# Patient Record
Sex: Female | Born: 1935 | Race: White | Hispanic: No | Smoking: Never smoker
Health system: Southern US, Community
[De-identification: ages and names within clinical notes are randomized; demographics above are authoritative.]

## PROBLEM LIST (undated history)

## (undated) DIAGNOSIS — I739 Peripheral vascular disease, unspecified: Secondary | ICD-10-CM

## (undated) DIAGNOSIS — K589 Irritable bowel syndrome without diarrhea: Secondary | ICD-10-CM

## (undated) DIAGNOSIS — M545 Low back pain, unspecified: Secondary | ICD-10-CM

## (undated) DIAGNOSIS — E785 Hyperlipidemia, unspecified: Secondary | ICD-10-CM

## (undated) DIAGNOSIS — K219 Gastro-esophageal reflux disease without esophagitis: Secondary | ICD-10-CM

## (undated) DIAGNOSIS — M48062 Spinal stenosis, lumbar region with neurogenic claudication: Secondary | ICD-10-CM

## (undated) DIAGNOSIS — M549 Dorsalgia, unspecified: Secondary | ICD-10-CM

## (undated) DIAGNOSIS — I10 Essential (primary) hypertension: Secondary | ICD-10-CM

## (undated) DIAGNOSIS — G8929 Other chronic pain: Secondary | ICD-10-CM

## (undated) DIAGNOSIS — K5790 Diverticulosis of intestine, part unspecified, without perforation or abscess without bleeding: Secondary | ICD-10-CM

## (undated) HISTORY — DX: Peripheral vascular disease, unspecified: I73.9

## (undated) HISTORY — DX: Low back pain, unspecified: M54.50

## (undated) HISTORY — DX: Spinal stenosis, lumbar region with neurogenic claudication: M48.062

## (undated) HISTORY — DX: Irritable bowel syndrome, unspecified: K58.9

## (undated) HISTORY — PX: APPENDECTOMY: SHX54

## (undated) HISTORY — DX: Dorsalgia, unspecified: M54.9

## (undated) HISTORY — DX: Other chronic pain: G89.29

## (undated) HISTORY — DX: Diverticulosis of intestine, part unspecified, without perforation or abscess without bleeding: K57.90

## (undated) HISTORY — DX: Gastro-esophageal reflux disease without esophagitis: K21.9

## (undated) HISTORY — DX: Hyperlipidemia, unspecified: E78.5

## (undated) HISTORY — DX: Essential (primary) hypertension: I10

## (undated) HISTORY — DX: Low back pain: M54.5

---

## 1978-05-11 HISTORY — PX: BREAST BIOPSY: SHX20

## 2009-07-31 ENCOUNTER — Ambulatory Visit: Payer: Self-pay | Admitting: Internal Medicine

## 2010-01-29 ENCOUNTER — Ambulatory Visit: Payer: Self-pay | Admitting: Rheumatology

## 2011-04-30 ENCOUNTER — Ambulatory Visit: Payer: Self-pay | Admitting: Internal Medicine

## 2011-05-22 ENCOUNTER — Ambulatory Visit: Payer: Self-pay | Admitting: Rheumatology

## 2011-06-02 ENCOUNTER — Ambulatory Visit: Payer: Self-pay | Admitting: Rheumatology

## 2011-10-19 ENCOUNTER — Ambulatory Visit: Payer: Self-pay | Admitting: Ophthalmology

## 2012-05-25 ENCOUNTER — Ambulatory Visit: Payer: Self-pay | Admitting: Internal Medicine

## 2012-10-17 ENCOUNTER — Ambulatory Visit: Payer: Self-pay | Admitting: Ophthalmology

## 2013-12-04 ENCOUNTER — Ambulatory Visit: Payer: Self-pay | Admitting: Internal Medicine

## 2014-03-22 DIAGNOSIS — I773 Arterial fibromuscular dysplasia: Secondary | ICD-10-CM | POA: Insufficient documentation

## 2014-08-31 NOTE — Op Note (Signed)
PATIENT NAME:  Christina Mooney, Christina Mooney MR#:  [id number] DATE OF BIRTH:  [birth year]  DATE OF PROCEDURE:  10/17/2012  PREOPERATIVE DIAGNOSIS:  Cataract, left eye.    POSTOPERATIVE DIAGNOSIS:  Cataract, left eye.  PROCEDURE PERFORMED:  Extracapsular cataract extraction using phacoemulsification with placement of an Alcon SN6CWS, 22.0-diopter posterior chamber lens, serial #[id number].  SURGEON:  Maylon PeppersSteven A. Maite Burlison, MD  ASSISTANT:  None.  ANESTHESIA:  4% lidocaine and 0.75% Marcaine in a 50/50 mixture with 10 units/mL of Hylenex added, given as a peribulbar.   ANESTHESIOLOGIST:  Dr. Dimple Caseyice.  COMPLICATIONS:  None.  ESTIMATED BLOOD LOSS:  Less than 1 ml.  DESCRIPTION OF PROCEDURE:  The patient was brought to the operating room and given a peribulbar block.  The patient was then prepped and draped in the usual fashion.  The vertical rectus muscles were imbricated using 5-0 silk sutures.  These sutures were then clamped to the sterile drapes as bridle sutures.  A limbal peritomy was performed extending two clock hours and hemostasis was obtained with cautery.  A partial thickness scleral groove was made at the surgical limbus and dissected anteriorly in a lamellar dissection using an Alcon crescent knife.  The anterior chamber was entered supero-temporally with a Superblade and through the lamellar dissection with a 2.6 mm keratome.  DisCoVisc was used to replace the aqueous and a continuous tear capsulorrhexis was carried out.  Hydrodissection and hydrodelineation were carried out with balanced salt and a 27 gauge canula.  The nucleus was rotated to confirm the effectiveness of the hydrodissection.  Phacoemulsification was carried out using a divide-and-conquer technique.  Total ultrasound time was 1 minute and 15 seconds with an average power of 22.7 percent and CDE of 26.39.  Irrigation/aspiration was used to remove the residual cortex.  DisCoVisc was used to inflate the capsule and the internal  incision was enlarged to 3 mm with the crescent knife.  The intraocular lens was folded and inserted into the capsular bag using the AcrySert delivery system.  Irrigation/aspiration was used to remove the residual DisCoVisc.  Miostat was injected into the anterior chamber through the paracentesis track to inflate the anterior chamber and induce miosis.  A tenth of a milliliter of cefuroxime was placed into the anterior chamber via the paracentesis tract. The wound was checked for leaks and none were found. The conjunctiva was closed with cautery and the bridle sutures were removed.  Two drops of 0.3% Vigamox were placed on the eye.   An eye shield was placed on the eye.  The patient was discharged to the recovery room in good condition. ____________________________ Maylon PeppersSteven A. Terianna Peggs, MD sad:sb D: 10/17/2012 13:56:20 ET T: 10/17/2012 14:26:22 ET JOB#: [id number]  cc: Viviann SpareSteven A. Asuzena Weis, MD, <Dictator> Erline LevineSTEVEN A Sherilynn Dieu MD ELECTRONICALLY SIGNED 10/24/2012 13:32

## 2014-09-02 NOTE — Op Note (Signed)
PATIENT NAME:  Christina BarriosCHILDS, Mihaela S MR#:  [id number] DATE OF BIRTH:  1925/07/24  DATE OF PROCEDURE:  10/19/2011  PREOPERATIVE DIAGNOSIS: Cataract, right eye.   POSTOPERATIVE DIAGNOSIS: Cataract, right eye.   PROCEDURE PERFORMED: Extracapsular cataract extraction using phacoemulsification with placement of an Alcon  SN6CWS, 21.5-diopter posterior chamber lens, serial # [id number].   SURGEON: Maylon PeppersSteven A. Shalia Bartko, M.D.   ANESTHESIA: 4% lidocaine and 0.75% Marcaine in a 50-50 mixture with 10 units/mL of Hylenex added, given as a peribulbar.   ANESTHESIOLOGIST: Dr. Dimple Caseyice   COMPLICATIONS: None.   ESTIMATED BLOOD LOSS: Less than 1 mL.   DESCRIPTION OF PROCEDURE:  The patient was brought to the operating room and given a peribulbar block.  The patient was then prepped and draped in the usual fashion.  The vertical rectus muscles were imbricated using 5-0 silk sutures.  These sutures were then clamped to the sterile drapes as bridle sutures.  A limbal peritomy was performed extending two clock hours and hemostasis was obtained with cautery.  A partial thickness scleral groove was made at the surgical limbus and dissected anteriorly in a lamellar dissection using an Alcon crescent knife.  The anterior chamber was entered superonasally with a Superblade and through the lamellar dissection with a 2.6 mm keratome.  DisCoVisc was used to replace the aqueous and a continuous tear capsulorrhexis was carried out.  Hydrodissection and hydrodelineation were carried out with balanced salt and a 27 gauge canula.  The nucleus was rotated to confirm the effectiveness of the hydrodissection.  Phacoemulsification was carried out using a divide-and-conquer technique.  Total ultrasound time was 1 minute and 42 seconds with an average power of 16.7 percent. CDE 28.67.  Irrigation/aspiration was used to remove the residual cortex.  DisCoVisc was used to inflate the capsule and the internal incision was enlarged to 3 mm  with the crescent knife.  The intraocular lens was folded and inserted into the capsular bag using the Acrysert delivery system.  Irrigation/aspiration was used to remove the residual DisCoVisc.  Miostat was injected into the anterior chamber through the paracentesis track to inflate the anterior chamber and induce miosis.  The wound was checked for leaks and none were found. The conjunctiva was closed with cautery and the bridle sutures were removed.  Two drops of 0.3% Vigamox were placed on the eye.   An eye shield was placed on the eye.  The patient was discharged to the recovery room in good condition.  ____________________________ Maylon PeppersSteven A. Amberlea Spagnuolo, MD sad:bjt D: 10/19/2011 13:19:01 ET T: 10/19/2011 14:00:27 ET JOB#: [id number]  cc: Viviann SpareSteven A. Freyja Govea, MD, <Dictator> Erline LevineSTEVEN A Chosen Garron MD ELECTRONICALLY SIGNED 10/26/2011 13:01

## 2014-11-06 ENCOUNTER — Other Ambulatory Visit: Payer: Self-pay | Admitting: Internal Medicine

## 2014-11-06 DIAGNOSIS — Z1231 Encounter for screening mammogram for malignant neoplasm of breast: Secondary | ICD-10-CM

## 2014-12-07 ENCOUNTER — Other Ambulatory Visit: Payer: Self-pay | Admitting: Internal Medicine

## 2014-12-07 ENCOUNTER — Ambulatory Visit
Admission: RE | Admit: 2014-12-07 | Discharge: 2014-12-07 | Disposition: A | Discharge status: Home / Self Care | Payer: Medicare Other | Source: Ambulatory Visit | Attending: Internal Medicine | Admitting: Internal Medicine

## 2014-12-07 DIAGNOSIS — Z1231 Encounter for screening mammogram for malignant neoplasm of breast: Secondary | ICD-10-CM

## 2015-01-07 ENCOUNTER — Other Ambulatory Visit: Payer: Self-pay | Admitting: Physical Medicine and Rehabilitation

## 2015-01-07 DIAGNOSIS — M5416 Radiculopathy, lumbar region: Secondary | ICD-10-CM

## 2015-01-10 ENCOUNTER — Ambulatory Visit
Admission: RE | Admit: 2015-01-10 | Discharge: 2015-01-10 | Disposition: A | Discharge status: Home / Self Care | Payer: Medicare Other | Source: Ambulatory Visit | Attending: Physical Medicine and Rehabilitation | Admitting: Physical Medicine and Rehabilitation

## 2015-01-10 DIAGNOSIS — M79605 Pain in left leg: Secondary | ICD-10-CM | POA: Diagnosis present

## 2015-01-10 DIAGNOSIS — M5136 Other intervertebral disc degeneration, lumbar region: Secondary | ICD-10-CM | POA: Insufficient documentation

## 2015-01-10 DIAGNOSIS — M545 Low back pain: Secondary | ICD-10-CM | POA: Diagnosis present

## 2015-01-10 DIAGNOSIS — M5416 Radiculopathy, lumbar region: Secondary | ICD-10-CM

## 2015-01-10 DIAGNOSIS — M79604 Pain in right leg: Secondary | ICD-10-CM | POA: Diagnosis present

## 2015-01-10 DIAGNOSIS — M4806 Spinal stenosis, lumbar region: Secondary | ICD-10-CM | POA: Diagnosis not present

## 2015-02-05 DIAGNOSIS — M51369 Other intervertebral disc degeneration, lumbar region without mention of lumbar back pain or lower extremity pain: Secondary | ICD-10-CM | POA: Insufficient documentation

## 2015-02-05 DIAGNOSIS — M48062 Spinal stenosis, lumbar region with neurogenic claudication: Secondary | ICD-10-CM | POA: Insufficient documentation

## 2015-02-05 DIAGNOSIS — M5416 Radiculopathy, lumbar region: Secondary | ICD-10-CM | POA: Insufficient documentation

## 2015-02-05 DIAGNOSIS — M5136 Other intervertebral disc degeneration, lumbar region: Secondary | ICD-10-CM | POA: Insufficient documentation

## 2015-03-28 DIAGNOSIS — R002 Palpitations: Secondary | ICD-10-CM | POA: Insufficient documentation

## 2015-12-28 ENCOUNTER — Encounter (INDEPENDENT_AMBULATORY_CARE_PROVIDER_SITE_OTHER): Payer: Self-pay

## 2015-12-28 DIAGNOSIS — I6529 Occlusion and stenosis of unspecified carotid artery: Secondary | ICD-10-CM

## 2015-12-28 DIAGNOSIS — I773 Arterial fibromuscular dysplasia: Secondary | ICD-10-CM | POA: Insufficient documentation

## 2015-12-28 DIAGNOSIS — I700 Atherosclerosis of aorta: Secondary | ICD-10-CM

## 2015-12-28 DIAGNOSIS — K219 Gastro-esophageal reflux disease without esophagitis: Secondary | ICD-10-CM | POA: Insufficient documentation

## 2015-12-28 DIAGNOSIS — E782 Mixed hyperlipidemia: Secondary | ICD-10-CM | POA: Insufficient documentation

## 2015-12-28 DIAGNOSIS — E785 Hyperlipidemia, unspecified: Secondary | ICD-10-CM

## 2015-12-28 DIAGNOSIS — I10 Essential (primary) hypertension: Secondary | ICD-10-CM

## 2016-03-04 ENCOUNTER — Other Ambulatory Visit (INDEPENDENT_AMBULATORY_CARE_PROVIDER_SITE_OTHER): Payer: Self-pay | Admitting: Vascular Surgery

## 2016-03-04 DIAGNOSIS — I672 Cerebral atherosclerosis: Secondary | ICD-10-CM

## 2016-03-04 DIAGNOSIS — I773 Arterial fibromuscular dysplasia: Secondary | ICD-10-CM

## 2016-03-05 ENCOUNTER — Encounter (INDEPENDENT_AMBULATORY_CARE_PROVIDER_SITE_OTHER): Payer: Self-pay | Admitting: Vascular Surgery

## 2016-03-05 ENCOUNTER — Ambulatory Visit (INDEPENDENT_AMBULATORY_CARE_PROVIDER_SITE_OTHER): Payer: Medicare Other

## 2016-03-05 ENCOUNTER — Ambulatory Visit (INDEPENDENT_AMBULATORY_CARE_PROVIDER_SITE_OTHER): Payer: Medicare Other | Admitting: Vascular Surgery

## 2016-03-05 VITALS — BP 167/76 | HR 75 | Resp 16 | Ht 59.0 in | Wt 106.0 lb

## 2016-03-05 DIAGNOSIS — I672 Cerebral atherosclerosis: Secondary | ICD-10-CM

## 2016-03-05 DIAGNOSIS — I773 Arterial fibromuscular dysplasia: Secondary | ICD-10-CM | POA: Diagnosis not present

## 2016-03-05 DIAGNOSIS — I6523 Occlusion and stenosis of bilateral carotid arteries: Secondary | ICD-10-CM

## 2016-03-05 DIAGNOSIS — I10 Essential (primary) hypertension: Secondary | ICD-10-CM

## 2016-03-05 DIAGNOSIS — I700 Atherosclerosis of aorta: Secondary | ICD-10-CM

## 2016-03-18 NOTE — Progress Notes (Signed)
[facility] Admission History & Physical  MRN : [id number]  Christina Mooney is a 82 y.o. (02/11/1926) female who presents with chief complaint of  Chief Complaint  Patient presents with  . Follow-up  .  History of Present Illness: The patient is seen for follow up evaluation of carotid stenosis. The carotid stenosis followed by ultrasound.   The patient denies amaurosis fugax. There is no recent history of TIA symptoms or focal motor deficits. There is no prior documented CVA.  The patient is taking enteric-coated aspirin 81 mg daily.  There is no history of migraine headaches. There is no history of seizures.  The patient has a history of coronary artery disease, no recent episodes of angina or shortness of breath. The patient denies PAD or claudication symptoms. There is a history of hyperlipidemia which is being treated with a statin.    Carotid Duplex done today shows 1-39% stenosis of the right internal carotid artery and 40-59% stenosis of the left internal carotid artery. Changes consistent with F M.D. are again noted.  No change compared to last study in 02/07/2015  Current Meds  Medication Sig  . atorvastatin (LIPITOR) 10 MG tablet Take 10 mg by mouth daily.  . cholecalciferol (VITAMIN D) 1000 units tablet Take 1,000 Units by mouth daily.  . clopidogrel (PLAVIX) 75 MG tablet Take 75 mg by mouth daily.  Marland Kitchen. esomeprazole (NEXIUM) 20 MG capsule Take 20 mg by mouth daily at 12 noon.  . gabapentin (NEURONTIN) 100 MG capsule Take 100 mg by mouth at bedtime.  . metoprolol tartrate (LOPRESSOR) 25 MG tablet Take 25 mg by mouth once.  . valsartan (DIOVAN) 80 MG tablet daily.    Past Medical History:  Diagnosis Date  . Chronic back pain   . Diverticulosis   . GERD (gastroesophageal reflux disease)   . Hyperlipidemia   . Hypertension   . Irritable bowel syndrome   . Lumbago   . Peripheral arterial disease (HCC)   . Spinal stenosis of lumbar region with  neurogenic claudication     Past Surgical History:  Procedure Laterality Date  . APPENDECTOMY    . BREAST BIOPSY Right 1980   neg    Social History Social History  Substance Use Topics  . Smoking status: Never Smoker  . Smokeless tobacco: Never Used  . Alcohol use No    Family History Family History  Problem Relation Age of Onset  . Congestive Heart Failure Mother   . Cerebrovascular Accident Father   No family history of bleeding/clotting disorders, porphyria or autoimmune disease   Allergies  Allergen Reactions  . Codeine      REVIEW OF SYSTEMS (Negative unless checked)  Constitutional: [] Weight loss  [] Fever  [] Chills Cardiac: [] Chest pain   [] Chest pressure   [] Palpitations   [] Shortness of breath when laying flat   [] Shortness of breath with exertion. Vascular:  [] Pain in legs with walking   [] Pain in legs at rest  [] History of DVT   [] Phlebitis   [] Swelling in legs   [] Varicose veins   [] Non-healing ulcers Pulmonary:   [] Uses home oxygen   [] Productive cough   [] Hemoptysis   [] Wheeze  [] COPD   [] Asthma Neurologic:  [x] Dizziness   [] Seizures   [] History of stroke   [] History of TIA  [] Aphasia   [x] Vissual changes   [] Weakness or numbness in arm   [] Weakness or numbness in leg Musculoskeletal:   [] Joint swelling   [] Joint pain   [] Low back  pain Hematologic:  [] Easy bruising  [] Easy bleeding   [] Hypercoagulable state   [] Anemic Gastrointestinal:  [] Diarrhea   [] Vomiting  [] Gastroesophageal reflux/heartburn   [] Difficulty swallowing. Genitourinary:  [] Chronic kidney disease   [] Difficult urination  [] Frequent urination   [] Blood in urine Skin:  [] Rashes   [] Ulcers  Psychological:  [] History of anxiety   []  History of major depression.  Physical Examination  Vitals:   03/05/16 1214 03/05/16 1215  BP: (!) 173/78 (!) 167/76  Pulse: 75   Resp: 16   Weight: 48.1 kg (106 lb)   Height: 4\' 11"  (1.499 m)    Body mass index is 21.41 kg/m. Gen: WD/WN, NAD Head:  [place]/AT, No temporalis wasting.  Ear/Nose/Throat: Hearing grossly intact, nares w/o erythema or drainage, poor dentition Eyes: PER, EOMI, sclera nonicteric.  Neck: Supple, no masses.  No bruit or JVD.  Pulmonary:  Good air movement, clear to auscultation bilaterally, no use of accessory muscles.  Cardiac: RRR, normal S1, S2, no Murmurs. Vascular: left carotid bruit Vessel Right Left  Radial Palpable Palpable  Ulnar Palpable Palpable  Brachial Palpable Palpable  Carotid Palpable Palpable  Gastrointestinal: soft, non-distended. No guarding/no peritoneal signs.  Musculoskeletal: M/S 5/5 throughout.  No deformity or atrophy.  Neurologic: CN 2-12 intact. Pain and light touch intact in extremities.  Symmetrical.  Speech is fluent. Motor exam as listed above. Psychiatric: Judgment intact, Mood & affect appropriate for pt's clinical situation. Dermatologic: No rashes or ulcers noted.  No changes consistent with cellulitis. Lymph : No Cervical lymphadenopathy, no lichenification or skin changes of chronic lymphedema.  CBC No results found for: WBC, HGB, HCT, MCV, PLT  BMET No results found for: NA, K, CL, CO2, GLUCOSE, BUN, CREATININE, CALCIUM, GFRNONAA, GFRAA CrCl cannot be calculated (No order found.).  COAG No results found for: INR, PROTIME  Radiology No results found.  Assessment/Plan 1. Bilateral carotid artery stenosis Recommend:  Given the patient's asymptomatic subcritical stenosis no further invasive testing or surgery at this time.  Duplex ultrasound shows <60% stenosis bilaterally.  Continue antiplatelet therapy as prescribed Continue management of CAD, HTN and Hyperlipidemia Healthy heart diet,  encouraged exercise at least 4 times per week Follow up in 12 months with duplex ultrasound and physical exam based on the stable >50% stenosis of the LICA carotid artery   - VAS US CAROTID; Future  2.Fibromuscular dysplasia (HCC)  Patient remains asymptomatic  Continue  antiplatelet therapy no surgery at this time   - VAS US CAROTID; Future  3. Aortic atherosclerosis (HCC)  Recommend:  The patient has evidence of mild atherosclerosis of the aorta and lower extremities without claudication.  The patient does not voice lifestyle limiting changes at this point in time.  Noninvasive studies do not suggest clinically significant change.  No invasive studies, angiography or surgery at this time The patient should continue walking and begin a more formal exercise program.  The patient should continue antiplatelet therapy and aggressive treatment of the lipid abnormalities  No changes in the patient's medications at this time  The patient should continue wearing graduated compression socks 10-15 mmHg strength to control the mild edema.    4. Essential hypertension, benign Continue antihypertensive medications as already ordered and reviewed, no changes at this time.    Levora DredgeGregory Ercelle Winkles, MD  03/18/2016 1:34 PM

## 2016-10-22 ENCOUNTER — Other Ambulatory Visit: Payer: Self-pay | Admitting: Ophthalmology

## 2016-10-22 DIAGNOSIS — H532 Diplopia: Secondary | ICD-10-CM

## 2016-10-27 ENCOUNTER — Ambulatory Visit
Admission: RE | Admit: 2016-10-27 | Discharge: 2016-10-27 | Disposition: A | Discharge status: Home / Self Care | Payer: Medicare Other | Source: Ambulatory Visit | Attending: Ophthalmology | Admitting: Ophthalmology

## 2016-10-27 DIAGNOSIS — H532 Diplopia: Secondary | ICD-10-CM | POA: Insufficient documentation

## 2016-10-27 DIAGNOSIS — I739 Peripheral vascular disease, unspecified: Secondary | ICD-10-CM | POA: Insufficient documentation

## 2016-10-27 DIAGNOSIS — G319 Degenerative disease of nervous system, unspecified: Secondary | ICD-10-CM | POA: Diagnosis not present

## 2016-10-27 LAB — POCT I-STAT CREATININE: Creatinine, Ser: 0.6 mg/dL (ref 0.44–1.00)

## 2016-10-27 MED ORDER — GADOBENATE DIMEGLUMINE 529 MG/ML IV SOLN
10.0000 mL | Freq: Once | INTRAVENOUS | Status: AC | PRN
  Administered 2016-10-27: 10 mL via INTRAVENOUS

## 2017-02-08 ENCOUNTER — Emergency Department: Payer: Medicare Other

## 2017-02-08 ENCOUNTER — Encounter: Payer: Self-pay | Admitting: Emergency Medicine

## 2017-02-08 ENCOUNTER — Emergency Department
Admission: EM | Admit: 2017-02-08 | Discharge: 2017-02-08 | Disposition: A | Discharge status: Home / Self Care | Payer: Medicare Other | Attending: Student in an Organized Health Care Education/Training Program | Admitting: Student in an Organized Health Care Education/Training Program

## 2017-02-08 DIAGNOSIS — W010XXA Fall on same level from slipping, tripping and stumbling without subsequent striking against object, initial encounter: Secondary | ICD-10-CM | POA: Diagnosis not present

## 2017-02-08 DIAGNOSIS — Y939 Activity, unspecified: Secondary | ICD-10-CM | POA: Insufficient documentation

## 2017-02-08 DIAGNOSIS — S0990XA Unspecified injury of head, initial encounter: Secondary | ICD-10-CM | POA: Insufficient documentation

## 2017-02-08 DIAGNOSIS — Z7902 Long term (current) use of antithrombotics/antiplatelets: Secondary | ICD-10-CM | POA: Insufficient documentation

## 2017-02-08 DIAGNOSIS — Z79899 Other long term (current) drug therapy: Secondary | ICD-10-CM | POA: Diagnosis not present

## 2017-02-08 DIAGNOSIS — Y929 Unspecified place or not applicable: Secondary | ICD-10-CM | POA: Insufficient documentation

## 2017-02-08 DIAGNOSIS — I10 Essential (primary) hypertension: Secondary | ICD-10-CM | POA: Diagnosis not present

## 2017-02-08 DIAGNOSIS — Y999 Unspecified external cause status: Secondary | ICD-10-CM | POA: Insufficient documentation

## 2017-02-08 DIAGNOSIS — S299XXA Unspecified injury of thorax, initial encounter: Secondary | ICD-10-CM | POA: Diagnosis present

## 2017-02-08 DIAGNOSIS — M25562 Pain in left knee: Secondary | ICD-10-CM | POA: Diagnosis not present

## 2017-02-08 DIAGNOSIS — S2231XA Fracture of one rib, right side, initial encounter for closed fracture: Secondary | ICD-10-CM | POA: Insufficient documentation

## 2017-02-08 DIAGNOSIS — W19XXXA Unspecified fall, initial encounter: Secondary | ICD-10-CM

## 2017-02-08 MED ORDER — ACETAMINOPHEN 325 MG PO TABS
650.0000 mg | ORAL_TABLET | Freq: Once | ORAL | Status: AC
  Administered 2017-02-08: 650 mg via ORAL
  Filled 2017-02-08: qty 2

## 2017-02-08 MED ORDER — LIDOCAINE 5 % EX PTCH: 1.0000 | MEDICATED_PATCH | Freq: Two times a day (BID) | CUTANEOUS | 0 refills | Status: AC

## 2017-02-08 MED ORDER — LIDOCAINE 5 % EX PTCH
1.0000 | MEDICATED_PATCH | CUTANEOUS | Status: DC
  Administered 2017-02-08: 1 via TRANSDERMAL
  Filled 2017-02-08: qty 1

## 2017-02-08 MED ORDER — TRAMADOL HCL 50 MG PO TABS: 50.0000 mg | ORAL_TABLET | Freq: Four times a day (QID) | ORAL | 0 refills | Status: AC | PRN

## 2017-02-08 MED ORDER — TRAMADOL HCL 50 MG PO TABS
50.0000 mg | ORAL_TABLET | Freq: Once | ORAL | Status: DC
  Filled 2017-02-08: qty 1

## 2017-02-08 NOTE — ED Provider Notes (Signed)
[facility] Emergency Department Provider Note    First MD Initiated Contact with Patient 02/08/17 1541     (approximate)  I have reviewed the triage vital signs and the nursing notes.   HISTORY  Chief Complaint Fall    HPI Christina Mooney is a 83 y.o. female presents with chief complaint of anterior chest wall pain head injury and left knee pain that occurred after mechanical fall. Patient states that she was tripped when she was getting out of the car. Feels that her shoes got tripped up from under her. Denied any chest pain or palpitations prior to the fall. There is no LOC. No blurry vision. States that she does have severe pain when she takes a deep breath.   Past Medical History:  Diagnosis Date  . Chronic back pain   . Diverticulosis   . GERD (gastroesophageal reflux disease)   . Hyperlipidemia   . Hypertension   . Irritable bowel syndrome   . Lumbago   . Peripheral arterial disease (HCC)   . Spinal stenosis of lumbar region with neurogenic claudication    Family History  Problem Relation Age of Onset  . Congestive Heart Failure Mother   . Cerebrovascular Accident Father    Past Surgical History:  Procedure Laterality Date  . APPENDECTOMY    . BREAST BIOPSY Right 1980   neg   Patient Active Problem List   Diagnosis Date Noted  . GERD (gastroesophageal reflux disease) 12/28/2015  . Carotid artery stenosis 12/28/2015  . Aortic atherosclerosis (HCC) 12/28/2015  . Hyperlipidemia 12/28/2015  . Essential hypertension, benign 12/28/2015  . Fibromuscular dysplasia (HCC) 12/28/2015      Prior to Admission medications   Medication Sig Start Date End Date Taking? Authorizing Provider  atorvastatin (LIPITOR) 10 MG tablet Take 10 mg by mouth daily.    [provider]  cholecalciferol (VITAMIN D) 1000 units tablet Take 1,000 Units by mouth daily.    [provider]  clopidogrel (PLAVIX) 75 MG tablet Take 75 mg by mouth  daily.    [provider]  esomeprazole (NEXIUM) 20 MG capsule Take 20 mg by mouth daily at 12 noon.    [provider]  gabapentin (NEURONTIN) 100 MG capsule Take 100 mg by mouth at bedtime.    [provider]  losartan (COZAAR) 50 MG tablet Take 50 mg by mouth daily.    [provider]  metoprolol tartrate (LOPRESSOR) 25 MG tablet Take 25 mg by mouth once.    [provider]  valsartan (DIOVAN) 80 MG tablet daily. 03/03/16   [provider]    Allergies Codeine; Garlic; Milk-related compounds; and Sorbitol    Social History Social History  Substance Use Topics  . Smoking status: Never Smoker  . Smokeless tobacco: Never Used  . Alcohol use No    Review of Systems Patient denies headaches, rhinorrhea, blurry vision, numbness, shortness of breath, chest pain, edema, cough, abdominal pain, nausea, vomiting, diarrhea, dysuria, fevers, rashes or hallucinations unless otherwise stated above in HPI. ____________________________________________   PHYSICAL EXAM:  VITAL SIGNS: Vitals:   02/08/17 1544  BP: (!) 177/78  Pulse: 86  Resp: 16  Temp: 97.8 F (36.6 C)  SpO2: 100%    Constitutional: Alert and oriented. Well appearing and in no acute distress. Eyes: Conjunctivae are normal.  Head: Atraumatic. Nose: No congestion/rhinnorhea. Mouth/Throat: Mucous membranes are moist.   Neck: No stridor. Painless ROM.  Cardiovascular: Normal rate, regular rhythm. Grossly normal heart  sounds.  Good peripheral circulation. Respiratory: Normal respiratory effort.  No retractions. Lungs CTAB.  + anterior chest wall pain Gastrointestinal: Soft and nontender. No distention. No abdominal bruits. No CVA tenderness. Musculoskeletal: No lower extremity tenderness nor edema.  No joint effusions. ttp of left patella without deformity or effusion Neurologic:  Normal speech and language. No gross focal neurologic deficits are appreciated. No facial  droop Skin:  Skin is warm, dry and intact. No rash noted. Psychiatric: Mood and affect are normal. Speech and behavior are normal.  ____________________________________________   LABS (all labs ordered are listed, but only abnormal results are displayed)  No results found for this or any previous visit (from the past 24 hour(s)). ____________________________________________ ____________________________________________  RADIOLOGY  I personally reviewed all radiographic images ordered to evaluate for the above acute complaints and reviewed radiology reports and findings.  These findings were personally discussed with the patient.  Please see medical record for radiology report.  ____________________________________________   PROCEDURES  Procedure(s) performed:  Procedures    Critical Care performed: no ____________________________________________   INITIAL IMPRESSION / ASSESSMENT AND PLAN / ED COURSE  Pertinent labs & imaging results that were available during my care of the patient were reviewed by me and considered in my medical decision making (see chart for details).  DDX: iph, sah, fracture, ptx, hemothorax  Christina Mooney is a 82 y.o. who presents to the ED with traumatic injury as described above. She is otherwise well-appearing. Describes episodes. Mechanical fall. CT imaging and radiographs ordered for the above differential show evidence of nondisplaced rib fracture but no evidence of pneumothorax or hemothorax. No evidence of acute intracranial abnormality evidence of cervical fracture. Patient provided was considered spirometer and pain management. Discussed strict return precautions.      ____________________________________________   FINAL CLINICAL IMPRESSION(S) / ED DIAGNOSES  Final diagnoses:  Fall, initial encounter  Closed fracture of one rib of right side, initial encounter      NEW MEDICATIONS STARTED DURING THIS VISIT:  New Prescriptions     No medications on file     Note:  This document was prepared using Dragon voice recognition software and may include unintentional dictation errors.    Willy Eddy, MD 02/08/17 2022

## 2017-02-08 NOTE — ED Notes (Signed)

## 2017-02-08 NOTE — ED Triage Notes (Signed)
Pt presents to ED [facility] via EMS from [facility] with a mechanical fall when the patient tripped getting out of the car; fall occurred around 1500 today; pt c/o pain in the left knee, left side of the face and sternal area; pt states pain most severe when trying to catch a deep breath; pt is awake, alert and oriented x4.

## 2017-03-11 ENCOUNTER — Ambulatory Visit (INDEPENDENT_AMBULATORY_CARE_PROVIDER_SITE_OTHER): Payer: Medicare Other

## 2017-03-11 ENCOUNTER — Encounter (INDEPENDENT_AMBULATORY_CARE_PROVIDER_SITE_OTHER): Payer: Self-pay

## 2017-03-11 ENCOUNTER — Encounter (INDEPENDENT_AMBULATORY_CARE_PROVIDER_SITE_OTHER): Payer: Self-pay | Admitting: Vascular Surgery

## 2017-03-11 ENCOUNTER — Ambulatory Visit (INDEPENDENT_AMBULATORY_CARE_PROVIDER_SITE_OTHER): Payer: Medicare Other | Admitting: Vascular Surgery

## 2017-03-11 VITALS — BP 139/65 | HR 71 | Resp 14 | Ht 59.0 in | Wt 102.0 lb

## 2017-03-11 DIAGNOSIS — E785 Hyperlipidemia, unspecified: Secondary | ICD-10-CM | POA: Diagnosis not present

## 2017-03-11 DIAGNOSIS — I6523 Occlusion and stenosis of bilateral carotid arteries: Secondary | ICD-10-CM | POA: Diagnosis not present

## 2017-03-11 DIAGNOSIS — I773 Arterial fibromuscular dysplasia: Secondary | ICD-10-CM

## 2017-03-11 NOTE — Progress Notes (Signed)
Subjective:    Patient ID: Christina Mooney, female    DOB: [birth year], 83 y.o.   MRN: [id number] Chief Complaint  Patient presents with  . Carotid    6 month carotid follow up   Patient presents for a yearly non-invasive study follow up for carotid stenosis. The stenosis has been followed by surveillance duplexes. The patient underwent a bilateral carotid duplex scan which showed no change from the previous exam on 03/05/16. Duplex is stable at 1-39% ICA stenosis bilaterally. The patient denies experiencing Amaurosis Fugax, TIA like symptoms or focal motor deficits.    Review of Systems  Constitutional: Negative.   HENT: Negative.   Eyes: Negative.   Respiratory: Negative.   Cardiovascular: Negative.   Gastrointestinal: Negative.   Endocrine: Negative.   Genitourinary: Negative.   Musculoskeletal: Negative.   Skin: Negative.   Allergic/Immunologic: Negative.   Neurological: Negative.   Hematological: Negative.   Psychiatric/Behavioral: Negative.       Objective:   Physical Exam  Constitutional: She is oriented to person, place, and time. She appears well-developed and well-nourished.  HENT:  Head: Normocephalic and atraumatic.  Eyes: Pupils are equal, round, and reactive to light. Conjunctivae are normal.  Neck: Normal range of motion.  No carotid bruits noted on exam  Cardiovascular: Normal rate, regular rhythm, normal heart sounds and intact distal pulses.   Pulses:      Radial pulses are 2+ on the right side, and 2+ on the left side.  Pulmonary/Chest: Effort normal.  Musculoskeletal: Normal range of motion. She exhibits no edema.  Neurological: She is alert and oriented to person, place, and time.  Skin: Skin is warm and dry. She is not diaphoretic.  Psychiatric: She has a normal mood and affect. Her behavior is normal. Judgment and thought content normal.  Vitals reviewed.  BP 139/65 (BP Location: Left Arm)   Pulse 71   Resp 14   Ht 4\' 11"  (1.499 m)   Wt 102  lb (46.3 kg)   BMI 20.60 kg/m   Past Medical History:  Diagnosis Date  . Chronic back pain   . Diverticulosis   . GERD (gastroesophageal reflux disease)   . Hyperlipidemia   . Hypertension   . Irritable bowel syndrome   . Lumbago   . Peripheral arterial disease (HCC)   . Spinal stenosis of lumbar region with neurogenic claudication    Social History   Social History  . Marital status: Widowed    Spouse name: N/A  . Number of children: N/A  . Years of education: N/A   Occupational History  . Not on file.   Social History Main Topics  . Smoking status: Never Smoker  . Smokeless tobacco: Never Used  . Alcohol use No  . Drug use: No  . Sexual activity: No   Other Topics Concern  . Not on file   Social History Narrative  . No narrative on file   Past Surgical History:  Procedure Laterality Date  . APPENDECTOMY    . BREAST BIOPSY Right 1980   neg    Family History  Problem Relation Age of Onset  . Congestive Heart Failure Mother   . Cerebrovascular Accident Father    Allergies  Allergen Reactions  . Codeine   . Garlic   . Milk-Related Compounds   . Sorbitol       Assessment & Plan:  Patient presents for a yearly non-invasive study follow up for carotid stenosis. The stenosis has been followed by  surveillance duplexes. The patient underwent a bilateral carotid duplex scan which showed no change from the previous exam on 03/05/16. Duplex is stable at 1-39% ICA stenosis bilaterally. The patient denies experiencing Amaurosis Fugax, TIA like symptoms or focal motor deficits.   1. Bilateral carotid artery stenosis - Stable Studies reviewed with patient. Patient asymptomatic with stable duplex. No intervention at this time. Patient to return in one year for surveillance carotid duplex. Patient to continue medical optimization with ASA and dyslipidemia medication. Patient to remain abstinent of tobacco use. I have discussed with the patient at length the risk  factors for and pathogenesis of atherosclerotic disease and encouraged a healthy diet, regular exercise regimen and blood pressure / glucose control.  Patient was instructed to contact our office in the interim with problems such as arm / leg weakness or numbness, speech / swallowing difficulty or temporary monocular blindness. The patient expresses their understanding.  - VAS US CAROTID; Future  2. Fibromuscular dysplasia (HCC) - Stable Contributing factor to the patient's carotid stenosis The patient's carotid stenosis is stable.  3. Hyperlipidemia, unspecified hyperlipidemia type - Stable Encouraged good control as its slows the progression of atherosclerotic disease  Current Outpatient Prescriptions on File Prior to Visit  Medication Sig Dispense Refill  . atorvastatin (LIPITOR) 10 MG tablet Take 10 mg by mouth daily.    . cholecalciferol (VITAMIN D) 1000 units tablet Take 1,000 Units by mouth daily.    . clopidogrel (PLAVIX) 75 MG tablet Take 75 mg by mouth daily.    Marland Kitchen. esomeprazole (NEXIUM) 20 MG capsule Take 20 mg by mouth daily at 12 noon.    . gabapentin (NEURONTIN) 100 MG capsule Take 100 mg by mouth at bedtime.    . lidocaine (LIDODERM) 5 % Place 1 patch onto the skin every 12 (twelve) hours. Remove & Discard patch within 12 hours or as directed by MD 10 patch 0  . losartan (COZAAR) 50 MG tablet Take 50 mg by mouth daily.    . traMADol (ULTRAM) 50 MG tablet Take 1 tablet (50 mg total) by mouth every 6 (six) hours as needed. 5 tablet 0  . valsartan (DIOVAN) 80 MG tablet daily.     No current facility-administered medications on file prior to visit.     There are no Patient Instructions on file for this visit. No Follow-up on file.   Aalijah Mims A Eero Dini, PA-C

## 2017-12-16 ENCOUNTER — Other Ambulatory Visit (INDEPENDENT_AMBULATORY_CARE_PROVIDER_SITE_OTHER): Payer: Self-pay | Admitting: Vascular Surgery

## 2018-03-11 ENCOUNTER — Encounter (INDEPENDENT_AMBULATORY_CARE_PROVIDER_SITE_OTHER): Payer: Medicare Other

## 2018-03-14 ENCOUNTER — Encounter (INDEPENDENT_AMBULATORY_CARE_PROVIDER_SITE_OTHER): Payer: Medicare Other

## 2018-03-14 ENCOUNTER — Encounter (INDEPENDENT_AMBULATORY_CARE_PROVIDER_SITE_OTHER): Payer: Self-pay | Admitting: Vascular Surgery

## 2018-03-14 ENCOUNTER — Ambulatory Visit (INDEPENDENT_AMBULATORY_CARE_PROVIDER_SITE_OTHER): Payer: Medicare Other | Admitting: Vascular Surgery

## 2018-03-14 ENCOUNTER — Ambulatory Visit (INDEPENDENT_AMBULATORY_CARE_PROVIDER_SITE_OTHER): Payer: Medicare Other

## 2018-03-14 VITALS — BP 145/72 | HR 76 | Resp 18 | Ht 59.0 in | Wt 93.0 lb

## 2018-03-14 DIAGNOSIS — I6523 Occlusion and stenosis of bilateral carotid arteries: Secondary | ICD-10-CM

## 2018-03-14 DIAGNOSIS — K219 Gastro-esophageal reflux disease without esophagitis: Secondary | ICD-10-CM | POA: Diagnosis not present

## 2018-03-14 DIAGNOSIS — E785 Hyperlipidemia, unspecified: Secondary | ICD-10-CM | POA: Diagnosis not present

## 2018-03-14 DIAGNOSIS — I10 Essential (primary) hypertension: Secondary | ICD-10-CM | POA: Diagnosis not present

## 2018-03-14 DIAGNOSIS — Z7982 Long term (current) use of aspirin: Secondary | ICD-10-CM

## 2018-03-14 NOTE — Progress Notes (Signed)
MRN : [id number]  Christina Mooney is a 84 y.o. (Jun 10, 1925) female who presents with chief complaint of  Chief Complaint  Patient presents with  . Follow-up    1 year Carotid check  .  History of Present Illness:   The patient is seen for follow up evaluation of carotid stenosis. The carotid stenosis followed by ultrasound.   The patient denies amaurosis fugax. There is no recent history of TIA symptoms or focal motor deficits. There is no prior documented CVA.  The patient is taking enteric-coated aspirin 81 mg daily.  There is no history of migraine headaches. There is no history of seizures.  The patient has a history of coronary artery disease, no recent episodes of angina or shortness of breath. The patient denies PAD or claudication symptoms. There is a history of hyperlipidemia which is being treated with a statin.   Carotid Duplex done today shows 40-59% stenosis of the right internal carotid artery and 40-59% stenosis of the left internal carotid artery. Changes consistent with F M.D. are again noted.  No change compared to last study in 03/11/2017  Current Meds  Medication Sig  . atorvastatin (LIPITOR) 10 MG tablet Take 10 mg by mouth daily.  . cholecalciferol (VITAMIN D) 1000 units tablet Take 1,000 Units by mouth daily.  . clopidogrel (PLAVIX) 75 MG tablet TAKE 1 TABLET BY MOUTH DAILY  . diazepam (VALIUM) 2 MG tablet Take by mouth.   . esomeprazole (NEXIUM) 20 MG capsule Take 20 mg by mouth daily at 12 noon.  . gabapentin (NEURONTIN) 100 MG capsule Take 100 mg by mouth at bedtime.  . metoprolol succinate (TOPROL-XL) 25 MG 24 hr tablet Take 12.5 mg by mouth daily.  . valsartan (DIOVAN) 80 MG tablet daily.  . [DISCONTINUED] [place] alk-PHENObarbital (DONNATAL) 16.2 MG tablet TAKE 1 TABLET BY MOUTH EVERY 8 HOURS AS NEEDED  . [DISCONTINUED] losartan (COZAAR) 50 MG tablet Take 50 mg by mouth daily.    Past Medical History:  Diagnosis Date  . Chronic back  pain   . Diverticulosis   . GERD (gastroesophageal reflux disease)   . Hyperlipidemia   . Hypertension   . Irritable bowel syndrome   . Lumbago   . Peripheral arterial disease ([facility])   . Spinal stenosis of lumbar region with neurogenic claudication     Past Surgical History:  Procedure Laterality Date  . APPENDECTOMY    . BREAST BIOPSY Right 1980   neg    Social History Social History   Tobacco Use  . Smoking status: Never Smoker  . Smokeless tobacco: Never Used  Substance Use Topics  . Alcohol use: No  . Drug use: No    Family History Family History  Problem Relation Age of Onset  . Congestive Heart Failure Mother   . Cerebrovascular Accident Father     Allergies  Allergen Reactions  . Codeine   . Garlic   . Milk-Related Compounds   . Sorbitol      REVIEW OF SYSTEMS (Negative unless checked)  Constitutional: [] Weight loss  [] Fever  [] Chills Cardiac: [] Chest pain   [] Chest pressure   [] Palpitations   [] Shortness of breath when laying flat   [] Shortness of breath with exertion. Vascular:  [] Pain in legs with walking   [] Pain in legs at rest  [] History of DVT   [] Phlebitis   [] Swelling in legs   [] Varicose veins   [] Non-healing ulcers Pulmonary:   [] Uses home oxygen   [] Productive cough   []   Hemoptysis   [] Wheeze  [] COPD   [] Asthma Neurologic:  [] Dizziness   [] Seizures   [] History of stroke   [] History of TIA  [] Aphasia   [] Vissual changes   [] Weakness or numbness in arm   [] Weakness or numbness in leg Musculoskeletal:   [] Joint swelling   [] Joint pain   [] Low back pain Hematologic:  [] Easy bruising  [] Easy bleeding   [] Hypercoagulable state   [] Anemic Gastrointestinal:  [] Diarrhea   [] Vomiting  [] Gastroesophageal reflux/heartburn   [] Difficulty swallowing. Genitourinary:  [] Chronic kidney disease   [] Difficult urination  [] Frequent urination   [] Blood in urine Skin:  [] Rashes   [] Ulcers  Psychological:  [] History of anxiety   []  History of major  depression.  Physical Examination  Vitals:   03/14/18 1138 03/14/18 1139  BP: (!) 162/67 (!) 145/72  Pulse: 76   Resp: 18   Weight: 93 lb (42.2 kg)   Height: 4' 11"  (1.499 m)    Body mass index is 18.78 kg/m. Gen: WD/WN, NAD Head: [place]/AT, No temporalis wasting.  Ear/Nose/Throat: Hearing grossly intact, nares w/o erythema or drainage Eyes: PER, EOMI, sclera nonicteric.  Neck: Supple, no large masses.   Pulmonary:  Good air movement, no audible wheezing bilaterally, no use of accessory muscles.  Cardiac: RRR, no JVD Vascular:  Bilateral carotid bruit Vessel Right Left  Radial Palpable Palpable  Ulnar Palpable Palpable  Brachial Palpable Palpable  Carotid Palpable Palpable  Gastrointestinal: Non-distended. No guarding/no peritoneal signs.  Musculoskeletal: M/S 5/5 throughout.  No deformity or atrophy.  Neurologic: CN 2-12 intact. Symmetrical.  Speech is fluent. Motor exam as listed above. Psychiatric: Judgment intact, Mood & affect appropriate for pt's clinical situation. Dermatologic: No rashes or ulcers noted.  No changes consistent with cellulitis. Lymph : No lichenification or skin changes of chronic lymphedema.  CBC No results found for: WBC, HGB, HCT, MCV, PLT  BMET    Component Value Date/Time   CREATININE 0.60 10/27/2016 1458   CrCl cannot be calculated (Patient's most recent lab result is older than the maximum 21 days allowed.).  COAG No results found for: INR, PROTIME  Radiology No results found.   Assessment/Plan 1. Bilateral carotid artery stenosis Recommend:  Given the patient's asymptomatic subcritical stenosis no further invasive testing or surgery at this time.  Duplex ultrasound shows <60% stenosis bilaterally.  Continue antiplatelet therapy as prescribed Continue management of CAD, HTN and Hyperlipidemia Healthy heart diet,  encouraged exercise at least 4 times per week Follow up in 12 months with duplex ultrasound and physical exam  based on the stable 50% stenosis of the LICA carotid artery  - VAS US CAROTID; Future  2. Essential hypertension, benign Continue antihypertensive medications as already ordered, these medications have been reviewed and there are no changes at this time.   3. Gastroesophageal reflux disease, esophagitis presence not specified Continue PPI as already ordered, this medication has been reviewed and there are no changes at this time.  Avoidence of caffeine and alcohol  Moderate elevation of the head of the bed   4. Hyperlipidemia, unspecified hyperlipidemia type Continue antihypertensive medications as already ordered, these medications have been reviewed and there are no changes at this time.     Hortencia Pilar, MD  03/14/2018 12:45 PM

## 2018-05-23 ENCOUNTER — Other Ambulatory Visit: Payer: Self-pay | Admitting: Ophthalmology

## 2018-05-23 DIAGNOSIS — H532 Diplopia: Secondary | ICD-10-CM

## 2018-06-06 ENCOUNTER — Ambulatory Visit
Admission: RE | Admit: 2018-06-06 | Discharge: 2018-06-06 | Disposition: A | Discharge status: Home / Self Care | Payer: Medicare PPO | Source: Ambulatory Visit | Attending: Ophthalmology | Admitting: Ophthalmology

## 2018-06-06 DIAGNOSIS — H532 Diplopia: Secondary | ICD-10-CM | POA: Diagnosis not present

## 2018-06-06 LAB — POCT I-STAT CREATININE: Creatinine, Ser: 0.7 mg/dL (ref 0.44–1.00)

## 2018-06-06 MED ORDER — GADOBUTROL 1 MMOL/ML IV SOLN
4.0000 mL | Freq: Once | INTRAVENOUS | Status: AC | PRN
  Administered 2018-06-06: 4 mL via INTRAVENOUS

## 2018-12-28 ENCOUNTER — Other Ambulatory Visit (INDEPENDENT_AMBULATORY_CARE_PROVIDER_SITE_OTHER): Payer: Self-pay | Admitting: Vascular Surgery

## 2019-02-27 ENCOUNTER — Emergency Department: Payer: Medicare PPO

## 2019-02-27 ENCOUNTER — Encounter: Payer: Self-pay | Admitting: Emergency Medicine

## 2019-02-27 ENCOUNTER — Inpatient Hospital Stay
Admission: EM | Admit: 2019-02-27 | Discharge: 2019-03-02 | DRG: 964 | Disposition: A | Discharge status: Skilled Nursing Facility | Payer: Medicare PPO | Attending: Internal Medicine | Admitting: Internal Medicine

## 2019-02-27 ENCOUNTER — Other Ambulatory Visit: Payer: Self-pay

## 2019-02-27 ENCOUNTER — Inpatient Hospital Stay: Payer: Medicare PPO

## 2019-02-27 DIAGNOSIS — Z888 Allergy status to other drugs, medicaments and biological substances status: Secondary | ICD-10-CM

## 2019-02-27 DIAGNOSIS — Z885 Allergy status to narcotic agent status: Secondary | ICD-10-CM | POA: Diagnosis not present

## 2019-02-27 DIAGNOSIS — Y92012 Bathroom of single-family (private) house as the place of occurrence of the external cause: Secondary | ICD-10-CM | POA: Diagnosis not present

## 2019-02-27 DIAGNOSIS — E860 Dehydration: Secondary | ICD-10-CM | POA: Diagnosis present

## 2019-02-27 DIAGNOSIS — Z7902 Long term (current) use of antithrombotics/antiplatelets: Secondary | ICD-10-CM

## 2019-02-27 DIAGNOSIS — Z91018 Allergy to other foods: Secondary | ICD-10-CM | POA: Diagnosis not present

## 2019-02-27 DIAGNOSIS — N390 Urinary tract infection, site not specified: Secondary | ICD-10-CM | POA: Diagnosis present

## 2019-02-27 DIAGNOSIS — I700 Atherosclerosis of aorta: Secondary | ICD-10-CM | POA: Diagnosis present

## 2019-02-27 DIAGNOSIS — E785 Hyperlipidemia, unspecified: Secondary | ICD-10-CM | POA: Diagnosis present

## 2019-02-27 DIAGNOSIS — Z823 Family history of stroke: Secondary | ICD-10-CM

## 2019-02-27 DIAGNOSIS — Z91011 Allergy to milk products: Secondary | ICD-10-CM | POA: Diagnosis not present

## 2019-02-27 DIAGNOSIS — M48062 Spinal stenosis, lumbar region with neurogenic claudication: Secondary | ICD-10-CM | POA: Diagnosis present

## 2019-02-27 DIAGNOSIS — W19XXXA Unspecified fall, initial encounter: Secondary | ICD-10-CM | POA: Diagnosis present

## 2019-02-27 DIAGNOSIS — S2242XA Multiple fractures of ribs, left side, initial encounter for closed fracture: Secondary | ICD-10-CM

## 2019-02-27 DIAGNOSIS — S270XXA Traumatic pneumothorax, initial encounter: Secondary | ICD-10-CM | POA: Diagnosis present

## 2019-02-27 DIAGNOSIS — Z20828 Contact with and (suspected) exposure to other viral communicable diseases: Secondary | ICD-10-CM | POA: Diagnosis present

## 2019-02-27 DIAGNOSIS — J939 Pneumothorax, unspecified: Secondary | ICD-10-CM | POA: Diagnosis not present

## 2019-02-27 DIAGNOSIS — Z8249 Family history of ischemic heart disease and other diseases of the circulatory system: Secondary | ICD-10-CM | POA: Diagnosis not present

## 2019-02-27 DIAGNOSIS — G8929 Other chronic pain: Secondary | ICD-10-CM | POA: Diagnosis present

## 2019-02-27 DIAGNOSIS — K219 Gastro-esophageal reflux disease without esophagitis: Secondary | ICD-10-CM | POA: Diagnosis present

## 2019-02-27 DIAGNOSIS — Z79899 Other long term (current) drug therapy: Secondary | ICD-10-CM | POA: Diagnosis not present

## 2019-02-27 DIAGNOSIS — K5790 Diverticulosis of intestine, part unspecified, without perforation or abscess without bleeding: Secondary | ICD-10-CM | POA: Diagnosis present

## 2019-02-27 DIAGNOSIS — Z09 Encounter for follow-up examination after completed treatment for conditions other than malignant neoplasm: Secondary | ICD-10-CM

## 2019-02-27 DIAGNOSIS — S32502A Unspecified fracture of left pubis, initial encounter for closed fracture: Secondary | ICD-10-CM

## 2019-02-27 DIAGNOSIS — W0110XA Fall on same level from slipping, tripping and stumbling with subsequent striking against unspecified object, initial encounter: Secondary | ICD-10-CM | POA: Diagnosis present

## 2019-02-27 DIAGNOSIS — I739 Peripheral vascular disease, unspecified: Secondary | ICD-10-CM | POA: Diagnosis present

## 2019-02-27 DIAGNOSIS — I10 Essential (primary) hypertension: Secondary | ICD-10-CM | POA: Diagnosis present

## 2019-02-27 LAB — BASIC METABOLIC PANEL
Anion gap: 10 (ref 5–15)
BUN: 12 mg/dL (ref 8–23)
CO2: 23 mmol/L (ref 22–32)
Calcium: 9.3 mg/dL (ref 8.9–10.3)
Chloride: 107 mmol/L (ref 98–111)
Creatinine, Ser: 0.52 mg/dL (ref 0.44–1.00)
GFR calc Af Amer: >60 mL/min (ref >60)
GFR calc non Af Amer: >60 mL/min (ref >60)
Glucose, Bld: 130 mg/dL — ABNORMAL HIGH (ref 70–99)
Potassium: 3.3 mmol/L — ABNORMAL LOW (ref 3.5–5.1)
Sodium: 140 mmol/L (ref 135–145)

## 2019-02-27 LAB — CBC WITH DIFFERENTIAL/PLATELET
Abs Immature Granulocytes: 0.23 10*3/uL — ABNORMAL HIGH (ref 0.00–0.07)
Abs Immature Granulocytes: 0.12 10*3/uL — ABNORMAL HIGH (ref 0.00–0.07)
Basophils Absolute: 0.1 10*3/uL (ref 0.0–0.1)
Basophils Absolute: 0 10*3/uL (ref 0.0–0.1)
Basophils Relative: 0 %
Basophils Relative: 0 %
Eosinophils Absolute: 0 10*3/uL (ref 0.0–0.5)
Eosinophils Absolute: 0 10*3/uL (ref 0.0–0.5)
Eosinophils Relative: 0 %
Eosinophils Relative: 0 %
HCT: 38.9 % (ref 36.0–46.0)
HCT: 33.3 % — ABNORMAL LOW (ref 36.0–46.0)
Hemoglobin: 12.7 g/dL (ref 12.0–15.0)
Hemoglobin: 11.2 g/dL — ABNORMAL LOW (ref 12.0–15.0)
Immature Granulocytes: 1 %
Immature Granulocytes: 1 %
Lymphocytes Relative: 6 %
Lymphocytes Relative: 3 %
Lymphs Abs: 1.2 10*3/uL (ref 0.7–4.0)
Lymphs Abs: 0.5 10*3/uL — ABNORMAL LOW (ref 0.7–4.0)
MCH: 29.6 pg (ref 26.0–34.0)
MCH: 29.9 pg (ref 26.0–34.0)
MCHC: 32.6 g/dL (ref 30.0–36.0)
MCHC: 33.6 g/dL (ref 30.0–36.0)
MCV: 90.7 fL (ref 80.0–100.0)
MCV: 89 fL (ref 80.0–100.0)
Monocytes Absolute: 0.8 10*3/uL (ref 0.1–1.0)
Monocytes Absolute: 0.6 10*3/uL (ref 0.1–1.0)
Monocytes Relative: 4 %
Monocytes Relative: 3 %
Neutro Abs: 17.5 10*3/uL — ABNORMAL HIGH (ref 1.7–7.7)
Neutro Abs: 17.6 10*3/uL — ABNORMAL HIGH (ref 1.7–7.7)
Neutrophils Relative %: 89 %
Neutrophils Relative %: 93 %
Platelets: 303 10*3/uL (ref 150–400)
Platelets: 217 10*3/uL (ref 150–400)
RBC: 4.29 MIL/uL (ref 3.87–5.11)
RBC: 3.74 MIL/uL — ABNORMAL LOW (ref 3.87–5.11)
RDW: 13.2 % (ref 11.5–15.5)
RDW: 13.2 % (ref 11.5–15.5)
WBC: 19.9 10*3/uL — ABNORMAL HIGH (ref 4.0–10.5)
WBC: 18.9 10*3/uL — ABNORMAL HIGH (ref 4.0–10.5)
nRBC: 0 % (ref 0.0–0.2)
nRBC: 0 % (ref 0.0–0.2)

## 2019-02-27 LAB — PROTIME-INR
INR: 1.2 (ref 0.8–1.2)
Prothrombin Time: 14.6 seconds (ref 11.4–15.2)

## 2019-02-27 LAB — SARS CORONAVIRUS 2 (TAT 6-24 HRS): SARS Coronavirus 2: NEGATIVE

## 2019-02-27 MED ORDER — LORAZEPAM 2 MG/ML IJ SOLN
0.5000 mg | Freq: Once | INTRAMUSCULAR | Status: AC
  Administered 2019-02-27: 06:00:00 0.5 mg via INTRAVENOUS
  Filled 2019-02-27: qty 1

## 2019-02-27 MED ORDER — ACETAMINOPHEN 650 MG RE SUPP: 650.0000 mg | Freq: Four times a day (QID) | RECTAL | Status: DC | PRN

## 2019-02-27 MED ORDER — SODIUM CHLORIDE 0.9 % IV BOLUS
500.0000 mL | Freq: Once | INTRAVENOUS | Status: AC
  Administered 2019-02-27: 500 mL via INTRAVENOUS

## 2019-02-27 MED ORDER — PNEUMOCOCCAL VAC POLYVALENT 25 MCG/0.5ML IJ INJ
0.5000 mL | INJECTION | INTRAMUSCULAR | Status: DC
  Filled 2019-02-27 (×2): qty 0.5

## 2019-02-27 MED ORDER — CHOLECALCIFEROL 10 MCG (400 UNIT) PO TABS
400.0000 [IU] | ORAL_TABLET | Freq: Every day | ORAL | Status: DC
  Administered 2019-02-27 – 2019-03-02 (×4): 400 [IU] via ORAL
  Filled 2019-02-27 (×4): qty 1

## 2019-02-27 MED ORDER — ACETAMINOPHEN 325 MG PO TABS: 650.0000 mg | ORAL_TABLET | Freq: Four times a day (QID) | ORAL | Status: DC | PRN

## 2019-02-27 MED ORDER — SENNA 8.6 MG PO TABS
1.0000 | ORAL_TABLET | Freq: Two times a day (BID) | ORAL | Status: DC
  Administered 2019-02-27 – 2019-03-02 (×7): 8.6 mg via ORAL
  Filled 2019-02-27 (×8): qty 1

## 2019-02-27 MED ORDER — GABAPENTIN 100 MG PO CAPS
100.0000 mg | ORAL_CAPSULE | Freq: Every day | ORAL | Status: DC
  Administered 2019-02-27 – 2019-03-01 (×3): 100 mg via ORAL
  Filled 2019-02-27 (×4): qty 1

## 2019-02-27 MED ORDER — CLOPIDOGREL BISULFATE 75 MG PO TABS
75.0000 mg | ORAL_TABLET | Freq: Every day | ORAL | Status: DC
  Administered 2019-02-27: 75 mg via ORAL
  Filled 2019-02-27: qty 1

## 2019-02-27 MED ORDER — ACETAMINOPHEN 500 MG PO TABS
1000.0000 mg | ORAL_TABLET | Freq: Two times a day (BID) | ORAL | Status: DC
  Administered 2019-02-27 – 2019-03-02 (×7): 1000 mg via ORAL
  Filled 2019-02-27 (×8): qty 2

## 2019-02-27 MED ORDER — ONDANSETRON HCL 4 MG PO TABS: 4.0000 mg | ORAL_TABLET | Freq: Four times a day (QID) | ORAL | Status: DC | PRN

## 2019-02-27 MED ORDER — POLYETHYLENE GLYCOL 3350 17 G PO PACK: 17.0000 g | PACK | Freq: Every day | ORAL | Status: DC | PRN

## 2019-02-27 MED ORDER — DIAZEPAM 2 MG PO TABS: 2.0000 mg | ORAL_TABLET | Freq: Three times a day (TID) | ORAL | Status: DC | PRN

## 2019-02-27 MED ORDER — ONDANSETRON HCL 4 MG/2ML IJ SOLN: 4.0000 mg | Freq: Four times a day (QID) | INTRAMUSCULAR | Status: DC | PRN

## 2019-02-27 MED ORDER — PANTOPRAZOLE SODIUM 40 MG PO TBEC
40.0000 mg | DELAYED_RELEASE_TABLET | Freq: Every day | ORAL | Status: DC
  Administered 2019-02-27 – 2019-03-02 (×4): 40 mg via ORAL
  Filled 2019-02-27 (×5): qty 1

## 2019-02-27 MED ORDER — LIDOCAINE-EPINEPHRINE 2 %-1:100000 IJ SOLN
20.0000 mL | Freq: Once | INTRAMUSCULAR | Status: DC
  Filled 2019-02-27: qty 1

## 2019-02-27 MED ORDER — SODIUM CHLORIDE 0.9% IV SOLUTION
Freq: Once | INTRAVENOUS | Status: AC
  Administered 2019-02-28: 02:00:00 via INTRAVENOUS

## 2019-02-27 MED ORDER — ENOXAPARIN SODIUM 30 MG/0.3ML ~~LOC~~ SOLN
30.0000 mg | SUBCUTANEOUS | Status: DC
  Administered 2019-02-27: 30 mg via SUBCUTANEOUS
  Filled 2019-02-27 (×2): qty 0.3

## 2019-02-27 MED ORDER — METOPROLOL SUCCINATE ER 25 MG PO TB24
12.5000 mg | ORAL_TABLET | Freq: Every day | ORAL | Status: DC
  Administered 2019-02-27 – 2019-03-02 (×4): 12.5 mg via ORAL
  Filled 2019-02-27 (×5): qty 1
  Filled 2019-02-27: qty 0.5

## 2019-02-27 MED ORDER — TRAMADOL HCL 50 MG PO TABS
50.0000 mg | ORAL_TABLET | Freq: Four times a day (QID) | ORAL | Status: DC | PRN
  Administered 2019-03-01 – 2019-03-02 (×3): 50 mg via ORAL
  Filled 2019-02-27 (×3): qty 1

## 2019-02-27 NOTE — ED Triage Notes (Signed)
Patient brought in by ems from home. Patient slipped going to the bathroom. Patient states that she hit her head but denies LOC. Patient with complaint of chest pain with taking breaths. Patient also with complaint of left flank pain.

## 2019-02-27 NOTE — Progress Notes (Signed)
Anticoagulation monitoring(Lovenox):  85yo  female ordered Lovenox 40 mg Q24h  Filed Weights   02/27/19 0429  Weight: 95 lb (43.1 kg)   BMI 19.86   Lab Results  Component Value Date   CREATININE 0.52 02/27/2019   CREATININE 0.70 06/06/2018   CREATININE 0.60 10/27/2016   Estimated Creatinine Clearance: 29.6 mL/min (by C-G formula based on SCr of 0.52 mg/dL). Hemoglobin & Hematocrit     Component Value Date/Time   HGB 12.7 02/27/2019 0432   HCT 38.9 02/27/2019 0432     Per Protocol for Patient with estCrcl < 30 ml/min and BMI < 40, will transition to Lovenox 30 mg Q24h.

## 2019-02-27 NOTE — Consult Note (Signed)
Reason for Consult: Pelvic fracture Referring Physician: ER physician  Christina Mooney is an 85 y.o. female.  HPI: Patient suffered a fall while getting out of bed to go the bathroom.  She lives at [facility] by herself but does not drive anymore and uses a walker.  She suffered a fall and does not remember if she lost consciousness.  She is evaluated the emergency room and found to have multiple rib fracture as well as pneumothorax as well as left hip pain and x-ray showed pubic ramus fracture and fracture extended into the acetabulum.  I was consulted regarding this.  Past Medical History:  Diagnosis Date  . Chronic back pain   . Diverticulosis   . GERD (gastroesophageal reflux disease)   . Hyperlipidemia   . Hypertension   . Irritable bowel syndrome   . Lumbago   . Peripheral arterial disease (HCC)   . Spinal stenosis of lumbar region with neurogenic claudication     Past Surgical History:  Procedure Laterality Date  . APPENDECTOMY    . BREAST BIOPSY Right 1980   neg    Family History  Problem Relation Age of Onset  . Congestive Heart Failure Mother   . Cerebrovascular Accident Father     Social History:  reports that she has never smoked. She has never used smokeless tobacco. She reports that she does not drink alcohol or use drugs.  Allergies:  Allergies  Allergen Reactions  . Codeine Nausea And Vomiting  . Garlic Other (See Comments)    Reaction: unknown  . Milk-Related Compounds Other (See Comments)    Reaction: unknown  . Sorbitol Other (See Comments)    Reaction: unknown    Medications: I have reviewed the patient's current medications.  Results for orders placed or performed during the hospital encounter of 02/27/19 (from the past 48 hour(s))  Basic metabolic panel     Status: Abnormal   Collection Time: 02/27/19  4:32 AM  Result Value Ref Range   Sodium 140 135 - 145 mmol/L   Potassium 3.3 (L) 3.5 - 5.1 mmol/L   Chloride 107 98 - 111 mmol/L   CO2 23  22 - 32 mmol/L   Glucose, Bld 130 (H) 70 - 99 mg/dL   BUN 12 8 - 23 mg/dL   Creatinine, Ser 1.91 0.44 - 1.00 mg/dL   Calcium 9.3 8.9 - 47.8 mg/dL   GFR calc non Af Amer >60 >60 mL/min   GFR calc Af Amer >60 >60 mL/min   Anion gap 10 5 - 15    Comment: Performed at [facility], [address] [place], [place] [address]  CBC with Differential     Status: Abnormal   Collection Time: 02/27/19  4:32 AM  Result Value Ref Range   WBC 19.9 (H) 4.0 - 10.5 K/uL   RBC 4.29 3.87 - 5.11 MIL/uL   Hemoglobin 12.7 12.0 - 15.0 g/dL   HCT 13.0 86.5 - 78.4 %   MCV 90.7 80.0 - 100.0 fL   MCH 29.6 26.0 - 34.0 pg   MCHC 32.6 30.0 - 36.0 g/dL   RDW 69.6 29.5 - 28.4 %   Platelets 303 150 - 400 K/uL   nRBC 0.0 0.0 - 0.2 %   Neutrophils Relative % 89 %   Neutro Abs 17.5 (H) 1.7 - 7.7 K/uL   Lymphocytes Relative 6 %   Lymphs Abs 1.2 0.7 - 4.0 K/uL   Monocytes Relative 4 %   Monocytes Absolute 0.8  0.1 - 1.0 K/uL   Eosinophils Relative 0 %   Eosinophils Absolute 0.0 0.0 - 0.5 K/uL   Basophils Relative 0 %   Basophils Absolute 0.1 0.0 - 0.1 K/uL   Immature Granulocytes 1 %   Abs Immature Granulocytes 0.23 (H) 0.00 - 0.07 K/uL    Comment: Performed at [facility], [address] [facility] [place], [place] [id number]    Ct Head Wo Contrast  Result Date: 02/27/2019 CLINICAL DATA:  85 year old post slipped going to the bathroom striking head. Head trauma, minor, GCS>=13, high clinical risk, initial exam Head trauma, intracranial venous injury suspected; C-spine trauma, high clinical risk (NEXUS/CCR) EXAM: CT HEAD WITHOUT CONTRAST CT CERVICAL SPINE WITHOUT CONTRAST TECHNIQUE: Multidetector CT imaging of the head and cervical spine was performed following the standard protocol without intravenous contrast. Multiplanar CT image reconstructions of the cervical spine were also generated. COMPARISON:  CT 02/08/2017 FINDINGS: CT HEAD FINDINGS Brain: No intracranial hemorrhage, mass effect, or midline  shift. Unchanged atrophy and chronic small vessel ischemia. No hydrocephalus. The basilar cisterns are patent. No evidence of territorial infarct or acute ischemia. No extra-axial or intracranial fluid collection. Vascular: Atherosclerosis of skullbase vasculature without hyperdense vessel or abnormal calcification. Skull: No fracture or focal lesion. Sinuses/Orbits: Paranasal sinuses and mastoid air cells are clear. The visualized orbits are unremarkable. Other: None. CT CERVICAL SPINE FINDINGS Alignment: No traumatic subluxation. Exaggerated upper thoracic kyphosis and straightening of normal cervical lordosis unchanged from previous. Skull base and vertebrae: No acute fracture. Vertebral body heights are maintained. The dens and skull base are intact. Degenerative pannus at C1-C2. Soft tissues and spinal canal: No prevertebral fluid or swelling. No visible canal hematoma. Disc levels: Degenerative disc disease C4-C5 through C6-C7. Mild multilevel facet hypertrophy. Upper chest: Left apical pneumothorax, assessed on concurrent chest CT, reported separately. Left posterior second rib fracture. Other: None. IMPRESSION: 1. No acute intracranial abnormality. No skull fracture. Stable atrophy and chronic small vessel ischemia. 2. Degenerative change in the cervical spine without acute fracture or subluxation. 3. Left apical pneumothorax, assessed on concurrent chest CT, reported separately. Electronically Signed   By: Narda RutherfordMelanie  Sanford M.D.   On: 02/27/2019 05:19   Ct Chest Wo Contrast  Result Date: 02/27/2019 CLINICAL DATA:  Initial evaluation for acute trauma, rib fractures suspected. EXAM: CT CHEST WITHOUT CONTRAST TECHNIQUE: Multidetector CT imaging of the chest was performed following the standard protocol without IV contrast. COMPARISON:  None available. FINDINGS: Cardiovascular: Intrathoracic aorta normal in caliber without aneurysm or other acute finding. Moderate atherosclerosis. Visualized great vessels  grossly unremarkable. Heart size within normal limits. No pericardial effusion. Limited noncontrast evaluation of the pulmonary arterial tree grossly unremarkable. Mediastinum/Nodes: Thyroid within normal limits. No pathologically enlarged mediastinal, hilar, or axillary lymph nodes. Esophagus within normal limits. Small hiatal hernia noted. Lungs/Pleura: Tracheobronchial tree intact and patent. Moderate left-sided pneumothorax (estimated 30-40% volume). Associated volume loss within the left lung. Associated trace layering left pleural effusion. No pulmonary contusion. Mild atelectatic changes noted dependently within the right lower lobe. Right lung otherwise largely clear. No edema or pleural effusion. 5 mm right lower lobe nodule (series 3, image 62). Additional 4 mm pleural based right lower lobe nodule (series 3, image 83). Intra pulmonic lymph node noted along the right minor fissure. Pneumothorax. Upper Abdomen: Visualized upper abdomen demonstrates no acute finding. 1 cm simple left renal cyst noted. Musculoskeletal: There are acute fractures of the left anterolateral second, third, and fourth ribs, with mild displacement of the second and third rib  fractures. Additional subacute to chronic fractures of the left fourth and fifth ribs noted. Remotely healed fracture deformity of the sternum noted. Exaggeration of the normal thoracic kyphosis no worrisome osseous lesions. IMPRESSION: 1. Moderate left-sided pneumothorax (estimated 30-40% volume). Associated trace layering left pleural effusion. 2. Acute fractures of the left anterolateral second through fourth ribs. 3. No other acute traumatic injury within the chest. 4. Four-5 mm right lower lobe pulmonary nodules as above, indeterminate. No follow-up needed if patient is low-risk (and has no known or suspected primary neoplasm). Non-contrast chest CT can be considered in 12 months if patient is high-risk. This recommendation follows the consensus statement:  Guidelines for Management of Incidental Pulmonary Nodules Detected on CT Images: From the Fleischner Society 2017; Radiology 2017; 284:228-243. Critical Value/emergent results were called by telephone at the time of interpretation on 02/27/2019 at 5:24 am to providerPHILLIP STAFFORD , who verbally acknowledged these results. Electronically Signed   By: Jeannine Boga M.D.   On: 02/27/2019 05:31   Ct Cervical Spine Wo Contrast  Result Date: 02/27/2019 CLINICAL DATA:  85 year old post slipped going to the bathroom striking head. Head trauma, minor, GCS>=13, high clinical risk, initial exam Head trauma, intracranial venous injury suspected; C-spine trauma, high clinical risk (NEXUS/CCR) EXAM: CT HEAD WITHOUT CONTRAST CT CERVICAL SPINE WITHOUT CONTRAST TECHNIQUE: Multidetector CT imaging of the head and cervical spine was performed following the standard protocol without intravenous contrast. Multiplanar CT image reconstructions of the cervical spine were also generated. COMPARISON:  CT 02/08/2017 FINDINGS: CT HEAD FINDINGS Brain: No intracranial hemorrhage, mass effect, or midline shift. Unchanged atrophy and chronic small vessel ischemia. No hydrocephalus. The basilar cisterns are patent. No evidence of territorial infarct or acute ischemia. No extra-axial or intracranial fluid collection. Vascular: Atherosclerosis of skullbase vasculature without hyperdense vessel or abnormal calcification. Skull: No fracture or focal lesion. Sinuses/Orbits: Paranasal sinuses and mastoid air cells are clear. The visualized orbits are unremarkable. Other: None. CT CERVICAL SPINE FINDINGS Alignment: No traumatic subluxation. Exaggerated upper thoracic kyphosis and straightening of normal cervical lordosis unchanged from previous. Skull base and vertebrae: No acute fracture. Vertebral body heights are maintained. The dens and skull base are intact. Degenerative pannus at C1-C2. Soft tissues and spinal canal: No prevertebral  fluid or swelling. No visible canal hematoma. Disc levels: Degenerative disc disease C4-C5 through C6-C7. Mild multilevel facet hypertrophy. Upper chest: Left apical pneumothorax, assessed on concurrent chest CT, reported separately. Left posterior second rib fracture. Other: None. IMPRESSION: 1. No acute intracranial abnormality. No skull fracture. Stable atrophy and chronic small vessel ischemia. 2. Degenerative change in the cervical spine without acute fracture or subluxation. 3. Left apical pneumothorax, assessed on concurrent chest CT, reported separately. Electronically Signed   By: Keith Rake M.D.   On: 02/27/2019 05:19   Dg Chest Portable 1 View  Result Date: 02/27/2019 CLINICAL DATA:  Placement of left-sided chest tube. EXAM: PORTABLE CHEST 1 VIEW COMPARISON:  CT of the chest 02/27/2019 FINDINGS: The heart size is exaggerated by low lung volumes. A left-sided chest tube has been placed. The left apical pneumothorax has decreased in size, now 20 15%. Left-sided rib fractures are again noted. Mild atelectasis or fluid is present at the left base. No new fractures are evident. IMPRESSION: 1. Interval placement of left-sided chest tube with decrease in size of left apical pneumothorax. 2. Left-sided rib fractures. 3. Mild atelectasis or fluid at the left base. Electronically Signed   By: San Morelle M.D.   On: 02/27/2019 07:36  Dg Hip Unilat W Or Wo Pelvis 2-3 Views Left  Result Date: 02/27/2019 CLINICAL DATA:  Left hip pain after fall EXAM: DG HIP (WITH OR WITHOUT PELVIS) 2-3V LEFT COMPARISON:  None. FINDINGS: Acute fractures at the left superior puboacetabular junction and at the left inferior pubic ramus. No measurable displacement although there is some overlapping at the inferior ramus. The hip is intact and located. Generalized osteopenia. L4-5 asymmetric right degenerative disc narrowing. IMPRESSION: Nondisplaced left obturator ring fractures through the inferior ramus and  puboacetabular junction. Electronically Signed   By: Marnee Spring M.D.   On: 02/27/2019 05:12    ROS Blood pressure 136/62, pulse 93, temperature 97.9 F (36.6 C), temperature source Oral, resp. rate 20, height 4\' 10"  (1.473 m), weight 43.1 kg, SpO2 100 %. Physical Exam she has some pain with logrolling of the left leg and is tender around the left anterior hip to palpation.  She is able flex extend the toes and sensation is intact in the left lower extremity. Radiographic review shows nondisplaced fracture that goes to the acetabulum but is most likely a very stable fracture pattern that should allow her to be weightbearing as tolerated  Assessment/Plan: Left sided pubic ramus fracture extending into acetabulum recommendation is for weightbearing as tolerated and closer follow-up than usual with x-rays every 2 weeks for 6 weeks  02/27/2019, 12:48 PM

## 2019-02-27 NOTE — Progress Notes (Signed)
Notified [place], [place] with surgery about the blood found on the patients chux under her under her chest tube site. PA will come to assess.

## 2019-02-27 NOTE — Progress Notes (Signed)
Brief Progress Note Called by patient's RN regarding bloody drainage on patient's chest tube drainage, which is a change from prior. I came to the room to reassess the patient with her RN. On arrival, there was bloody drainage around the chest tube site and on the dressing. I removed the dressing and it appeared that the sutures anchoring the chest tube had loosened and the chest tube had slide out of position but NOT out of the pleural space. I advanced the chest tube back and rescured using 0-nylon sutures. She tolerated this well. There was no air leak on examination of pleural vac. Dressing was changed.   STAT Repeat CXR was obtained at this time which showed chest tube remained in good position.   Will transfer to [facility] pending bed availability We will follow up CXR in the AM Continue plan as outlined in consult note  -- Edison Simon, PA-C [facility] 02/27/2019, 3:45 PM [phone] M-F: 7am - 4pm

## 2019-02-27 NOTE — Progress Notes (Signed)
Was contacted by pt's RN who reported bleeding from chest tube site.  Apparently, this had been a problem earlier in the day and tube was re-sutured by our team's PA.  As I was not in the hospital at the time I was called, I recommended applying pressure and consideration of silver nitrate or a suture to the site, if a discrete site of active bleeding could be identified.  The ICU charge RN paged me after pressure had been applied to the site and reported that clot was forming and no active bleeding was appreciated at that time.  I recommended to reinforce the site and use wide foam tape as a pressure dressing to the area, as well as check CBC and coags.  At the time I was contacted, I was not aware that the patient is taking Plavix and has Lovenox ordered.  I put a hold on both medications for now.  As there is no pharmaceutical reversal for Plavix, if she continues to bleed, she will likely require a platelet transfusion.

## 2019-02-27 NOTE — ED Provider Notes (Signed)
[facility] Emergency Department Provider Note  ____________________________________________  Time seen: Approximately 5:14 AM  I have reviewed the triage vital signs and the nursing notes.   HISTORY  Chief Complaint Fall    HPI Christina Mooney is a 85 y.o. female with a history of GERD hypertension IBS who reports being in her usual state of health, when she slipped going to the bathroom falling onto her left side.  She complains of pain in the left anterior chest and left hip.  Also left posterior head where she fell against the wall.  Denies any back pain paresthesias or weakness.  No vision changes.  No loss of consciousness.  Pains are constant, worse with movement, no alleviating factors.     Past Medical History:  Diagnosis Date  . Chronic back pain   . Diverticulosis   . GERD (gastroesophageal reflux disease)   . Hyperlipidemia   . Hypertension   . Irritable bowel syndrome   . Lumbago   . Peripheral arterial disease ([place])   . Spinal stenosis of lumbar region with neurogenic claudication      Patient Active Problem List   Diagnosis Date Noted  . GERD (gastroesophageal reflux disease) 12/28/2015  . Carotid artery stenosis 12/28/2015  . Aortic atherosclerosis ([place]) 12/28/2015  . Hyperlipidemia 12/28/2015  . Essential hypertension, benign 12/28/2015  . Fibromuscular dysplasia ([place]) 12/28/2015     Past Surgical History:  Procedure Laterality Date  . APPENDECTOMY    . BREAST BIOPSY Right 1980   neg     Prior to Admission medications   Medication Sig Start Date End Date Taking? Authorizing Provider  acetaminophen (TYLENOL) 500 MG tablet Take 1,000 mg by mouth 2 (two) times daily.   Yes [provider]  cholecalciferol (VITAMIN D3) 10 MCG (400 UNIT) TABS tablet Take 400 Units by mouth daily.    Yes [provider]  clopidogrel (PLAVIX) 75 MG tablet TAKE 1 TABLET BY MOUTH DAILY Patient taking differently: Take 75 mg  by mouth daily.  12/28/18  Yes Schnier, Dolores Lory, MD  diazepam (VALIUM) 2 MG tablet Take 2 mg by mouth every 8 (eight) hours as needed (dizziness).  11/18/16  Yes [provider]  esomeprazole (NEXIUM) 40 MG capsule Take 40 mg by mouth daily.    Yes [provider]  gabapentin (NEURONTIN) 100 MG capsule Take 100 mg by mouth at bedtime.   Yes [provider]  metoprolol succinate (TOPROL-XL) 25 MG 24 hr tablet Take 12.5 mg by mouth daily. 02/22/17  Yes [provider]     Allergies Codeine, Garlic, Milk-related compounds, and Sorbitol   Family History  Problem Relation Age of Onset  . Congestive Heart Failure Mother   . Cerebrovascular Accident Father     Social History Social History   Tobacco Use  . Smoking status: Never Smoker  . Smokeless tobacco: Never Used  Substance Use Topics  . Alcohol use: No  . Drug use: No    Review of Systems  Constitutional:   No fever or chills.  ENT:   No sore throat. No rhinorrhea. Cardiovascular:   No chest pain or syncope. Respiratory:   No dyspnea or cough. Gastrointestinal:   Negative for abdominal pain, vomiting and diarrhea.  Musculoskeletal: Left chest wall pain and left hip pain as above. All other systems reviewed and are negative except as documented above in ROS and HPI.  ____________________________________________   PHYSICAL EXAM:  VITAL SIGNS: ED Triage Vitals  Enc Vitals  Group     BP 02/27/19 0428 (!) 149/91     Pulse Rate 02/27/19 0428 87     Resp 02/27/19 0428 18     Temp 02/27/19 0428 (!) 97.5 F (36.4 C)     Temp src --      SpO2 02/27/19 0428 92 %     Weight 02/27/19 0429 95 lb (43.1 kg)     Height 02/27/19 0429  (1.473 m)     Head Circumference --      Peak Flow --      Pain Score 02/27/19 0428 8     Pain Loc --      Pain Edu? --      Excl. in GC? --     Vital signs reviewed, nursing assessments reviewed.   Constitutional:   Alert and oriented. Non-toxic  appearance. Eyes:   Conjunctivae are normal. EOMI. PERRL. ENT      Head:   Normocephalic with left parietal scalp hematoma.  No laceration.  No battle sign, no raccoon eyes.      Nose:   Wearing a mask.  No epistaxis      Mouth/Throat:   Wearing a mask.      Neck:   No meningismus. Full ROM.  No midline spinal tenderness Hematological/Lymphatic/Immunilogical:   No cervical lymphadenopathy. Cardiovascular:   RRR. Symmetric bilateral radial and DP pulses.  No murmurs. Cap refill less than 2 seconds. Respiratory:   Normal respiratory effort without tachypnea/retractions. Breath sounds are clear and equal bilaterally. No wheezes/rales/rhonchi. Gastrointestinal:   Soft and nontender. Non distended. There is no CVA tenderness.  No rebound, rigidity, or guarding.  Musculoskeletal:   Mild tenderness of left hip at greater trochanter.  Pelvis is stable.  Diffuse tenderness of the left chest wall over the sternum and anterior ribs.  No midline spinal tenderness. Neurologic:   Normal speech and language.  Motor grossly intact. No acute focal neurologic deficits are appreciated.  Skin:    Skin is warm, dry and intact. No rash noted.  No petechiae, purpura, or bullae.  ____________________________________________    LABS (pertinent positives/negatives) (all labs ordered are listed, but only abnormal results are displayed) Labs Reviewed  BASIC METABOLIC PANEL - Abnormal; Notable for the following components:      Result Value   Potassium 3.3 (*)    Glucose, Bld 130 (*)    All other components within normal limits  CBC WITH DIFFERENTIAL/PLATELET - Abnormal; Notable for the following components:   WBC 19.9 (*)    Neutro Abs 17.5 (*)    Abs Immature Granulocytes 0.23 (*)    All other components within normal limits  SARS CORONAVIRUS 2 (TAT 6-24 HRS)  URINALYSIS, COMPLETE (UACMP) WITH MICROSCOPIC   ____________________________________________   EKG  Interpreted by me  Date: 02/27/2019   Rate: 89  Rhythm: normal sinus rhythm  QRS Axis: normal  Intervals: normal  ST/T Wave abnormalities: normal  Conduction Disutrbances: none  Narrative Interpretation: unremarkable      ____________________________________________    RADIOLOGY  Ct Head Wo Contrast  Result Date: 02/27/2019 CLINICAL DATA:  85 year old post slipped going to the bathroom striking head. Head trauma, minor, GCS>=13, high clinical risk, initial exam Head trauma, intracranial venous injury suspected; C-spine trauma, high clinical risk (NEXUS/CCR) EXAM: CT HEAD WITHOUT CONTRAST CT CERVICAL SPINE WITHOUT CONTRAST TECHNIQUE: Multidetector CT imaging of the head and cervical spine was performed following the standard protocol without intravenous contrast. Multiplanar CT image reconstructions of the cervical  spine were also generated. COMPARISON:  CT 02/08/2017 FINDINGS: CT HEAD FINDINGS Brain: No intracranial hemorrhage, mass effect, or midline shift. Unchanged atrophy and chronic small vessel ischemia. No hydrocephalus. The basilar cisterns are patent. No evidence of territorial infarct or acute ischemia. No extra-axial or intracranial fluid collection. Vascular: Atherosclerosis of skullbase vasculature without hyperdense vessel or abnormal calcification. Skull: No fracture or focal lesion. Sinuses/Orbits: Paranasal sinuses and mastoid air cells are clear. The visualized orbits are unremarkable. Other: None. CT CERVICAL SPINE FINDINGS Alignment: No traumatic subluxation. Exaggerated upper thoracic kyphosis and straightening of normal cervical lordosis unchanged from previous. Skull base and vertebrae: No acute fracture. Vertebral body heights are maintained. The dens and skull base are intact. Degenerative pannus at C1-C2. Soft tissues and spinal canal: No prevertebral fluid or swelling. No visible canal hematoma. Disc levels: Degenerative disc disease C4-C5 through C6-C7. Mild multilevel facet hypertrophy. Upper chest: Left  apical pneumothorax, assessed on concurrent chest CT, reported separately. Left posterior second rib fracture. Other: None. IMPRESSION: 1. No acute intracranial abnormality. No skull fracture. Stable atrophy and chronic small vessel ischemia. 2. Degenerative change in the cervical spine without acute fracture or subluxation. 3. Left apical pneumothorax, assessed on concurrent chest CT, reported separately. Electronically Signed   By: Narda RutherfordMelanie  Sanford M.D.   On: 02/27/2019 05:19   Ct Chest Wo Contrast  Result Date: 02/27/2019 CLINICAL DATA:  Initial evaluation for acute trauma, rib fractures suspected. EXAM: CT CHEST WITHOUT CONTRAST TECHNIQUE: Multidetector CT imaging of the chest was performed following the standard protocol without IV contrast. COMPARISON:  None available. FINDINGS: Cardiovascular: Intrathoracic aorta normal in caliber without aneurysm or other acute finding. Moderate atherosclerosis. Visualized great vessels grossly unremarkable. Heart size within normal limits. No pericardial effusion. Limited noncontrast evaluation of the pulmonary arterial tree grossly unremarkable. Mediastinum/Nodes: Thyroid within normal limits. No pathologically enlarged mediastinal, hilar, or axillary lymph nodes. Esophagus within normal limits. Small hiatal hernia noted. Lungs/Pleura: Tracheobronchial tree intact and patent. Moderate left-sided pneumothorax (estimated 30-40% volume). Associated volume loss within the left lung. Associated trace layering left pleural effusion. No pulmonary contusion. Mild atelectatic changes noted dependently within the right lower lobe. Right lung otherwise largely clear. No edema or pleural effusion. 5 mm right lower lobe nodule (series 3, image 62). Additional 4 mm pleural based right lower lobe nodule (series 3, image 83). Intra pulmonic lymph node noted along the right minor fissure. Pneumothorax. Upper Abdomen: Visualized upper abdomen demonstrates no acute finding. 1 cm simple  left renal cyst noted. Musculoskeletal: There are acute fractures of the left anterolateral second, third, and fourth ribs, with mild displacement of the second and third rib fractures. Additional subacute to chronic fractures of the left fourth and fifth ribs noted. Remotely healed fracture deformity of the sternum noted. Exaggeration of the normal thoracic kyphosis no worrisome osseous lesions. IMPRESSION: 1. Moderate left-sided pneumothorax (estimated 30-40% volume). Associated trace layering left pleural effusion. 2. Acute fractures of the left anterolateral second through fourth ribs. 3. No other acute traumatic injury within the chest. 4. Four-5 mm right lower lobe pulmonary nodules as above, indeterminate. No follow-up needed if patient is low-risk (and has no known or suspected primary neoplasm). Non-contrast chest CT can be considered in 12 months if patient is high-risk. This recommendation follows the consensus statement: Guidelines for Management of Incidental Pulmonary Nodules Detected on CT Images: From the Fleischner Society 2017; Radiology 2017; 284:228-243. Critical Value/emergent results were called by telephone at the time of interpretation on 02/27/2019 at 5:24 am  to providerPHILLIP Waverley Krempasky , who verbally acknowledged these results. Electronically Signed   By: Rise Mu M.D.   On: 02/27/2019 05:31   Ct Cervical Spine Wo Contrast  Result Date: 02/27/2019 CLINICAL DATA:  85 year old post slipped going to the bathroom striking head. Head trauma, minor, GCS>=13, high clinical risk, initial exam Head trauma, intracranial venous injury suspected; C-spine trauma, high clinical risk (NEXUS/CCR) EXAM: CT HEAD WITHOUT CONTRAST CT CERVICAL SPINE WITHOUT CONTRAST TECHNIQUE: Multidetector CT imaging of the head and cervical spine was performed following the standard protocol without intravenous contrast. Multiplanar CT image reconstructions of the cervical spine were also generated.  COMPARISON:  CT 02/08/2017 FINDINGS: CT HEAD FINDINGS Brain: No intracranial hemorrhage, mass effect, or midline shift. Unchanged atrophy and chronic small vessel ischemia. No hydrocephalus. The basilar cisterns are patent. No evidence of territorial infarct or acute ischemia. No extra-axial or intracranial fluid collection. Vascular: Atherosclerosis of skullbase vasculature without hyperdense vessel or abnormal calcification. Skull: No fracture or focal lesion. Sinuses/Orbits: Paranasal sinuses and mastoid air cells are clear. The visualized orbits are unremarkable. Other: None. CT CERVICAL SPINE FINDINGS Alignment: No traumatic subluxation. Exaggerated upper thoracic kyphosis and straightening of normal cervical lordosis unchanged from previous. Skull base and vertebrae: No acute fracture. Vertebral body heights are maintained. The dens and skull base are intact. Degenerative pannus at C1-C2. Soft tissues and spinal canal: No prevertebral fluid or swelling. No visible canal hematoma. Disc levels: Degenerative disc disease C4-C5 through C6-C7. Mild multilevel facet hypertrophy. Upper chest: Left apical pneumothorax, assessed on concurrent chest CT, reported separately. Left posterior second rib fracture. Other: None. IMPRESSION: 1. No acute intracranial abnormality. No skull fracture. Stable atrophy and chronic small vessel ischemia. 2. Degenerative change in the cervical spine without acute fracture or subluxation. 3. Left apical pneumothorax, assessed on concurrent chest CT, reported separately. Electronically Signed   By: Narda Rutherford M.D.   On: 02/27/2019 05:19   Dg Hip Unilat W Or Wo Pelvis 2-3 Views Left  Result Date: 02/27/2019 CLINICAL DATA:  Left hip pain after fall EXAM: DG HIP (WITH OR WITHOUT PELVIS) 2-3V LEFT COMPARISON:  None. FINDINGS: Acute fractures at the left superior puboacetabular junction and at the left inferior pubic ramus. No measurable displacement although there is some  overlapping at the inferior ramus. The hip is intact and located. Generalized osteopenia. L4-5 asymmetric right degenerative disc narrowing. IMPRESSION: Nondisplaced left obturator ring fractures through the inferior ramus and puboacetabular junction. Electronically Signed   By: Marnee Spring M.D.   On: 02/27/2019 05:12    ____________________________________________   PROCEDURES .Critical Care Performed by: Sharman Cheek, MD Authorized by: Sharman Cheek, MD   Critical care provider statement:    Critical care time (minutes):  33   Critical care time was exclusive of:  Separately billable procedures and treating other patients   Critical care was necessary to treat or prevent imminent or life-threatening deterioration of the following conditions:  Respiratory failure and trauma   Critical care was time spent personally by me on the following activities:  Development of treatment plan with patient or surrogate, discussions with consultants, evaluation of patient's response to treatment, examination of patient, obtaining history from patient or surrogate, ordering and performing treatments and interventions, ordering and review of laboratory studies, ordering and review of radiographic studies, pulse oximetry, re-evaluation of patient's condition and review of old charts Comments:        CHEST TUBE INSERTION  Date/Time: 02/27/2019 7:20 AM Performed by: Sharman Cheek, MD Authorized by:  Sharman Cheek, MD   Consent:    Consent obtained:  Emergent situation   Risks discussed:  Bleeding, incomplete drainage, nerve damage, infection and damage to surrounding structures   Alternatives discussed:  Delayed treatment, observation and referral Pre-procedure details:    Skin preparation:  Betadine and ChloraPrep   Preparation: Patient was prepped and draped in the usual sterile fashion   Sedation:    Sedation type:  Anxiolysis Anesthesia (see MAR for exact dosages):     Anesthesia method:  Local infiltration   Local anesthetic:  Lidocaine 2% WITH epi Procedure details:    Placement location:  L lateral   Scalpel size:  11   Tube size (Fr):  12   Dissection instrument:  Kelly clamp   Ultrasound guidance: no     Tension pneumothorax: no     Tube connected to:  Water seal   Drainage characteristics:  Bloody   Suture material:  2-0 silk   Dressing:  4x4 sterile gauze and petrolatum-impregnated gauze Post-procedure details:    Post-insertion x-ray findings: tube in good position     Patient tolerance of procedure:  Tolerated well, no immediate complications    ____________________________________________  DIFFERENTIAL DIAGNOSIS   Intracranial hemorrhage, C-spine fracture, rib fractures, hip fracture, dehydration, lecture light abnormality, UTI  CLINICAL IMPRESSION / ASSESSMENT AND PLAN / ED COURSE  Medications ordered in the ED: Medications  lidocaine-EPINEPHrine (XYLOCAINE W/EPI) 2 %-1:100000 (with pres) injection 20 mL (has no administration in time range)  sodium chloride 0.9 % bolus 500 mL (0 mLs Intravenous Stopped 02/27/19 0720)  LORazepam (ATIVAN) injection 0.5 mg (0.5 mg Intravenous Given 02/27/19 0608)    Pertinent labs & imaging results that were available during my care of the patient were reviewed by me and considered in my medical decision making (see chart for details).  Christina Mooney was evaluated in Emergency Department on 02/27/2019 for the symptoms described in the history of present illness. She was evaluated in the context of the global COVID-19 pandemic, which necessitated consideration that the patient might be at risk for infection with the SARS-CoV-2 virus that causes COVID-19. Institutional protocols and algorithms that pertain to the evaluation of patients at risk for COVID-19 are in a state of rapid change based on information released by regulatory bodies including the CDC and federal and state organizations. These  policies and algorithms were followed during the patient's care in the ED.   Patient reports being in her usual state of health until she had a slip and fall in her bathroom resulting in hitting the back of her head, left chest pain, left hip pain.  Will get imaging, check labs.  Clinical Course as of Feb 27 720  Mon Feb 27, 2019  0715 Pelvic fx d/w Dr. Rosita Kea, advises non op, WBAT.   [PS]    Clinical Course User Index [PS] Sharman Cheek, MD    ----------------------------------------- 7:33 AM on 02/27/2019 -----------------------------------------  Rib fractures were discussed with the radiologist at the time of read.  No evidence of a hemothorax on CT.  Chest tube inserted, follow-up chest x-ray pending.  Discussed with surgery Dr. Lady Gary who requests hospitalist admission due to age comorbidities and concomitant pelvic ring fracture.  Discussed with Dr. Allena Katz for evaluation.   ____________________________________________   FINAL CLINICAL IMPRESSION(S) / ED DIAGNOSES    Final diagnoses:  Multiple fractures of ribs, left side, initial encounter for closed fracture  Traumatic pneumothorax, initial encounter  Closed nondisplaced fracture of left pubis, initial encounter (  [facility])     ED Discharge Orders    None      Portions of this note were generated with dragon dictation software. Dictation errors may occur despite best attempts at proofreading.   Sharman Cheek, MD 02/27/19 [phone]

## 2019-02-27 NOTE — Progress Notes (Signed)
Report called to [facility] on [address]. Patient awaiting transfer

## 2019-02-27 NOTE — Consult Note (Addendum)
[facility] SURGICAL CONSULTATION NOTE (initial) - cpt: 28413 (Outpatient/ED)   HISTORY OF PRESENT ILLNESS (HPI):  85 y.o. female presented to [facility] ED today for evaluation of fall. Patient is resident at [facility]. She reports a mechanical slip and fall in her bathroom this morning. She landed on her left side. Following the fall she reports left chest and hip pain. No LOC. She denied any neurological symptoms. She does have some SOB secondary to chest pain. Work up in the ED was concerning for left pneumothorax, left 2-4 rib fracture, and left obturator ring fractures. ED physician placed chest tube.   Surgery is consulted by emergency medicine physician Dr. Carrie Mew, MD in this context for evaluation and management of left PTX s/p mechanical fall.   PAST MEDICAL HISTORY (PMH):  Past Medical History:  Diagnosis Date  . Chronic back pain   . Diverticulosis   . GERD (gastroesophageal reflux disease)   . Hyperlipidemia   . Hypertension   . Irritable bowel syndrome   . Lumbago   . Peripheral arterial disease ([place])   . Spinal stenosis of lumbar region with neurogenic claudication      PAST SURGICAL HISTORY [facility]):  Past Surgical History:  Procedure Laterality Date  . APPENDECTOMY    . BREAST BIOPSY Right 1980   neg     MEDICATIONS:  Prior to Admission medications   Medication Sig Start Date End Date Taking? Authorizing Provider  acetaminophen (TYLENOL) 500 MG tablet Take 1,000 mg by mouth 2 (two) times daily.   Yes [provider]  cholecalciferol (VITAMIN D3) 10 MCG (400 UNIT) TABS tablet Take 400 Units by mouth daily.    Yes [provider]  clopidogrel (PLAVIX) 75 MG tablet TAKE 1 TABLET BY MOUTH DAILY Patient taking differently: Take 75 mg by mouth daily.  12/28/18  Yes Schnier, Dolores Lory, MD  diazepam (VALIUM) 2 MG tablet Take 2 mg by mouth every 8 (eight) hours as needed (dizziness).  11/18/16  Yes [provider]   esomeprazole (NEXIUM) 40 MG capsule Take 40 mg by mouth daily.    Yes [provider]  gabapentin (NEURONTIN) 100 MG capsule Take 100 mg by mouth at bedtime.   Yes [provider]  metoprolol succinate (TOPROL-XL) 25 MG 24 hr tablet Take 12.5 mg by mouth daily. 02/22/17  Yes [provider]     ALLERGIES:  Allergies  Allergen Reactions  . Codeine Nausea And Vomiting  . Garlic Other (See Comments)    Reaction: unknown  . Milk-Related Compounds Other (See Comments)    Reaction: unknown  . Sorbitol Other (See Comments)    Reaction: unknown     SOCIAL HISTORY:  Social History   Socioeconomic History  . Marital status: Widowed    Spouse name: Not on file  . Number of children: Not on file  . Years of education: Not on file  . Highest education level: Not on file  Occupational History  . Not on file  Social Needs  . Financial resource strain: Not on file  . Food insecurity    Worry: Not on file    Inability: Not on file  . Transportation needs    Medical: Not on file    Non-medical: Not on file  Tobacco Use  . Smoking status: Never Smoker  . Smokeless tobacco: Never Used  Substance and Sexual Activity  . Alcohol use: No  . Drug use: No  . Sexual activity: Never  Lifestyle  .  Physical activity    Days per week: Not on file    Minutes per session: Not on file  . Stress: Not on file  Relationships  . Social Musicianconnections    Talks on phone: Not on file    Gets together: Not on file    Attends religious service: Not on file    Active member of club or organization: Not on file    Attends meetings of clubs or organizations: Not on file    Relationship status: Not on file  . Intimate partner violence    Fear of current or ex partner: Not on file    Emotionally abused: Not on file    Physically abused: Not on file    Forced sexual activity: Not on file  Other Topics Concern  . Not on file  Social History Narrative  . Not on file      FAMILY HISTORY:  Family History  Problem Relation Age of Onset  . Congestive Heart Failure Mother   . Cerebrovascular Accident Father       REVIEW OF SYSTEMS:  Review of Systems  Constitutional: Negative for chills and fever.  Respiratory: Negative for cough and shortness of breath.   Cardiovascular: Positive for chest pain. Negative for palpitations.  Gastrointestinal: Negative for abdominal pain, constipation, diarrhea, nausea and vomiting.  Musculoskeletal: Positive for falls and joint pain.  Neurological: Negative for dizziness, focal weakness and weakness.  All other systems reviewed and are negative.   VITAL SIGNS:  Temp:  [97.5 F (36.4 C)] 97.5 F (36.4 C) (10/19 0428) Pulse Rate:  [87-92] 92 (10/19 0730) Resp:  [18-26] 20 (10/19 0730) BP: (126-149)/(60-91) 132/65 (10/19 0730) SpO2:  [92 %-100 %] 100 % (10/19 0730) Weight:  [43.1 kg] 43.1 kg (10/19 0429)     Height: 4\' 10"  (147.3 cm) Weight: 43.1 kg BMI (Calculated): 19.86   INTAKE/OUTPUT:  No intake/output data recorded.  PHYSICAL EXAM:  Physical Exam Vitals signs and nursing note reviewed.  Constitutional:      Appearance: Normal appearance.     Comments: Frail appearing female  HENT:     Head: Normocephalic and atraumatic.  Eyes:     General: No scleral icterus.    Conjunctiva/sclera: Conjunctivae normal.  Cardiovascular:     Rate and Rhythm: Normal rate and regular rhythm.     Pulses: Normal pulses.     Heart sounds: Normal heart sounds. No murmur. No friction rub. No gallop.   Pulmonary:     Effort: Pulmonary effort is normal. No respiratory distress.     Breath sounds: Normal breath sounds. No wheezing.  Chest:    Abdominal:     General: Abdomen is flat.     Palpations: Abdomen is soft.     Tenderness: There is no abdominal tenderness. There is no guarding or rebound.     Comments: Previous lower midline incision  Genitourinary:    Comments: Deferred Musculoskeletal:     Right lower leg: No  edema.     Left lower leg: No edema.     Comments: Left hip fracture  Skin:    General: Skin is warm and dry.     Coloration: Skin is not pale.     Findings: No erythema.  Neurological:     General: No focal deficit present.     Mental Status: She is alert. She is disoriented.  Psychiatric:        Mood and Affect: Mood normal.  Behavior: Behavior normal.      Labs:  CBC Latest Ref Rng & Units 02/27/2019  WBC 4.0 - 10.5 K/uL 19.9(H)  Hemoglobin 12.0 - 15.0 g/dL 02.4  Hematocrit 09.7 - 46.0 % 38.9  Platelets 150 - 400 K/uL 303   CMP Latest Ref Rng & Units 02/27/2019 06/06/2018 10/27/2016  Glucose 70 - 99 mg/dL 353(G) - -  BUN 8 - 23 mg/dL 12 - -  Creatinine 9.92 - 1.00 mg/dL 4.26 8.34 1.96  Sodium 135 - 145 mmol/L 140 - -  Potassium 3.5 - 5.1 mmol/L 3.3(L) - -  Chloride 98 - 111 mmol/L 107 - -  CO2 22 - 32 mmol/L 23 - -  Calcium 8.9 - 10.3 mg/dL 9.3 - -    Imaging studies:   CT Chest (02/27/2019) personally reviewed showing left pneumothorax, and radiologist report reviewed:  IMPRESSION: 1. Moderate left-sided pneumothorax (estimated 30-40% volume). Associated trace layering left pleural effusion. 2. Acute fractures of the left anterolateral second through fourth ribs. 3. No other acute traumatic injury within the chest. 4. Four-5 mm right lower lobe pulmonary nodules as above, indeterminate. No follow-up needed if patient is low-risk (and has no known or suspected primary neoplasm). Non-contrast chest CT can be considered in 12 months if patient is high-risk.    Assessment/Plan: (ICD-10's: S27.0XXA, S22.42XA, [id number].Lorne Skeens) 85 y.o. female left rib fractures, left pneumothorax, and left obturator ring fractures following mechanical fall, complicated by pertinent comorbidities including advanced age.   - Recommend admission to medicine service given advanced age, comorbid conditions, and left hip fracture    - Pain control prn  - Appreciate chest tube placement by  ED physician; 2- cm suction  - Morning CXR  - Pulmonary toliet  - Further management per primary service  All of the above findings and recommendations were discussed with the patient, and all of patient's questions were answered to her expressed satisfaction.  Thank you for the opportunity to participate in this patient's care.   -- Lynden Oxford, PA-C  [facility] 02/27/2019, 8:13 AM [id number] M-F: 7am - 4pm  I have personally seen and examined this patient with Mr. Laqueta Due.  I agree with his note and assessment and plan as outlined above.  This is a 85 year old woman who suffered a fall at her extended care facility.  She complained of pain and was brought to the emergency room where a chest x-ray and CT scan confirmed the presence of a left-sided pneumothorax with multiple rib fractures.  She has brought into the hospital for management of those.  I have independently reviewed those x-rays and the lung is now expanded with the insertion of the chest tube.  She tells me that she is short of breath at baseline.  She is a lifelong non-smoker.  She has never had any previous heart or lung surgery although she did have 2 cesarean sections.  Her lungs are clear.  Her heart is regular without murmurs.  Her chest tube site is clean dry and intact.  There is no air leak.  We will leave the chest tube to suction today and repeat her chest x-ray tomorrow.  We can then place the chest tube to waterseal if there is no air leak in her lung is fully expanded.

## 2019-02-27 NOTE — H&P (Signed)
[facility] - [place] at [facility]   PATIENT NAME: Christina Mooney    MR#:  [id number]  DATE OF BIRTH:  [birth year]  DATE OF ADMISSION:  02/27/2019  PRIMARY CARE PHYSICIAN: Marguarite Arbour, MD   REQUESTING/REFERRING PHYSICIAN: Dr Scotty Court  CHIEF COMPLAINT:   Patient came in from [facility] independent facility after she slipped while going to the bathroom last night. Currently sedated from Ativan given earlier for her chest placement HISTORY OF PRESENT ILLNESS:  Christina Mooney  is a 85 y.o. female with a known history of hypertension, hyperlipidemia, peripheral arterial disease, past spinal stenosis comes to the emergency room from [facility] independent facility after she slept well going to the bathroom. Patient was brought to the emergency room found to have left 2nd to 4th rib fracture with left apical pneumothorax requiring chest tube placement in the emergency room. Dr. Lady Gary from surgery is aware. Patient also has acute Nondisplaced left obturator ring fractures through the inferior ramus and puboacetabular junction -- Dr. Rosita Kea aware. She is being admitted for further evaluation management  PAST MEDICAL HISTORY:   Past Medical History:  Diagnosis Date  . Chronic back pain   . Diverticulosis   . GERD (gastroesophageal reflux disease)   . Hyperlipidemia   . Hypertension   . Irritable bowel syndrome   . Lumbago   . Peripheral arterial disease (HCC)   . Spinal stenosis of lumbar region with neurogenic claudication     PAST SURGICAL HISTOIRY:   Past Surgical History:  Procedure Laterality Date  . APPENDECTOMY    . BREAST BIOPSY Right 1980   neg    SOCIAL HISTORY:   Social History   Tobacco Use  . Smoking status: Never Smoker  . Smokeless tobacco: Never Used  Substance Use Topics  . Alcohol use: No    FAMILY HISTORY:   Family History  Problem Relation Age of Onset  . Congestive Heart Failure Mother   . Cerebrovascular  Accident Father     DRUG ALLERGIES:   Allergies  Allergen Reactions  . Codeine Nausea And Vomiting  . Garlic Other (See Comments)    Reaction: unknown  . Milk-Related Compounds Other (See Comments)    Reaction: unknown  . Sorbitol Other (See Comments)    Reaction: unknown    REVIEW OF SYSTEMS:  Review of Systems  Unable to perform ROS: Medical condition     MEDICATIONS AT HOME:   Prior to Admission medications   Medication Sig Start Date End Date Taking? Authorizing Provider  acetaminophen (TYLENOL) 500 MG tablet Take 1,000 mg by mouth 2 (two) times daily.   Yes [provider]  cholecalciferol (VITAMIN D3) 10 MCG (400 UNIT) TABS tablet Take 400 Units by mouth daily.    Yes [provider]  clopidogrel (PLAVIX) 75 MG tablet TAKE 1 TABLET BY MOUTH DAILY Patient taking differently: Take 75 mg by mouth daily.  12/28/18  Yes Schnier, Latina Craver, MD  diazepam (VALIUM) 2 MG tablet Take 2 mg by mouth every 8 (eight) hours as needed (dizziness).  11/18/16  Yes [provider]  esomeprazole (NEXIUM) 40 MG capsule Take 40 mg by mouth daily.    Yes [provider]  gabapentin (NEURONTIN) 100 MG capsule Take 100 mg by mouth at bedtime.   Yes [provider]  metoprolol succinate (TOPROL-XL) 25 MG 24 hr tablet Take 12.5 mg by mouth daily. 02/22/17  Yes [provider]      VITAL SIGNS:  Blood pressure 136/62, pulse 93, temperature 97.9 F (36.6 C), temperature source Oral, resp. rate 20, height 4\' 10"  (1.473 m), weight 43.1 kg, SpO2 100 %.  PHYSICAL EXAMINATION:  GENERAL:  85 y.o.-year-old patient lying in the bed with no acute distress. Pt sedated currently  EYES: Pupils equal, round, reactive to light and accommodation. No scleral icterus. Extraocular muscles intact.  HEENT: Head atraumatic, normocephalic. Oropharynx and nasopharynx clear.  NECK:  Supple, no jugular venous distention. No thyroid enlargement, no tenderness.  LUNGS:  Normal breath sounds bilaterally, no wheezing, rales,rhonchi or crepitation. No use of accessory muscles of respiration. Left Lung Chest tube CARDIOVASCULAR: S1, S2 normal. No murmurs, rubs, or gallops.  ABDOMEN: Soft, nontender, nondistended. Bowel sounds present. No organomegaly or mass.  EXTREMITIES: No pedal edema, cyanosis, or clubbing.  NEUROLOGIC:unable to examine PSYCHIATRIC: The patient is sedated from IV meds SKIN: No obvious rash, lesion, or ulcer.   LABORATORY PANEL:   CBC Recent Labs  Lab 02/27/19 0432  WBC 19.9*  HGB 12.7  HCT 38.9  PLT 303   ------------------------------------------------------------------------------------------------------------------  Chemistries  Recent Labs  Lab 02/27/19 0432  NA 140  K 3.3*  CL 107  CO2 23  GLUCOSE 130*  BUN 12  CREATININE 0.52  CALCIUM 9.3   ------------------------------------------------------------------------------------------------------------------  Cardiac Enzymes No results for input(s): TROPONINI in the last 168 hours. ------------------------------------------------------------------------------------------------------------------  RADIOLOGY:  Ct Head Wo Contrast  Result Date: 02/27/2019 CLINICAL DATA:  85 year old post slipped going to the bathroom striking head. Head trauma, minor, GCS>=13, high clinical risk, initial exam Head trauma, intracranial venous injury suspected; C-spine trauma, high clinical risk (NEXUS/CCR) EXAM: CT HEAD WITHOUT CONTRAST CT CERVICAL SPINE WITHOUT CONTRAST TECHNIQUE: Multidetector CT imaging of the head and cervical spine was performed following the standard protocol without intravenous contrast. Multiplanar CT image reconstructions of the cervical spine were also generated. COMPARISON:  CT 02/08/2017 FINDINGS: CT HEAD FINDINGS Brain: No intracranial hemorrhage, mass effect, or midline shift. Unchanged atrophy and chronic small vessel ischemia. No hydrocephalus. The basilar  cisterns are patent. No evidence of territorial infarct or acute ischemia. No extra-axial or intracranial fluid collection. Vascular: Atherosclerosis of skullbase vasculature without hyperdense vessel or abnormal calcification. Skull: No fracture or focal lesion. Sinuses/Orbits: Paranasal sinuses and mastoid air cells are clear. The visualized orbits are unremarkable. Other: None. CT CERVICAL SPINE FINDINGS Alignment: No traumatic subluxation. Exaggerated upper thoracic kyphosis and straightening of normal cervical lordosis unchanged from previous. Skull base and vertebrae: No acute fracture. Vertebral body heights are maintained. The dens and skull base are intact. Degenerative pannus at C1-C2. Soft tissues and spinal canal: No prevertebral fluid or swelling. No visible canal hematoma. Disc levels: Degenerative disc disease C4-C5 through C6-C7. Mild multilevel facet hypertrophy. Upper chest: Left apical pneumothorax, assessed on concurrent chest CT, reported separately. Left posterior second rib fracture. Other: None. IMPRESSION: 1. No acute intracranial abnormality. No skull fracture. Stable atrophy and chronic small vessel ischemia. 2. Degenerative change in the cervical spine without acute fracture or subluxation. 3. Left apical pneumothorax, assessed on concurrent chest CT, reported separately. Electronically Signed   By: 04/10/2017 M.D.   On: 02/27/2019 05:19   Ct Chest Wo Contrast  Result Date: 02/27/2019 CLINICAL DATA:  Initial evaluation for acute trauma, rib fractures suspected. EXAM: CT CHEST WITHOUT CONTRAST TECHNIQUE: Multidetector CT imaging of the chest was performed following the standard protocol without IV contrast. COMPARISON:  None available. FINDINGS: Cardiovascular: Intrathoracic aorta normal in caliber without aneurysm or other acute finding. Moderate atherosclerosis.  Visualized great vessels grossly unremarkable. Heart size within normal limits. No pericardial effusion. Limited  noncontrast evaluation of the pulmonary arterial tree grossly unremarkable. Mediastinum/Nodes: Thyroid within normal limits. No pathologically enlarged mediastinal, hilar, or axillary lymph nodes. Esophagus within normal limits. Small hiatal hernia noted. Lungs/Pleura: Tracheobronchial tree intact and patent. Moderate left-sided pneumothorax (estimated 30-40% volume). Associated volume loss within the left lung. Associated trace layering left pleural effusion. No pulmonary contusion. Mild atelectatic changes noted dependently within the right lower lobe. Right lung otherwise largely clear. No edema or pleural effusion. 5 mm right lower lobe nodule (series 3, image 62). Additional 4 mm pleural based right lower lobe nodule (series 3, image 83). Intra pulmonic lymph node noted along the right minor fissure. Pneumothorax. Upper Abdomen: Visualized upper abdomen demonstrates no acute finding. 1 cm simple left renal cyst noted. Musculoskeletal: There are acute fractures of the left anterolateral second, third, and fourth ribs, with mild displacement of the second and third rib fractures. Additional subacute to chronic fractures of the left fourth and fifth ribs noted. Remotely healed fracture deformity of the sternum noted. Exaggeration of the normal thoracic kyphosis no worrisome osseous lesions. IMPRESSION: 1. Moderate left-sided pneumothorax (estimated 30-40% volume). Associated trace layering left pleural effusion. 2. Acute fractures of the left anterolateral second through fourth ribs. 3. No other acute traumatic injury within the chest. 4. Four-5 mm right lower lobe pulmonary nodules as above, indeterminate. No follow-up needed if patient is low-risk (and has no known or suspected primary neoplasm). Non-contrast chest CT can be considered in 12 months if patient is high-risk. This recommendation follows the consensus statement: Guidelines for Management of Incidental Pulmonary Nodules Detected on CT Images: From  the Fleischner Society 2017; Radiology 2017; 284:228-243. Critical Value/emergent results were called by telephone at the time of interpretation on 02/27/2019 at 5:24 am to providerPHILLIP STAFFORD , who verbally acknowledged these results. Electronically Signed   By: Rise MuBenjamin  McClintock M.D.   On: 02/27/2019 05:31   Ct Cervical Spine Wo Contrast  Result Date: 02/27/2019 CLINICAL DATA:  85 year old post slipped going to the bathroom striking head. Head trauma, minor, GCS>=13, high clinical risk, initial exam Head trauma, intracranial venous injury suspected; C-spine trauma, high clinical risk (NEXUS/CCR) EXAM: CT HEAD WITHOUT CONTRAST CT CERVICAL SPINE WITHOUT CONTRAST TECHNIQUE: Multidetector CT imaging of the head and cervical spine was performed following the standard protocol without intravenous contrast. Multiplanar CT image reconstructions of the cervical spine were also generated. COMPARISON:  CT 02/08/2017 FINDINGS: CT HEAD FINDINGS Brain: No intracranial hemorrhage, mass effect, or midline shift. Unchanged atrophy and chronic small vessel ischemia. No hydrocephalus. The basilar cisterns are patent. No evidence of territorial infarct or acute ischemia. No extra-axial or intracranial fluid collection. Vascular: Atherosclerosis of skullbase vasculature without hyperdense vessel or abnormal calcification. Skull: No fracture or focal lesion. Sinuses/Orbits: Paranasal sinuses and mastoid air cells are clear. The visualized orbits are unremarkable. Other: None. CT CERVICAL SPINE FINDINGS Alignment: No traumatic subluxation. Exaggerated upper thoracic kyphosis and straightening of normal cervical lordosis unchanged from previous. Skull base and vertebrae: No acute fracture. Vertebral body heights are maintained. The dens and skull base are intact. Degenerative pannus at C1-C2. Soft tissues and spinal canal: No prevertebral fluid or swelling. No visible canal hematoma. Disc levels: Degenerative disc disease  C4-C5 through C6-C7. Mild multilevel facet hypertrophy. Upper chest: Left apical pneumothorax, assessed on concurrent chest CT, reported separately. Left posterior second rib fracture. Other: None. IMPRESSION: 1. No acute intracranial abnormality. No skull fracture. Stable  atrophy and chronic small vessel ischemia. 2. Degenerative change in the cervical spine without acute fracture or subluxation. 3. Left apical pneumothorax, assessed on concurrent chest CT, reported separately. Electronically Signed   By: Keith Rake M.D.   On: 02/27/2019 05:19   Dg Chest Portable 1 View  Result Date: 02/27/2019 CLINICAL DATA:  Placement of left-sided chest tube. EXAM: PORTABLE CHEST 1 VIEW COMPARISON:  CT of the chest 02/27/2019 FINDINGS: The heart size is exaggerated by low lung volumes. A left-sided chest tube has been placed. The left apical pneumothorax has decreased in size, now 20 15%. Left-sided rib fractures are again noted. Mild atelectasis or fluid is present at the left base. No new fractures are evident. IMPRESSION: 1. Interval placement of left-sided chest tube with decrease in size of left apical pneumothorax. 2. Left-sided rib fractures. 3. Mild atelectasis or fluid at the left base. Electronically Signed   By: San Morelle M.D.   On: 02/27/2019 07:36   Dg Hip Unilat W Or Wo Pelvis 2-3 Views Left  Result Date: 02/27/2019 CLINICAL DATA:  Left hip pain after fall EXAM: DG HIP (WITH OR WITHOUT PELVIS) 2-3V LEFT COMPARISON:  None. FINDINGS: Acute fractures at the left superior puboacetabular junction and at the left inferior pubic ramus. No measurable displacement although there is some overlapping at the inferior ramus. The hip is intact and located. Generalized osteopenia. L4-5 asymmetric right degenerative disc narrowing. IMPRESSION: Nondisplaced left obturator ring fractures through the inferior ramus and puboacetabular junction. Electronically Signed   By: Monte Fantasia M.D.   On:  02/27/2019 05:12    EKG:    IMPRESSION AND PLAN:   Christina Mooney  is a 85 y.o. female with a known history of hypertension, hyperlipidemia, peripheral arterial disease, past spinal stenosis comes to the emergency room from [facility] independent facility after she slept well going to the bathroom. Patient was brought to the emergency room found to have left 2nd to 4th rib fracture with left apical pneumothorax requiring chest replacement in the emergency room.  1. Left apical pneumothorax with acute rib fracture 2nd to 4th rib post mechanical fall at [facility] independent living -patient underwent chest tube placement by ER physician -general surgery Dr. Celine Ahr to follow -IV and PO pain meds -daily chest x-ray -incentive spirometer  2. Acute nondisplaced left upper curator ring fracture with inferior ramus and puboacetabular junction pst fall -Dr Rudene Christians to see pt -PT orders per Dr Rudene Christians  3. Hypertension continue home meds  4. Peripheral arterial disease continue Plavix  5. DVT prophylaxis subcu Lovenox  Patient is full code according to the twin [facility] independent living paperwork  I spoke with patient's son Caytlyn Evers over the phone and updated. His phone number is [phone].    All the records are reviewed and case discussed with ED provider.   CODE STATUS: full  TOTAL TIME TAKING CARE OF THIS PATIENT: *50* minutes.    Fritzi Mandes M.D on 02/27/2019 at 10:07 AM  Between 7am to 6pm - Pager - [phone]  After 6pm go to [email or web address] - password [email or web address] [facility]  [facility]  Office  [phone]  CC: Primary care physician; Idelle Crouch, MD

## 2019-02-27 NOTE — Progress Notes (Signed)
Writer updated son on phone and went to tell patient writer updated him and noticed her gown was wet with blood as well as the pad underneath her. Writer placed pressure on chest tube site where bright red blood was coming from and called her assistance. Writer notified Dr. Celine Ahr with patient status. Dr. Michela Pitcher to reinforce it with pressure dressing or try to get ahold of doctor in the hospital who could assess dressing as doctor wasn't currently in the building. ICU charge nurse came to assess patient and noticed a clot had finally formed where multiple gauze and pressure had been applied to chest tube site. Rapid nurse placed blood work orders and per MD placed chest xray to check placement of chest tube. Dr. Jannifer Franklin was notified and advised to place bandage and reinforce dressing. Orders followed. Vital signs are checked every 5 minutes, patient is stable and vital signs within normal limits. Night nurse in with patient monitoring vitals.

## 2019-02-27 NOTE — Progress Notes (Signed)
PT Cancellation Note  Patient Details Name: Christina Mooney MRN: [id number] DOB: 1925/09/06   Cancelled Treatment:    Reason Eval/Treat Not Completed: Other (comment). Consult received and chart reviewed. Per recent note, pt with blood under chux pad from chest tube, PA coming to assess. Of note, pt also still connected to suction this date. Will hold at this time, until able to mobilize in order to perform full assessment.    Saagar Tortorella 02/27/2019, 3:10 PM  Greggory Stallion, PT, DPT [phone]

## 2019-02-28 ENCOUNTER — Inpatient Hospital Stay: Payer: Medicare PPO

## 2019-02-28 DIAGNOSIS — S270XXA Traumatic pneumothorax, initial encounter: Secondary | ICD-10-CM

## 2019-02-28 LAB — PHOSPHORUS: Phosphorus: 4.6 mg/dL (ref 2.5–4.6)

## 2019-02-28 LAB — CBC WITH DIFFERENTIAL/PLATELET
Abs Immature Granulocytes: 0.11 10*3/uL — ABNORMAL HIGH (ref 0.00–0.07)
Basophils Absolute: 0 10*3/uL (ref 0.0–0.1)
Basophils Relative: 0 %
Eosinophils Absolute: 0 10*3/uL (ref 0.0–0.5)
Eosinophils Relative: 0 %
HCT: 25.4 % — ABNORMAL LOW (ref 36.0–46.0)
Hemoglobin: 8.3 g/dL — ABNORMAL LOW (ref 12.0–15.0)
Immature Granulocytes: 1 %
Lymphocytes Relative: 4 %
Lymphs Abs: 0.6 10*3/uL — ABNORMAL LOW (ref 0.7–4.0)
MCH: 29.2 pg (ref 26.0–34.0)
MCHC: 32.7 g/dL (ref 30.0–36.0)
MCV: 89.4 fL (ref 80.0–100.0)
Monocytes Absolute: 0.7 10*3/uL (ref 0.1–1.0)
Monocytes Relative: 4 %
Neutro Abs: 15.4 10*3/uL — ABNORMAL HIGH (ref 1.7–7.7)
Neutrophils Relative %: 91 %
Platelets: 272 10*3/uL (ref 150–400)
RBC: 2.84 MIL/uL — ABNORMAL LOW (ref 3.87–5.11)
RDW: 13.1 % (ref 11.5–15.5)
WBC: 16.8 10*3/uL — ABNORMAL HIGH (ref 4.0–10.5)
nRBC: 0 % (ref 0.0–0.2)

## 2019-02-28 LAB — TYPE AND SCREEN
ABO/RH(D): A NEG
Antibody Screen: NEGATIVE

## 2019-02-28 LAB — BASIC METABOLIC PANEL
Anion gap: 6 (ref 5–15)
BUN: 16 mg/dL (ref 8–23)
CO2: 29 mmol/L (ref 22–32)
Calcium: 8.5 mg/dL — ABNORMAL LOW (ref 8.9–10.3)
Chloride: 105 mmol/L (ref 98–111)
Creatinine, Ser: 0.47 mg/dL (ref 0.44–1.00)
GFR calc Af Amer: >60 mL/min (ref >60)
GFR calc non Af Amer: >60 mL/min (ref >60)
Glucose, Bld: 113 mg/dL — ABNORMAL HIGH (ref 70–99)
Potassium: 3 mmol/L — ABNORMAL LOW (ref 3.5–5.1)
Sodium: 140 mmol/L (ref 135–145)

## 2019-02-28 LAB — HEMOGLOBIN: Hemoglobin: 8.5 g/dL — ABNORMAL LOW (ref 12.0–15.0)

## 2019-02-28 LAB — MAGNESIUM: Magnesium: 2.1 mg/dL (ref 1.7–2.4)

## 2019-02-28 MED ORDER — POTASSIUM CHLORIDE CRYS ER 20 MEQ PO TBCR
40.0000 meq | EXTENDED_RELEASE_TABLET | Freq: Once | ORAL | Status: AC
  Administered 2019-02-28: 15:00:00 40 meq via ORAL
  Filled 2019-02-28: qty 2

## 2019-02-28 MED ORDER — POTASSIUM CHLORIDE CRYS ER 20 MEQ PO TBCR
20.0000 meq | EXTENDED_RELEASE_TABLET | Freq: Once | ORAL | Status: AC
  Administered 2019-02-28: 18:00:00 20 meq via ORAL
  Filled 2019-02-28: qty 1

## 2019-02-28 NOTE — Progress Notes (Signed)
I was called again several hours later, at around 11:15 PM, by the patient's nurse reported the bleeding had started again.  I came and evaluated the patient.  There was a bulky dressing in place that was fairly saturated with dark semicoagulated blood.  This was removed.  The patient's chest tube was evaluated.  There was slow, trickle-type oozing coming from around the chest tube.  The chest tube itself was securely sutured in place, although a recent chest x-ray showed that it has been pulled out even further and is only barely within the pleural space..  There was no visible raw surface oozing nor any pulsatile bleeding identified.  Due to the patient's Plavix use, I suspect this is primarily secondary to platelet dysfunction.  It appears that lidocaine with epinephrine was utilized to place the chest tube.  The epinephrine would have caused vasoconstriction and prevented bleeding until it wore off.  This would roughly correspond to the time that our physicians' assistant was contacted regarding bleeding from the chest tube site.  Platelet transfusion is indicated due to Plavix-induced platelet failure.  Platelets have been ordered, but these will need to come from another hospital.  In the interim, I applied 2 packages of fibrillar and a package of Surgicel around the chest tube and covered this with Vaseline gauze and 2 ABDs.  These were secured with foam tape.

## 2019-02-28 NOTE — Progress Notes (Signed)
PT Cancellation Note  Patient Details Name: Christina Mooney MRN: [id number] DOB: [birth year]   Cancelled Treatment:    Reason Eval/Treat Not Completed: Patient's level of consciousness Spoke with nursing and MD, pt not appropriate for PT at this time - lethargic, wearing mitts, unable to participate.  Will try back later when/if pt becomes appropriate.  Kreg Shropshire, DPT 02/28/2019, 10:36 AM

## 2019-02-28 NOTE — Progress Notes (Signed)
  Patient ID: Christina Mooney, female   DOB: [birth year], 85 y.o.   MRN: [id number]  HISTORY: She is quite pleasant today but is confused.  It is difficult to know if she is oriented to person, place and time.  She does speak in complete sentences but does complain of some pain at the chest tube site  She had multiple x-rays made last evening for persistent bleeding around the chest tube site.  That has now stopped.   Vitals:   02/28/19 1153 02/28/19 1157  BP: 120/76 120/76  Pulse:  97  Resp:    Temp:    SpO2:  100%     EXAM:    Resp: Lungs are clear bilaterally.  No respiratory distress, normal effort. Heart:  Regular without murmurs Abd:  Abdomen is soft, non distended and non tender. No masses are palpable.  There is no rebound and no guarding.  Skin: Skin is warm and dry. No rash noted. No diaphoretic. No erythema. No pallor.  Psychiatric: Normal mood and affect. Normal behavior. Judgment and thought content normal.    ASSESSMENT: I have independently reviewed the patient's chest x-ray.  There is a trace apical pneumothorax but no pleural effusion.  There is some bruising along her left shoulder but there is no ecchymosis or bleeding around the chest tube site itself.  The tube appears to be occluded with clot.  There is no air leak from the chest tube.   PLAN:   We will place the chest tube to waterseal and repeat her chest x-ray this afternoon.  If her chest x-ray looks okay we will remove the chest tube.    Nestor Lewandowsky, MDPatient ID: Christina Mooney, female   DOB: 07-May-1926, 85 y.o.   MRN: [id number]

## 2019-03-01 DIAGNOSIS — J939 Pneumothorax, unspecified: Secondary | ICD-10-CM

## 2019-03-01 LAB — BPAM PLATELET PHERESIS
Blood Product Expiration Date: 202010202359
Blood Product Expiration Date: 202010222359
ISSUE DATE / TIME: 202010200240
ISSUE DATE / TIME: 202010200355
Unit Type and Rh: 6200
Unit Type and Rh: 7300

## 2019-03-01 LAB — BASIC METABOLIC PANEL
Anion gap: 6 (ref 5–15)
BUN: 16 mg/dL (ref 8–23)
CO2: 27 mmol/L (ref 22–32)
Calcium: 8.6 mg/dL — ABNORMAL LOW (ref 8.9–10.3)
Chloride: 107 mmol/L (ref 98–111)
Creatinine, Ser: 0.5 mg/dL (ref 0.44–1.00)
GFR calc Af Amer: >60 mL/min (ref >60)
GFR calc non Af Amer: >60 mL/min (ref >60)
Glucose, Bld: 100 mg/dL — ABNORMAL HIGH (ref 70–99)
Potassium: 3.8 mmol/L (ref 3.5–5.1)
Sodium: 140 mmol/L (ref 135–145)

## 2019-03-01 LAB — PREPARE PLATELET PHERESIS
Unit division: 0
Unit division: 0

## 2019-03-01 MED ORDER — MORPHINE SULFATE (PF) 2 MG/ML IV SOLN
1.0000 mg | Freq: Once | INTRAVENOUS | Status: AC
  Administered 2019-03-01: 1 mg via INTRAVENOUS
  Filled 2019-03-01: qty 1

## 2019-03-01 NOTE — Care Management (Signed)
RNCM confirmed that patient is not managed by [facility].  Seth Bake at [facility] notified.  Awaiting PT note

## 2019-03-01 NOTE — TOC Initial Note (Addendum)
Transition of Care [facility]) - Initial/Assessment Note    Patient Details  Name: Christina Mooney MRN: [id number] Date of Birth: 1925/08/08  Transition of Care [facility]) CM/SW Contact:    Beverly Sessions, RN Phone Number: 03/01/2019, 3:01 PM  Clinical Narrative:                 Patient admitted from [facility] independent  Reported that patient has fluctuating orientation status   RNCM spoke with son Nicki Reaper Via phone.  Nicki Reaper states that he resides in [place].  His brother Legrand Como lives in [place].  Legrand Como is currently in [place] staying at a local hotel.   Nicki Reaper states that patient is fairly independent.  States that over the last 6 months they have noted some intermittent confusion.     Patient sustained a fall  And found to have multiple rib fracture as well as pneumothorax as well as left hip pain and x-ray showed pubic ramus fracture and fracture extended into the acetabulum.  Patient did have a Chest tube, which has been removed.   PT eval pending.  [name] and MD in agreement for patient to discharge to the skilled side of [facility] when medically cleared.   RNCM spoke with Seth Bake at [facility].  States patient will need PT eval, and insurance auth prior to discharge.  PT and MD notified      Attempted to see is patient has existing PASRR  Received error message. - Your request can not be completed at this time. An email has been sent to [facility] technical support.  RNCM to follow up  Update:  PASRR obtained [id number]   Patient Goals and CMS Choice        Expected Discharge Plan and Services                                                Prior Living Arrangements/Services                       Activities of Daily Living Home Assistive Devices/Equipment: Gilford Rile (specify type) ADL Screening (condition at time of admission) Patient's cognitive ability adequate to safely complete daily activities?: Yes Is the patient deaf or have difficulty  hearing?: No Does the patient have difficulty seeing, even when wearing glasses/contacts?: No Does the patient have difficulty concentrating, remembering, or making decisions?: No Patient able to express need for assistance with ADLs?: Yes Does the patient have difficulty dressing or bathing?: Yes Independently performs ADLs?: No Communication: Independent Dressing (OT): Independent Grooming: Independent Feeding: Independent Bathing: Independent Toileting: Independent In/Out Bed: Independent with device (comment) Walks in Home: Independent with device (comment)(walker) Does the patient have difficulty walking or climbing stairs?: No Weakness of Legs: Left Weakness of Arms/Hands: Left  Permission Sought/Granted                  Emotional Assessment              Admission diagnosis:  Traumatic pneumothorax, initial encounter [S27.0XXA] Multiple fractures of ribs, left side, initial encounter for closed fracture [S22.42XA] Closed nondisplaced fracture of left pubis, initial encounter [facility]) [S32.502A] Patient Active Problem List   Diagnosis Date Noted  . Pneumothorax on left   . Pneumothorax, traumatic   . Fall 02/27/2019  . GERD (gastroesophageal reflux disease) 12/28/2015  . Carotid artery stenosis  12/28/2015  . Aortic atherosclerosis (HCC) 12/28/2015  . Hyperlipidemia 12/28/2015  . Essential hypertension, benign 12/28/2015  . Fibromuscular dysplasia (HCC) 12/28/2015   PCP:  Marguarite Arbour, MD Pharmacy:   [facility] [id number] [address] [place], [facility] - [address] [place] [address] Phone: [phone] Fax: [phone]     Social Determinants of Health (SDOH) Interventions    Readmission Risk Interventions No flowsheet data found.

## 2019-03-01 NOTE — Progress Notes (Signed)
[facility] [place] [facility]   PATIENT NAME: Emmali Karow    MR#:  [id number]  DATE OF BIRTH:  May 24, 1925  SUBJECTIVE:   Chest pain with some intermittent confusion REVIEW OF SYSTEMS:   Review of Systems  Constitutional: Negative for chills, fever and weight loss.  HENT: Negative for ear discharge, ear pain and nosebleeds.   Eyes: Negative for blurred vision, pain and discharge.  Respiratory: Negative for sputum production, wheezing and stridor.   Cardiovascular: Positive for chest pain. Negative for palpitations, orthopnea and PND.  Gastrointestinal: Negative for abdominal pain, diarrhea, nausea and vomiting.  Genitourinary: Negative for frequency and urgency.  Musculoskeletal: Negative for back pain and joint pain.  Neurological: Positive for weakness. Negative for sensory change, speech change and focal weakness.  Psychiatric/Behavioral: Negative for depression and hallucinations. The patient is not nervous/anxious.      DRUG ALLERGIES:   Allergies  Allergen Reactions  . Codeine Nausea And Vomiting  . Garlic Other (See Comments)    Reaction: unknown  . Milk-Related Compounds Other (See Comments)    Reaction: unknown  . Sorbitol Other (See Comments)    Reaction: unknown    VITALS:  Blood pressure (!) 105/52, pulse 86, temperature 97.7 F (36.5 C), temperature source Oral, resp. rate 16, height 4\' 10"  (1.473 m), weight 43.1 kg, SpO2 100 %.  PHYSICAL EXAMINATION:   Physical Exam  GENERAL:  85 y.o.-year-old patient lying in the bed with no acute distress.  EYES: Pupils equal, round, reactive to light and accommodation. No scleral icterus. Extraocular muscles intact.  HEENT: Head atraumatic, normocephalic. Oropharynx and nasopharynx clear.  NECK:  Supple, no jugular venous distention. No thyroid enlargement, no tenderness.  LUNGS: Normal breath sounds bilaterally, no wheezing, rales, rhonchi. No use of accessory muscles of respiration.  Left side CT + CARDIOVASCULAR: S1, S2 normal. No murmurs, rubs, or gallops.  ABDOMEN: Soft, nontender, nondistended. Bowel sounds present. No organomegaly or mass.  EXTREMITIES: No cyanosis, clubbing or edema b/l.    NEUROLOGIC: Cranial nerves II through XII are intact. No focal Motor or sensory deficits b/l.   PSYCHIATRIC:  patient is alert and oriented x 3.  SKIN: No obvious rash, lesion, or ulcer.   LABORATORY PANEL:  CBC Recent Labs  Lab 02/28/19 0513 02/28/19 1335  WBC 16.8*  --   HGB 8.3* 8.5*  HCT 25.4*  --   PLT 272  --     Chemistries  Recent Labs  Lab 02/28/19 0513 03/01/19 0531  NA 140 140  K 3.0* 3.8  CL 105 107  CO2 29 27  GLUCOSE 113* 100*  BUN 16 16  CREATININE 0.47 0.50  CALCIUM 8.5* 8.6*  MG 2.1  --    Cardiac Enzymes No results for input(s): TROPONINI in the last 168 hours. RADIOLOGY:  Dg Chest 2 View  Result Date: 02/28/2019 CLINICAL DATA:  Left-sided pneumothorax. EXAM: CHEST - 2 VIEW COMPARISON:  Chest x-ray 02/27/2019. FINDINGS: Mediastinum hilar structures normal. Left chest tube tip has been repositioned over the left mid chest. Tiny residual left apical pneumothorax again noted. Lungs are clear of acute infiltrates. Heart size stable. Multiple left rib fractures again noted. IMPRESSION: 1. Left chest tube has been repositioned over the left mid chest. Tiny residual left apical pneumothorax is again noted without interim change. 2.  Multiple left rib fractures again noted. Electronically Signed   By: 03/01/2019  Register   On: 02/28/2019 07:36   Dg Chest Port 1 View  Result  Date: 02/28/2019 CLINICAL DATA:  Postoperative check. Left-sided pneumothorax. EXAM: PORTABLE CHEST 1 VIEW COMPARISON:  One-view chest x-ray at 6:30 a.m. FINDINGS: Heart is mildly enlarged. Atherosclerotic calcifications are present at the aortic arch. There is no edema. Left-sided chest tube is in place. No significant residual pneumothorax is present. IMPRESSION: 1. No  significant residual pneumothorax. 2. Left-sided chest tube in place. 3. Mild cardiomegaly without failure. Electronically Signed   By: Marin Robertshristopher  Mattern M.D.   On: 02/28/2019 15:21   Dg Chest Port 1 View  Result Date: 02/27/2019 CLINICAL DATA:  Pneumothorax EXAM: PORTABLE CHEST 1 VIEW COMPARISON:  01/28/2019, 3:58 p.m., 7:27 a.m. FINDINGS: There is again interval retraction of a left-sided chest tube, which is now almost completely removed from the pleural space when compared to examination timestamped 7:27 a.m. Unchanged, tiny less than 5% left apical pneumothorax. Multiple left-sided rib fractures. Cardiomegaly. Aortic atherosclerosis. IMPRESSION: 1. There is again interval retraction of a left-sided chest tube, which is now almost completely removed from the pleural space when compared to examination timestamped 7:27 a.m. 2.  Unchanged, tiny less than 5% left apical pneumothorax. 3.  Multiple left-sided rib fractures. 4.  Cardiomegaly. Electronically Signed   By: Lauralyn PrimesAlex  Bibbey M.D.   On: 02/27/2019 20:46   Dg Chest Port 1 View  Result Date: 02/27/2019 CLINICAL DATA:  Left pneumothorax EXAM: PORTABLE CHEST 1 VIEW COMPARISON:  01/28/2019, 7:27 a.m. FINDINGS: Left-sided chest tube has been slightly retracted. Tiny, less than 5% persistent left apical pneumothorax. No acute appearing airspace opacity. Multiple minimally displaced left-sided rib fractures. Cardiomegaly. Aortic atherosclerosis. IMPRESSION: 1. Left-sided chest tube has been slightly retracted. 2.  Tiny, less than 5% persistent left apical pneumothorax. 3.  No acute appearing airspace opacity. 4.  Multiple minimally displaced left-sided rib fractures. 5.  Cardiomegaly. Electronically Signed   By: Lauralyn PrimesAlex  Bibbey M.D.   On: 02/27/2019 16:09   ASSESSMENT AND PLAN:  Reginold AgentFrances Darley  is a 85 y.o. female with a known history of hypertension, hyperlipidemia, peripheral arterial disease, past spinal stenosis [facility] after she slept well going to the bathroom. Patient was brought to the emergency room found to have left 2nd to 4th rib fracture with left apical pneumothorax requiring chest replacement in the emergency room.  1. Left apical pneumothorax with acute rib fracture 2nd to 4th rib post mechanical fall [facility] -patient underwent chest tube placement by ER physician - Dr Thelma Bargeaks to follow -IV and PO pain meds -daily chest x-ray -incentive spirometer  2. Acute nondisplaced left upper curator ring fracture with inferior ramus and puboacetabular junction pst fall -Dr Rosita Keamenz to see pt -PT orders per Dr Rosita Keamenz  3. Hypertension continue home meds  4. Peripheral arterial disease continue Plavix  5. DVT prophylaxis subcu Lovenox  Patient is full code [facility]  Spoke with son Lorin PicketScott Leinen on the phone  Case discussed with Care Management/Social Worker. Management plans discussed with the patient, family and they are in agreement.  CODE STATUS: full  DVT Prophylaxis: lovenox  TOTAL TIME TAKING CARE OF THIS PATIENT: *30* minutes.  >50% time spent on counselling and coordination of care  POSSIBLE D/C IN **1-2 DAYS, DEPENDING ON CLINICAL CONDITION.  Note: This dictation was prepared with Dragon dictation along with smaller phrase technology. Any transcriptional errors that result from this process are unintentional.  Enedina FinnerSona Lakeishia Truluck M.D on 03/01/2019 at 3:48 PM  Between 7am  to 6pm - Pager - [phone]  After 6pm go to [email or web address] - password [email or web address] [facility]  Office  [phone]  CC: Primary care physician; Idelle Crouch, MDPatient ID: Posey Rea, female   DOB: 1926-03-27, 85 y.o.   MRN: [id number]

## 2019-03-01 NOTE — Progress Notes (Signed)
Christina Mooney at [place] NAME: Christina Mooney    MR#:  [id number]  DATE OF BIRTH:  1925-09-23  SUBJECTIVE:  She is complaining of chest pain at the site where the tube was placed. Chest tube removed this morning REVIEW OF SYSTEMS:   Review of Systems  Constitutional: Negative for chills, fever and weight loss.  HENT: Negative for ear discharge, ear pain and nosebleeds.   Eyes: Negative for blurred vision, pain and discharge.  Respiratory: Negative for sputum production, shortness of breath, wheezing and stridor.   Cardiovascular: Positive for chest pain. Negative for palpitations, orthopnea and PND.  Gastrointestinal: Negative for abdominal pain, diarrhea, nausea and vomiting.  Genitourinary: Negative for frequency and urgency.  Musculoskeletal: Negative for back pain and joint pain.  Neurological: Negative for sensory change, speech change, focal weakness and weakness.  Psychiatric/Behavioral: Negative for depression and hallucinations. The patient is not nervous/anxious.    Tolerating Diet:yesTolerating PT:   DRUG ALLERGIES:   Allergies  Allergen Reactions  . Codeine Nausea And Vomiting  . Garlic Other (See Comments)    Reaction: unknown  . Milk-Related Compounds Other (See Comments)    Reaction: unknown  . Sorbitol Other (See Comments)    Reaction: unknown    VITALS:  Blood pressure (!) 105/52, pulse 86, temperature 97.7 F (36.5 C), temperature source Oral, resp. rate 16, height 4\' 10"  (1.473 m), weight 43.1 kg, SpO2 100 %.  PHYSICAL EXAMINATION:   Physical Exam  GENERAL:  85 y.o.-year-old patient lying in the bed with no acute distress.  EYES: Pupils equal, round, reactive to light and accommodation. No scleral icterus. Extraocular muscles intact.  HEENT: Head atraumatic, normocephalic. Oropharynx and nasopharynx clear.  NECK:  Supple, no jugular venous distention. No thyroid enlargement, no tenderness.  LUNGS:  Normal breath sounds bilaterally, no wheezing, rales, rhonchi. No use of accessory muscles of respiration. CT removed CARDIOVASCULAR: S1, S2 normal. No murmurs, rubs, or gallops.  ABDOMEN: Soft, nontender, nondistended. Bowel sounds present. No organomegaly or mass.  EXTREMITIES: No cyanosis, clubbing or edema b/l.    NEUROLOGIC: Cranial nerves II through XII are intact. No focal Motor or sensory deficits b/l.   PSYCHIATRIC:  patient is alert and oriented x 3.  SKIN: No obvious rash, lesion, or ulcer.   LABORATORY PANEL:  CBC Recent Labs  Lab 02/28/19 0513 02/28/19 1335  WBC 16.8*  --   HGB 8.3* 8.5*  HCT 25.4*  --   PLT 272  --     Chemistries  Recent Labs  Lab 02/28/19 0513 03/01/19 0531  NA 140 140  K 3.0* 3.8  CL 105 107  CO2 29 27  GLUCOSE 113* 100*  BUN 16 16  CREATININE 0.47 0.50  CALCIUM 8.5* 8.6*  MG 2.1  --    Cardiac Enzymes No results for input(s): TROPONINI in the last 168 hours. RADIOLOGY:  Dg Chest 2 View  Result Date: 02/28/2019 CLINICAL DATA:  Left-sided pneumothorax. EXAM: CHEST - 2 VIEW COMPARISON:  Chest x-ray 02/27/2019. FINDINGS: Mediastinum hilar structures normal. Left chest tube tip has been repositioned over the left mid chest. Tiny residual left apical pneumothorax again noted. Lungs are clear of acute infiltrates. Heart size stable. Multiple left rib fractures again noted. IMPRESSION: 1. Left chest tube has been repositioned over the left mid chest. Tiny residual left apical pneumothorax is again noted without interim change. 2.  Multiple left rib fractures again noted. Electronically Signed   By: Marcello Moores  Register  On: 02/28/2019 07:36   Dg Chest Port 1 View  Result Date: 02/28/2019 CLINICAL DATA:  Postoperative check. Left-sided pneumothorax. EXAM: PORTABLE CHEST 1 VIEW COMPARISON:  One-view chest x-ray at 6:30 a.m. FINDINGS: Heart is mildly enlarged. Atherosclerotic calcifications are present at the aortic arch. There is no edema.  Left-sided chest tube is in place. No significant residual pneumothorax is present. IMPRESSION: 1. No significant residual pneumothorax. 2. Left-sided chest tube in place. 3. Mild cardiomegaly without failure. Electronically Signed   By: Marin Roberts M.D.   On: 02/28/2019 15:21   Dg Chest Port 1 View  Result Date: 02/27/2019 CLINICAL DATA:  Pneumothorax EXAM: PORTABLE CHEST 1 VIEW COMPARISON:  01/28/2019, 3:58 p.m., 7:27 a.m. FINDINGS: There is again interval retraction of a left-sided chest tube, which is now almost completely removed from the pleural space when compared to examination timestamped 7:27 a.m. Unchanged, tiny less than 5% left apical pneumothorax. Multiple left-sided rib fractures. Cardiomegaly. Aortic atherosclerosis. IMPRESSION: 1. There is again interval retraction of a left-sided chest tube, which is now almost completely removed from the pleural space when compared to examination timestamped 7:27 a.m. 2.  Unchanged, tiny less than 5% left apical pneumothorax. 3.  Multiple left-sided rib fractures. 4.  Cardiomegaly. Electronically Signed   By: Lauralyn Primes M.D.   On: 02/27/2019 20:46   Dg Chest Port 1 View  Result Date: 02/27/2019 CLINICAL DATA:  Left pneumothorax EXAM: PORTABLE CHEST 1 VIEW COMPARISON:  01/28/2019, 7:27 a.m. FINDINGS: Left-sided chest tube has been slightly retracted. Tiny, less than 5% persistent left apical pneumothorax. No acute appearing airspace opacity. Multiple minimally displaced left-sided rib fractures. Cardiomegaly. Aortic atherosclerosis. IMPRESSION: 1. Left-sided chest tube has been slightly retracted. 2.  Tiny, less than 5% persistent left apical pneumothorax. 3.  No acute appearing airspace opacity. 4.  Multiple minimally displaced left-sided rib fractures. 5.  Cardiomegaly. Electronically Signed   By: Lauralyn Primes M.D.   On: 02/27/2019 16:09   ASSESSMENT AND PLAN:  Christina Mooney  is a 85 y.o. female with a known history of hypertension,  hyperlipidemia, peripheral arterial disease, past spinal stenosis comes [facility] the emergency room from [facility] independent facility after she slept well going to the bathroom. Patient was brought to the emergency room found to have left 2nd to 4th rib fracture with left apical pneumothorax requiring chest replacement in the emergency room.  1. Left apical pneumothorax with acute rib fracture 2nd to 4th rib post mechanical fall at [facility] independent living -patient underwent chest tube placement by ER physician -appreciate input from Dr. Thelma Barge cardiothoracic surgeon. -IV and PO pain meds -patient's chest X ray proven. Chest tube was removed today. -incentive spirometer  2. Acute nondisplaced left upper curator ring fracture with inferior ramus and puboacetabular junction pst fall -Dr Rosita Kea input appreciated -PT orders per Dr Rosita Kea-- weight-bearing as tolerated  3. Hypertension continue home meds  4. Peripheral arterial disease continue Plavix  5. DVT prophylaxis subcu Lovenox  Patient is full code according to the twin [facility] independent living paperwork  Discussed with patient's son in the room. Physical therapy to see patient. Case manager for discharge planning. Anticipate discharged tomorrow  Case discussed with Care Management/Social Worker. Management plans discussed with the patient, family and they are in agreement.  CODE STATUS: full  DVT Prophylaxis: Lovenox  TOTAL TIME TAKING CARE OF THIS PATIENT: 30 minutes.  >50% time spent on counselling and coordination of care  POSSIBLE D/C IN *1* DAYS, DEPENDING ON CLINICAL CONDITION.  Note: This dictation was prepared with Dragon dictation along with smaller phrase technology. Any transcriptional errors that result from this process are unintentional.  Enedina FinnerSona Bueford Arp M.D on 03/01/2019 at 3:45 PM  Between 7am to 6pm - Pager - [phone]  After 6pm go to [email or web address] - Social research officer, governmentpassword [email or web address] [facility]  [facility]   Office  [phone]  CC: Primary care physician; Marguarite ArbourSparks, Jeffrey D, MDPatient ID: Christina Mooney, female   DOB: Feb 03, 1926, 85 y.o.   MRN: [id number]

## 2019-03-01 NOTE — Progress Notes (Signed)
  Patient ID: Christina Mooney, female   DOB: 01/04/1926, 85 y.o.   MRN: [id number]  HISTORY: Today Christina Mooney is much more alert and oriented.  She continues to have some discomfort particularly on moving on her left side.   Vitals:   03/01/19 0441 03/01/19 0526  BP: (!) 110/40 (!) 97/55  Pulse: 68 69  Resp: 20   Temp: (!) 97.5 F (36.4 C)   SpO2: 100%      EXAM:    Resp: Lungs are clear bilaterally.  No respiratory distress, normal effort. Heart:  Regular without murmurs Abd:  Abdomen is soft, non distended and non tender. No masses are palpable.  There is no rebound and no guarding.  Neurological: Alert and oriented to person, place, and time. Coordination normal.  Skin: Skin is warm and dry. No rash noted. No diaphoretic. No erythema. No pallor.  Psychiatric: Normal mood and affect. Normal behavior.   ASSESSMENT: She did have a chest x-ray yesterday afternoon while on waterseal and that did not reveal any evidence of a pneumothorax.  She does not have an air leak today.  The tube itself appears occluded with old clot.   PLAN:   I did remove her chest tube today.  We placed a sterile dressing.  That can be removed in 48 hours and a dry Band-Aid placed over it.  She tolerated that procedure well without any difficulties.    Nestor Lewandowsky, MDPatient ID: Christina Mooney, female   DOB: 20-Mar-1926, 85 y.o.   MRN: [id number]

## 2019-03-01 NOTE — Evaluation (Addendum)
Physical Therapy Evaluation Patient Details Name: Christina BarriosFrances S Gero MRN: [id number] DOB: 03-17-1926 Today's Date: 03/01/2019   History of Present Illness  Per MD: Pt is a 85 y.o. female with a known history of hypertension, hyperlipidemia, peripheral arterial disease, past spinal stenosis comes to the emergency room from [facility] independent facility after she slipped going to the bathroom. MD assessment includes: Left apical pneumothorax with acute rib fracture 2nd to 4th rib, Acute nondisplaced left upper curator ring fracture with inferior ramus and puboacetabular junction, hypertension - controlled, Peripheral arterial disease - controlled  Clinical Impression  Pt presented with deficits in strength, transfers, mobility, gait, balance, and activity tolerance. Pt's pain was not well-controlled, and she reported it at 5/10 initially, and with bed mobility, transfers, attempted ambulation was so painful and weak that she required +2 max A throughout. While supported in static standing, pt was given both a HW and RW to test which helped her more, and she stated that she felt like she was "going to flip over" using the HM, though both caused RUE pain due to pt limiting weightbearing through LUE. Pt will benefit from PT services in a SNF setting upon discharge to safely address above deficits for decreased caregiver assistance and eventual return to PLOF.    Follow Up Recommendations SNF    Equipment Recommendations  (TBD at next venue of care.)    Recommendations for Other Services       Precautions / Restrictions Precautions Precautions: Fall Precaution Comments: High fall risk Restrictions Weight Bearing Restrictions: Yes RUE Weight Bearing: Weight bearing as tolerated LUE Weight Bearing: Weight bearing as tolerated RLE Weight Bearing: Weight bearing as tolerated LLE Weight Bearing: Weight bearing as tolerated Other Position/Activity Restrictions: Per conversation with Dr Rosita KeaMenz to  clarify WB order, pt okay to be WBAT to all four extremities.      Mobility  Bed Mobility Overal bed mobility: Needs Assistance Bed Mobility: Rolling;Sidelying to Sit;Supine to Sit Rolling: +2 for physical assistance;Max assist Sidelying to sit: +2 for physical assistance;Max assist Supine to sit: +2 for physical assistance;Max assist     General bed mobility comments: Movement was so weak and painful that even basic mobility required max A.  Transfers Overall transfer level: Needs assistance Equipment used: Rolling walker (2 wheeled) Transfers: Sit to/from Stand Sit to Stand: Max assist;+2 physical assistance         General transfer comment: Pt was able to plant feet, and exhibited posterior lean and required +2 max A to come to and remain standing.  Ambulation/Gait   Gait Distance (Feet): 0 Feet     Gait velocity: NA   General Gait Details: Unable to advance either LE.  Stairs            Wheelchair Mobility    Modified Rankin (Stroke Patients Only)       Balance Overall balance assessment: Needs assistance Sitting-balance support: Bilateral upper extremity supported;Feet supported Sitting balance-Leahy Scale: Poor   Postural control: Posterior lean Standing balance support: Bilateral upper extremity supported Standing balance-Leahy Scale: Zero                               Pertinent Vitals/Pain Pain Assessment: 0-10 Pain Score: 5  Pain Location: L hip and L ribs Pain Descriptors / Indicators: Aching;Constant Pain Intervention(s): Limited activity within patient's tolerance;Monitored during session    Home Living Family/patient expects to be discharged to:: Private residence(Residence is within the  [employer]) Living Arrangements: Alone Available Help at Discharge: Personal care attendant(2-3 times a week, to help out with household tasks, cleaning, etc.) Type of Home: Independent living facility Home Access: Stairs to  enter Entrance Stairs-Rails: Right Entrance Stairs-Number of Steps: 2 Home Layout: One level Home Equipment: Walker - 2 wheels;Walker - 4 wheels      Prior Function Level of Independence: Independent with assistive device(s)         Comments: Pt was Ind with ADL and used rollator for [facility] distances, recent gave up driving and sold her car, receives 2-3x weekly in-home help, capable of walking community distances with RW.     Hand Dominance        Extremity/Trunk Assessment   Upper Extremity Assessment Upper Extremity Assessment: Generalized weakness    Lower Extremity Assessment Lower Extremity Assessment: Generalized weakness;LLE deficits/detail LLE: Unable to fully assess due to pain LLE Sensation: WNL LLE Coordination: WNL    Cervical / Trunk Assessment Cervical / Trunk Assessment: Kyphotic  Communication   Communication: No difficulties  Cognition Arousal/Alertness: Awake/alert Behavior During Therapy: WFL for tasks assessed/performed Overall Cognitive Status: Within Functional Limits for tasks assessed                                        General Comments      Exercises Total Joint Exercises Ankle Circles/Pumps: AROM;Strengthening;Both;10 reps Quad Sets: AROM;Strengthening;Both;10 reps Hip ABduction/ADduction: AAROM;5 reps;Both Long Arc Quad: AROM;Strengthening;Both;10 reps Other Exercises Other Exercises: Pt stood 3-4 minutes while supported +2. Other Exercises: Multiple sit<>stand transfers from bed and chair. Other Exercises: Unsupported static sitting at EOB and edge of recliner.   Assessment/Plan    PT Assessment Patient needs continued PT services  PT Problem List Decreased strength;Decreased range of motion;Decreased activity tolerance;Decreased balance;Decreased mobility;Decreased knowledge of use of DME       PT Treatment Interventions DME instruction;Gait training;Stair training;Functional mobility training;Therapeutic  activities;Therapeutic exercise;Balance training;Patient/family education    PT Goals (Current goals can be found in the Care Plan section)  Acute Rehab PT Goals Patient Stated Goal: Strengthen legs. PT Goal Formulation: With patient Time For Goal Achievement: 03/14/19 Potential to Achieve Goals: Fair    Frequency 7X/week   Barriers to discharge        Co-evaluation               AM-PAC PT "6 Clicks" Mobility  Outcome Measure Help needed turning from your back to your side while in a flat bed without using bedrails?: Total Help needed moving from lying on your back to sitting on the side of a flat bed without using bedrails?: Total Help needed moving to and from a bed to a chair (including a wheelchair)?: Total Help needed standing up from a chair using your arms (e.g., wheelchair or bedside chair)?: Total Help needed to walk in hospital room?: Total Help needed climbing 3-5 steps with a railing? : Total 6 Click Score: 6    End of Session Equipment Utilized During Treatment: Gait belt Activity Tolerance: Patient limited by fatigue;Patient limited by pain Patient left: in chair;with family/visitor present;with nursing/sitter in room;with call bell/phone within reach;with chair alarm set Nurse Communication: Mobility status PT Visit Diagnosis: Unsteadiness on feet (R26.81);Muscle weakness (generalized) (M62.81);Pain Pain - Right/Left: Left Pain - part of body: Hip(Ribs)    Time: 4917-9150 PT Time Calculation (min) (ACUTE ONLY): 58 min   Charges:  Nancy Arvin "Gus" Rotenberg, SPT  03/01/19, 5:30 PM

## 2019-03-02 MED ORDER — TRAMADOL HCL 50 MG PO TABS: 50.0000 mg | ORAL_TABLET | Freq: Four times a day (QID) | ORAL | 0 refills | Status: DC | PRN

## 2019-03-02 MED ORDER — DIAZEPAM 2 MG PO TABS: 2.0000 mg | ORAL_TABLET | Freq: Three times a day (TID) | ORAL | 0 refills | Status: DC | PRN

## 2019-03-02 NOTE — TOC Transition Note (Signed)
Transition of Care [facility]) - CM/SW Discharge Note   Patient Details  Name: Christina Mooney MRN: [id number] Date of Birth: 1925-11-17  [facility]) CM/SW Contact:  Beverly Sessions, RN Phone Number: 03/02/2019, 1:41 PM   Clinical Narrative:    Patient to discharge to [facility] SNF today Seth Bake at [facility] has obtained [place]. RNCM confirmed that covid test would not have to be repeated.    EMS packet on the chart. Bedside RN notified    Final next level of care: Skilled Nursing Facility Barriers to Discharge: Barriers Resolved   Patient Goals and CMS Choice   CMS Medicare.gov Compare Post Acute Care list provided to:: Patient Choice offered to / list presented to : Adult Children  Discharge Placement              [facility] Patient to be transferred to facility by: EMS Name of family member notified: Legrand Como Patient and family notified of of transfer: 03/02/19  Discharge Plan and Services                                     Social Determinants of Health (SDOH) Interventions     Readmission Risk Interventions No flowsheet data found.

## 2019-03-02 NOTE — Discharge Instructions (Signed)
Activity as tolerated

## 2019-03-02 NOTE — Care Management Important Message (Signed)
Important Message  Patient Details  Name: Christina Mooney MRN: [id number] Date of Birth: 11-07-1925   Medicare Important Message Given:  Yes     Dannette Barbara 03/02/2019, 2:21 PM

## 2019-03-02 NOTE — Progress Notes (Signed)
Patient discharged to [facility] room 319. Son present in room when EMS arrived. Report given to Janett Billow RN at facility.Prescriptions in packet. Belongings gathered and sent to the facility with the patient.

## 2019-03-02 NOTE — NC FL2 (Signed)
[place] LEVEL OF CARE SCREENING TOOL     IDENTIFICATION  Patient Name: Christina Mooney Birthdate: August 21, 1925 Sex: female Admission Date (Current Location): 02/27/2019  [facility] and [place] Number:  Engineering geologist and Address:         Provider Number: [id number]  Attending Physician Name and Address:  Hillary Bow, MD  Relative Name and Phone Number:       Current Level of Care: Hospital Recommended Level of Care: [place] Prior Approval Number:    Date Approved/Denied:   PASRR Number: [id number]  Discharge Plan: SNF    Current Diagnoses: Patient Active Problem List   Diagnosis Date Noted  . Pneumothorax on left   . Pneumothorax, traumatic   . Fall 02/27/2019  . GERD (gastroesophageal reflux disease) 12/28/2015  . Carotid artery stenosis 12/28/2015  . Aortic atherosclerosis ([place]) 12/28/2015  . Hyperlipidemia 12/28/2015  . Essential hypertension, benign 12/28/2015  . Fibromuscular dysplasia ([place]) 12/28/2015    Orientation RESPIRATION BLADDER Height & Weight     Self, Place  Normal Incontinent, External catheter Weight: 43.1 kg Height:  4\' 10"  (147.3 cm)  BEHAVIORAL SYMPTOMS/MOOD NEUROLOGICAL BOWEL NUTRITION STATUS      Continent Diet(Regular)  AMBULATORY STATUS COMMUNICATION OF NEEDS Skin   Extensive Assist Verbally Bruising                       Personal Care Assistance Level of Assistance              Functional Limitations Info             SPECIAL CARE FACTORS FREQUENCY  PT (By licensed PT), OT (By licensed OT)                    Contractures Contractures Info: Not present    Additional Factors Info  Code Status, Allergies Code Status Info: Full Allergies Info: Codeine, garlic, milk related compounds, sorbital           Current Medications (03/02/2019):  This is the current hospital active medication list Current Facility-Administered Medications  Medication Dose Route  Frequency Provider Last Rate Last Dose  . acetaminophen (TYLENOL) tablet 650 mg  650 mg Oral Q6H PRN Fritzi Mandes, MD       Or  . acetaminophen (TYLENOL) suppository 650 mg  650 mg Rectal Q6H PRN Fritzi Mandes, MD      . acetaminophen (TYLENOL) tablet 1,000 mg  1,000 mg Oral BID Fritzi Mandes, MD   1,000 mg at 03/02/19 0941  . cholecalciferol (VITAMIN D3) tablet 400 Units  400 Units Oral Daily Fritzi Mandes, MD   400 Units at 03/02/19 0941  . diazepam (VALIUM) tablet 2 mg  2 mg Oral Q8H PRN Fritzi Mandes, MD      . gabapentin (NEURONTIN) capsule 100 mg  100 mg Oral QHS Fritzi Mandes, MD   100 mg at 03/01/19 2026  . lidocaine-EPINEPHrine (XYLOCAINE W/EPI) 2 %-1:100000 (with pres) injection 20 mL  20 mL Infiltration Once Carrie Mew, MD      . metoprolol succinate (TOPROL-XL) 24 hr tablet 12.5 mg  12.5 mg Oral Daily Fritzi Mandes, MD   12.5 mg at 03/02/19 9390  . ondansetron (ZOFRAN) tablet 4 mg  4 mg Oral Q6H PRN Fritzi Mandes, MD       Or  . ondansetron [facility]) injection 4 mg  4 mg Intravenous Q6H PRN Fritzi Mandes, MD      . pantoprazole ([place])  EC tablet 40 mg  40 mg Oral Daily Enedina Finner, MD   40 mg at 03/02/19 0943  . pneumococcal 23 valent vaccine (PNU-IMMUNE) injection 0.5 mL  0.5 mL Intramuscular Tomorrow-1000 Enedina Finner, MD      . polyethylene glycol (MIRALAX / GLYCOLAX) packet 17 g  17 g Oral Daily PRN Enedina Finner, MD      . senna (SENOKOT) tablet 8.6 mg  1 tablet Oral BID Enedina Finner, MD   8.6 mg at 03/02/19 0941  . traMADol (ULTRAM) tablet 50 mg  50 mg Oral Q6H PRN Enedina Finner, MD   50 mg at 03/02/19 7062     Discharge Medications: Please see discharge summary for a list of discharge medications.  Relevant Imaging Results:  Relevant Lab Results:   Additional Information    [place], Julio Alm, RN

## 2019-03-02 NOTE — TOC Progression Note (Addendum)
Transition of Care [facility]) - Progression Note    Patient Details  Name: Christina Mooney MRN: [id number] Date of Birth: 1926/03/04  Transition of [facility]) CM/SW Contact  Beverly Sessions, RN Phone Number: 03/02/2019, 9:48 AM  Clinical Narrative:     Clinical sent to Seth Bake in [place]  Update: Fl2 sent for signature Seth Bake at [facility] has started insurance auth        Expected Discharge Plan and Services                                                 Social Determinants of Health (SDOH) Interventions    Readmission Risk Interventions No flowsheet data found.

## 2019-03-02 NOTE — Progress Notes (Signed)
Physical Therapy Treatment Patient Details Name: Christina Mooney MRN: [id number] DOB: 04/22/1926 Today's Date: 03/02/2019    History of Present Illness Per MD: Pt is a 85 y.o. female with a known history of hypertension, hyperlipidemia, peripheral arterial disease, past spinal stenosis comes to the emergency room from [facility] independent facility after she slipped going to the bathroom. MD assessment includes: Left apical pneumothorax with acute rib fracture 2nd to 4th rib, Acute nondisplaced left upper curator ring fracture with inferior ramus and puboacetabular junction, hypertension - controlled, Peripheral arterial disease - controlled    PT Comments    Pt presented with deficits in strength, transfers, mobility, gait, balance, and activity tolerance. Pt's pain was mild, she reported a 0/10 at rest, and increased and quickly subsided with functional activity. Pt completed several bed exercises with mild resistance to help slow decline of strength. Pt was generally weak required max A to complete transfers, but sat on EOB unsupported for a few minutes, and after being helped to her feet was able to stand with BUE supported on a stable surface with +2 min A. Pt will benefit from PT services in a SNF setting upon discharge to safely address above deficits for decreased caregiver assistance and eventual return to PLOF.   Follow Up Recommendations  SNF     Equipment Recommendations  Other (comment)(TBD at next venue of care.)    Recommendations for Other Services       Precautions / Restrictions Precautions Precautions: Fall Precaution Comments: High fall risk Restrictions Weight Bearing Restrictions: Yes RUE Weight Bearing: Weight bearing as tolerated LUE Weight Bearing: Weight bearing as tolerated RLE Weight Bearing: Weight bearing as tolerated LLE Weight Bearing: Weight bearing as tolerated Other Position/Activity Restrictions: Per conversation with Dr Rosita Kea to clarify WB  order, pt okay to be WBAT to all four extremities.    Mobility  Bed Mobility Overal bed mobility: Needs Assistance Bed Mobility: Rolling;Sidelying to Sit;Supine to Sit Rolling: +2 for physical assistance;Max assist Sidelying to sit: +2 for physical assistance;Max assist Supine to sit: +2 for physical assistance;Max assist     General bed mobility comments: Pt was able to reposition BLE for easier bed positioning changes, but still required max A to complete.  Transfers Overall transfer level: Needs assistance Equipment used: Rolling walker (2 wheeled) Transfers: Sit to/from Stand Sit to Stand: +2 physical assistance;Mod assist         General transfer comment: Pt bed was moved close to room sink and cabinet to encourage anterior lean, and pt was able to plant feet, and required +2 mod A to come to and remain standing.  Ambulation/Gait   Gait Distance (Feet): 0 Feet     Gait velocity: NA   General Gait Details: Unable to advance either LE.   Stairs             Wheelchair Mobility    Modified Rankin (Stroke Patients Only)       Balance Overall balance assessment: Needs assistance Sitting-balance support: Feet supported;No upper extremity supported Sitting balance-Leahy Scale: Fair     Standing balance support: Bilateral upper extremity supported Standing balance-Leahy Scale: Poor                              Cognition Arousal/Alertness: Awake/alert Behavior During Therapy: WFL for tasks assessed/performed Overall Cognitive Status: Within Functional Limits for tasks assessed  Exercises Total Joint Exercises Ankle Circles/Pumps: AROM;Strengthening;Both;10 reps Quad Sets: AROM;Strengthening;Both;10 reps Gluteal Sets: Strengthening;Both;10 reps Hip ABduction/ADduction: AAROM;5 reps;Both;10 reps Long Arc Quad: AROM;Strengthening;Both;10 reps Knee Flexion: AROM;Strengthening;Both;10  reps Other Exercises Other Exercises: Pt in static and dynamic sitting on EOB for 3-4 minutes    General Comments        Pertinent Vitals/Pain Pain Assessment: No/denies pain Pain Score: 5  Pain Location: L hip and L ribs. Denies pain at rest, but increased to a 5/10 with functional movement. Pain Descriptors / Indicators: Aching;Constant;Guarding;Grimacing;Dull Pain Intervention(s): Limited activity within patient's tolerance;Monitored during session;Repositioned    Home Living                      Prior Function            PT Goals (current goals can now be found in the care plan section) Progress towards PT goals: Progressing toward goals    Frequency    7X/week      PT Plan Current plan remains appropriate    Co-evaluation              AM-PAC PT "6 Clicks" Mobility   Outcome Measure  Help needed turning from your back to your side while in a flat bed without using bedrails?: A Lot Help needed moving from lying on your back to sitting on the side of a flat bed without using bedrails?: Total Help needed moving to and from a bed to a chair (including a wheelchair)?: Total Help needed standing up from a chair using your arms (e.g., wheelchair or bedside chair)?: Total Help needed to walk in hospital room?: Total Help needed climbing 3-5 steps with a railing? : Total 6 Click Score: 7    End of Session Equipment Utilized During Treatment: Gait belt Activity Tolerance: Patient limited by fatigue Patient left: in bed;with call bell/phone within reach;with bed alarm set;with family/visitor present Nurse Communication: Mobility status PT Visit Diagnosis: Unsteadiness on feet (R26.81);Muscle weakness (generalized) (M62.81);Pain Pain - Right/Left: Left Pain - part of body: Hip(Ribs)     Time: 0254-2706 PT Time Calculation (min) (ACUTE ONLY): 38 min  Charges:                       Juanda Crumble "Gus" Ilamae Geng, SPT  03/02/19, 1:50 PM

## 2019-03-02 NOTE — Discharge Summary (Signed)
[facility] - [place] at [facility]   PATIENT NAME: Christina Mooney    MR#:  [id number]  DATE OF BIRTH:  04/26/1926  DATE OF ADMISSION:  02/27/2019 ADMITTING PHYSICIAN: Enedina Finner, MD  DATE OF DISCHARGE: 03/02/2019  PRIMARY CARE PHYSICIAN: Marguarite Arbour, MD   ADMISSION DIAGNOSIS:  Traumatic pneumothorax, initial encounter [S27.0XXA] Multiple fractures of ribs, left side, initial encounter for closed fracture [S22.42XA] Closed nondisplaced fracture of left pubis, initial encounter (HCC) [S32.502A]  DISCHARGE DIAGNOSIS:  Active Problems:   Fall   Pneumothorax, traumatic   Pneumothorax on left   SECONDARY DIAGNOSIS:   Past Medical History:  Diagnosis Date  . Chronic back pain   . Diverticulosis   . GERD (gastroesophageal reflux disease)   . Hyperlipidemia   . Hypertension   . Irritable bowel syndrome   . Lumbago   . Peripheral arterial disease (HCC)   . Spinal stenosis of lumbar region with neurogenic claudication      ADMITTING HISTORY  HISTORY OF PRESENT ILLNESS:  Christina Mooney  is a 85 y.o. female with a known history of hypertension, hyperlipidemia, peripheral arterial disease, past spinal stenosis comes to the emergency room from [facility] independent facility after she slept well going to the bathroom. Patient was brought to the emergency room found to have left 2nd to 4th rib fracture with left apical pneumothorax requiring chest tube placement in the emergency room. Dr. Lady Gary from surgery is aware. Patient also has acute Nondisplaced left obturator ring fractures through the inferior ramus and puboacetabular junction -- Dr. Rosita Kea aware. She is being admitted for further evaluation management  HOSPITAL COURSE:   FrancesChildsis a89 y.o.femalewith a known history of hypertension, hyperlipidemia, peripheral arterial disease, past spinal stenosis comes to the emergency room from [facility] independent facility after she slept well going  to the bathroom. Patient was brought to the emergency room found to have left 2nd to 4th rib fracture with left apical pneumothorax requiring chest replacement in the emergency room.  1.Left apicalpneumothorax with acute ribfracture 2nd to 4th rib post mechanical fall at [facility] independent living -patient underwent chest tube placement by ER physician -appreciate input from Dr. Thelma Barge cardiothoracic surgeon. - Pain well controlled wit Tramadol. Prescription given -patient's chest X ray shows pneumothorax resolved and  Chest tube was removed yesterday. -incentive spirometer Stable for d/c from hospital  2.Acute nondisplaced left upper curator ring fracture with inferior ramus and puboacetabular junction pst fall -Dr Rosita Kea input appreciated -PT to continue-- weight-bearing as tolerated  3.Hypertension continue home meds  4.Peripheral arterial disease continue Plavix  5.DVT prophylaxis subcu Lovenox in the hospital  D/c today to SNF  CONSULTS OBTAINED:  Treatment Team:  Duanne Guess, MD Kennedy Bucker, MD  DRUG ALLERGIES:   Allergies  Allergen Reactions  . Codeine Nausea And Vomiting  . Garlic Other (See Comments)    Reaction: unknown  . Milk-Related Compounds Other (See Comments)    Reaction: unknown  . Sorbitol Other (See Comments)    Reaction: unknown    DISCHARGE MEDICATIONS:   Allergies as of 03/02/2019      Reactions   Codeine Nausea And Vomiting   Garlic Other (See Comments)   Reaction: unknown   Milk-related Compounds Other (See Comments)   Reaction: unknown   Sorbitol Other (See Comments)   Reaction: unknown      Medication List    TAKE these medications   acetaminophen 500 MG tablet Commonly known as: TYLENOL Take 1,000 mg by mouth  2 (two) times daily.   cholecalciferol 10 MCG (400 UNIT) Tabs tablet Commonly known as: VITAMIN D3 Take 400 Units by mouth daily.   clopidogrel 75 MG tablet Commonly known as: PLAVIX TAKE 1 TABLET BY  MOUTH DAILY   diazepam 2 MG tablet Commonly known as: VALIUM Take 1 tablet (2 mg total) by mouth every 8 (eight) hours as needed (dizziness).   esomeprazole 40 MG capsule Commonly known as: NEXIUM Take 40 mg by mouth daily.   gabapentin 100 MG capsule Commonly known as: NEURONTIN Take 100 mg by mouth at bedtime.   metoprolol succinate 25 MG 24 hr tablet Commonly known as: TOPROL-XL Take 12.5 mg by mouth daily.   traMADol 50 MG tablet Commonly known as: ULTRAM Take 1 tablet (50 mg total) by mouth every 6 (six) hours as needed for moderate pain.       Today   VITAL SIGNS:  Blood pressure (!) 110/56, pulse 82, temperature 97.9 F (36.6 C), temperature source Oral, resp. rate 16, height 4\' 10"  (1.473 m), weight 43.1 kg, SpO2 93 %.  I/O:    Intake/Output Summary (Last 24 hours) at 03/02/2019 1153 Last data filed at 03/02/2019 0900 Gross per 24 hour  Intake 120 ml  Output 475 ml  Net -355 ml    PHYSICAL EXAMINATION:  Physical Exam  GENERAL:  85 y.o.-year-old patient lying in the bed with no acute distress.  LUNGS: Normal breath sounds bilaterally, no wheezing, rales,rhonchi or crepitation. No use of accessory muscles of respiration.  CARDIOVASCULAR: S1, S2 normal. No murmurs, rubs, or gallops.  ABDOMEN: Soft, non-tender, non-distended. Bowel sounds present. No organomegaly or mass.  NEUROLOGIC: Moves all 4 extremities. PSYCHIATRIC: The patient is alert and oriented x 3.  SKIN: No obvious rash, lesion, or ulcer.   DATA REVIEW:   CBC Recent Labs  Lab 02/28/19 0513 02/28/19 1335  WBC 16.8*  --   HGB 8.3* 8.5*  HCT 25.4*  --   PLT 272  --     Chemistries  Recent Labs  Lab 02/28/19 0513 03/01/19 0531  NA 140 140  K 3.0* 3.8  CL 105 107  CO2 29 27  GLUCOSE 113* 100*  BUN 16 16  CREATININE 0.47 0.50  CALCIUM 8.5* 8.6*  MG 2.1  --     Cardiac Enzymes No results for input(s): TROPONINI in the last 168 hours.  Microbiology Results  Results for  orders placed or performed during the hospital encounter of 02/27/19  SARS CORONAVIRUS 2 (TAT 6-24 HRS) Nasopharyngeal Nasopharyngeal Swab     Status: None   Collection Time: 02/27/19  7:45 AM   Specimen: Nasopharyngeal Swab  Result Value Ref Range Status   SARS Coronavirus 2 NEGATIVE NEGATIVE Final    Comment: (NOTE) SARS-CoV-2 target nucleic acids are NOT DETECTED. The SARS-CoV-2 RNA is generally detectable in upper and lower respiratory specimens during the acute phase of infection. Negative results do not preclude SARS-CoV-2 infection, do not rule out co-infections with other pathogens, and should not be used as the sole basis for treatment or other patient management decisions. Negative results must be combined with clinical observations, patient history, and epidemiological information. The expected result is Negative. Fact Sheet for Patients: SugarRoll.be Fact Sheet for Healthcare Providers: https://www.woods-mathews.com/ This test is not yet approved or cleared by the [place] FDA and  has been authorized for detection and/or diagnosis of SARS-CoV-2 by FDA under an Emergency Use Authorization (EUA). This EUA will remain  in effect (meaning this test  can be used) for the duration of the COVID-19 declaration under Section 56 4(b)(1) of the Act, 21 U.S.C. section 360bbb-3(b)(1), unless the authorization is terminated or revoked sooner. Performed at [facility], [address] [place], [place] [address]     RADIOLOGY:  Dg Chest Port 1 View  Result Date: 02/28/2019 CLINICAL DATA:  Postoperative check. Left-sided pneumothorax. EXAM: PORTABLE CHEST 1 VIEW COMPARISON:  One-view chest x-ray at 6:30 a.m. FINDINGS: Heart is mildly enlarged. Atherosclerotic calcifications are present at the aortic arch. There is no edema. Left-sided chest tube is in place. No significant residual pneumothorax is present. IMPRESSION: 1. No  significant residual pneumothorax. 2. Left-sided chest tube in place. 3. Mild cardiomegaly without failure. Electronically Signed   By: Marin Robertshristopher  Mattern M.D.   On: 02/28/2019 15:21    Follow up with PCP in 1 week.  Management plans discussed with the patient, family and they are in agreement.  CODE STATUS:     Code Status Orders  (From admission, onward)         Start     Ordered   02/27/19 0903  Full code  Continuous     02/27/19 0902        Code Status History    This patient has a current code status but no historical code status.   Advance Care Planning Activity    Advance Directive Documentation     Most Recent Value  Type of Advance Directive  Healthcare Power of Attorney, Living will  Pre-existing out of facility DNR order (yellow form or pink MOST form)  -  "MOST" Form in Place?  -      TOTAL TIME TAKING CARE OF THIS PATIENT ON DAY OF DISCHARGE: more than 30 minutes.   Molinda BailiffSrikar R Shastina Rua M.D on 03/02/2019 at 11:53 AM  Between 7am to 6pm - Pager - [phone]  After 6pm go to [email or web address] - password [email or web address] [facility]  [facility] [place] [facility]  Office  [phone]  CC: Primary care physician; Marguarite ArbourSparks, Jeffrey D, MD  Note: This dictation was prepared with Dragon dictation along with smaller phrase technology. Any transcriptional errors that result from this process are unintentional.

## 2019-03-06 DIAGNOSIS — S2232XA Fracture of one rib, left side, initial encounter for closed fracture: Secondary | ICD-10-CM

## 2019-03-06 DIAGNOSIS — I10 Essential (primary) hypertension: Secondary | ICD-10-CM

## 2019-03-06 DIAGNOSIS — E441 Mild protein-calorie malnutrition: Secondary | ICD-10-CM | POA: Diagnosis not present

## 2019-03-06 DIAGNOSIS — K219 Gastro-esophageal reflux disease without esophagitis: Secondary | ICD-10-CM | POA: Diagnosis not present

## 2019-03-06 DIAGNOSIS — S3289XA Fracture of other parts of pelvis, initial encounter for closed fracture: Secondary | ICD-10-CM

## 2019-03-06 DIAGNOSIS — I5032 Chronic diastolic (congestive) heart failure: Secondary | ICD-10-CM | POA: Diagnosis not present

## 2019-03-16 ENCOUNTER — Encounter (INDEPENDENT_AMBULATORY_CARE_PROVIDER_SITE_OTHER): Payer: Medicare Other

## 2019-03-16 ENCOUNTER — Ambulatory Visit (INDEPENDENT_AMBULATORY_CARE_PROVIDER_SITE_OTHER): Payer: Medicare Other | Admitting: Vascular Surgery

## 2019-03-16 DIAGNOSIS — K219 Gastro-esophageal reflux disease without esophagitis: Secondary | ICD-10-CM

## 2019-03-16 DIAGNOSIS — E441 Mild protein-calorie malnutrition: Secondary | ICD-10-CM

## 2019-03-16 DIAGNOSIS — I5032 Chronic diastolic (congestive) heart failure: Secondary | ICD-10-CM

## 2019-03-16 DIAGNOSIS — S3289XA Fracture of other parts of pelvis, initial encounter for closed fracture: Secondary | ICD-10-CM

## 2019-03-16 DIAGNOSIS — S2232XA Fracture of one rib, left side, initial encounter for closed fracture: Secondary | ICD-10-CM

## 2019-03-31 DIAGNOSIS — H04129 Dry eye syndrome of unspecified lacrimal gland: Secondary | ICD-10-CM | POA: Diagnosis not present

## 2019-04-11 DIAGNOSIS — G3184 Mild cognitive impairment, so stated: Secondary | ICD-10-CM | POA: Diagnosis not present

## 2019-04-17 ENCOUNTER — Ambulatory Visit (INDEPENDENT_AMBULATORY_CARE_PROVIDER_SITE_OTHER): Payer: Medicare PPO | Admitting: Vascular Surgery

## 2019-04-17 ENCOUNTER — Ambulatory Visit (INDEPENDENT_AMBULATORY_CARE_PROVIDER_SITE_OTHER): Payer: Medicare PPO

## 2019-04-19 DIAGNOSIS — E43 Unspecified severe protein-calorie malnutrition: Secondary | ICD-10-CM | POA: Diagnosis not present

## 2019-04-19 DIAGNOSIS — I5030 Unspecified diastolic (congestive) heart failure: Secondary | ICD-10-CM

## 2019-04-19 DIAGNOSIS — I10 Essential (primary) hypertension: Secondary | ICD-10-CM | POA: Diagnosis not present

## 2019-04-19 DIAGNOSIS — K219 Gastro-esophageal reflux disease without esophagitis: Secondary | ICD-10-CM | POA: Diagnosis not present

## 2019-04-19 DIAGNOSIS — I7090 Unspecified atherosclerosis: Secondary | ICD-10-CM

## 2019-04-19 DIAGNOSIS — S2239XB Fracture of one rib, unspecified side, initial encounter for open fracture: Secondary | ICD-10-CM

## 2019-04-19 DIAGNOSIS — S329XXS Fracture of unspecified parts of lumbosacral spine and pelvis, sequela: Secondary | ICD-10-CM

## 2019-04-19 DIAGNOSIS — M5416 Radiculopathy, lumbar region: Secondary | ICD-10-CM

## 2019-04-26 DIAGNOSIS — B351 Tinea unguium: Secondary | ICD-10-CM

## 2019-05-02 ENCOUNTER — Other Ambulatory Visit (INDEPENDENT_AMBULATORY_CARE_PROVIDER_SITE_OTHER): Payer: Self-pay | Admitting: Vascular Surgery

## 2019-05-11 DIAGNOSIS — S52011A Torus fracture of upper end of right ulna, initial encounter for closed fracture: Secondary | ICD-10-CM

## 2019-05-11 DIAGNOSIS — S61402A Unspecified open wound of left hand, initial encounter: Secondary | ICD-10-CM | POA: Diagnosis not present

## 2019-05-11 DIAGNOSIS — M25551 Pain in right hip: Secondary | ICD-10-CM

## 2019-05-15 DIAGNOSIS — S3289XA Fracture of other parts of pelvis, initial encounter for closed fracture: Secondary | ICD-10-CM | POA: Diagnosis not present

## 2019-06-06 DIAGNOSIS — E441 Mild protein-calorie malnutrition: Secondary | ICD-10-CM

## 2019-06-06 DIAGNOSIS — K219 Gastro-esophageal reflux disease without esophagitis: Secondary | ICD-10-CM

## 2019-06-06 DIAGNOSIS — I10 Essential (primary) hypertension: Secondary | ICD-10-CM | POA: Diagnosis not present

## 2019-06-06 DIAGNOSIS — S329XXS Fracture of unspecified parts of lumbosacral spine and pelvis, sequela: Secondary | ICD-10-CM | POA: Diagnosis not present

## 2019-06-06 DIAGNOSIS — I5032 Chronic diastolic (congestive) heart failure: Secondary | ICD-10-CM | POA: Diagnosis not present

## 2019-06-28 DIAGNOSIS — L570 Actinic keratosis: Secondary | ICD-10-CM | POA: Diagnosis not present

## 2019-07-18 ENCOUNTER — Encounter: Payer: Self-pay | Admitting: Emergency Medicine

## 2019-07-18 ENCOUNTER — Other Ambulatory Visit: Payer: Self-pay

## 2019-07-18 ENCOUNTER — Emergency Department: Payer: Medicare PPO

## 2019-07-18 ENCOUNTER — Inpatient Hospital Stay
Admission: EM | Admit: 2019-07-18 | Discharge: 2019-07-21 | DRG: 480 | Disposition: A | Discharge status: Skilled Nursing Facility | Payer: Medicare PPO | Source: Skilled Nursing Facility | Attending: Internal Medicine | Admitting: Internal Medicine

## 2019-07-18 DIAGNOSIS — S72355A Nondisplaced comminuted fracture of shaft of left femur, initial encounter for closed fracture: Secondary | ICD-10-CM

## 2019-07-18 DIAGNOSIS — K589 Irritable bowel syndrome without diarrhea: Secondary | ICD-10-CM | POA: Diagnosis present

## 2019-07-18 DIAGNOSIS — Z79891 Long term (current) use of opiate analgesic: Secondary | ICD-10-CM

## 2019-07-18 DIAGNOSIS — E785 Hyperlipidemia, unspecified: Secondary | ICD-10-CM | POA: Diagnosis present

## 2019-07-18 DIAGNOSIS — Z8249 Family history of ischemic heart disease and other diseases of the circulatory system: Secondary | ICD-10-CM | POA: Diagnosis not present

## 2019-07-18 DIAGNOSIS — I700 Atherosclerosis of aorta: Secondary | ICD-10-CM | POA: Diagnosis present

## 2019-07-18 DIAGNOSIS — Z20822 Contact with and (suspected) exposure to covid-19: Secondary | ICD-10-CM | POA: Diagnosis present

## 2019-07-18 DIAGNOSIS — Z888 Allergy status to other drugs, medicaments and biological substances status: Secondary | ICD-10-CM | POA: Diagnosis not present

## 2019-07-18 DIAGNOSIS — Z823 Family history of stroke: Secondary | ICD-10-CM | POA: Diagnosis not present

## 2019-07-18 DIAGNOSIS — E43 Unspecified severe protein-calorie malnutrition: Secondary | ICD-10-CM | POA: Insufficient documentation

## 2019-07-18 DIAGNOSIS — W19XXXA Unspecified fall, initial encounter: Secondary | ICD-10-CM | POA: Diagnosis present

## 2019-07-18 DIAGNOSIS — M48062 Spinal stenosis, lumbar region with neurogenic claudication: Secondary | ICD-10-CM | POA: Diagnosis present

## 2019-07-18 DIAGNOSIS — W1830XA Fall on same level, unspecified, initial encounter: Secondary | ICD-10-CM | POA: Diagnosis present

## 2019-07-18 DIAGNOSIS — R296 Repeated falls: Secondary | ICD-10-CM | POA: Diagnosis present

## 2019-07-18 DIAGNOSIS — Z91011 Allergy to milk products: Secondary | ICD-10-CM

## 2019-07-18 DIAGNOSIS — I6529 Occlusion and stenosis of unspecified carotid artery: Secondary | ICD-10-CM | POA: Diagnosis present

## 2019-07-18 DIAGNOSIS — I5032 Chronic diastolic (congestive) heart failure: Secondary | ICD-10-CM | POA: Diagnosis present

## 2019-07-18 DIAGNOSIS — I10 Essential (primary) hypertension: Secondary | ICD-10-CM | POA: Diagnosis present

## 2019-07-18 DIAGNOSIS — Z7902 Long term (current) use of antithrombotics/antiplatelets: Secondary | ICD-10-CM | POA: Diagnosis not present

## 2019-07-18 DIAGNOSIS — Z681 Body mass index (BMI) 19 or less, adult: Secondary | ICD-10-CM | POA: Diagnosis not present

## 2019-07-18 DIAGNOSIS — K219 Gastro-esophageal reflux disease without esophagitis: Secondary | ICD-10-CM | POA: Diagnosis present

## 2019-07-18 DIAGNOSIS — Z91018 Allergy to other foods: Secondary | ICD-10-CM

## 2019-07-18 DIAGNOSIS — I110 Hypertensive heart disease with heart failure: Secondary | ICD-10-CM | POA: Diagnosis present

## 2019-07-18 DIAGNOSIS — D638 Anemia in other chronic diseases classified elsewhere: Secondary | ICD-10-CM | POA: Diagnosis present

## 2019-07-18 DIAGNOSIS — I739 Peripheral vascular disease, unspecified: Secondary | ICD-10-CM | POA: Diagnosis present

## 2019-07-18 DIAGNOSIS — S72402A Unspecified fracture of lower end of left femur, initial encounter for closed fracture: Principal | ICD-10-CM | POA: Diagnosis present

## 2019-07-18 DIAGNOSIS — Z79899 Other long term (current) drug therapy: Secondary | ICD-10-CM

## 2019-07-18 DIAGNOSIS — S20211A Contusion of right front wall of thorax, initial encounter: Secondary | ICD-10-CM | POA: Diagnosis present

## 2019-07-18 DIAGNOSIS — G8929 Other chronic pain: Secondary | ICD-10-CM | POA: Diagnosis present

## 2019-07-18 DIAGNOSIS — Z885 Allergy status to narcotic agent status: Secondary | ICD-10-CM | POA: Diagnosis not present

## 2019-07-18 DIAGNOSIS — S7292XA Unspecified fracture of left femur, initial encounter for closed fracture: Secondary | ICD-10-CM | POA: Diagnosis not present

## 2019-07-18 DIAGNOSIS — Z419 Encounter for procedure for purposes other than remedying health state, unspecified: Secondary | ICD-10-CM

## 2019-07-18 DIAGNOSIS — I6523 Occlusion and stenosis of bilateral carotid arteries: Secondary | ICD-10-CM | POA: Diagnosis not present

## 2019-07-18 LAB — COMPREHENSIVE METABOLIC PANEL
ALT: 19 U/L (ref 0–44)
AST: 18 U/L (ref 15–41)
Albumin: 3.8 g/dL (ref 3.5–5.0)
Alkaline Phosphatase: 73 U/L (ref 38–126)
Anion gap: 12 (ref 5–15)
BUN: 19 mg/dL (ref 8–23)
CO2: 23 mmol/L (ref 22–32)
Calcium: 9.3 mg/dL (ref 8.9–10.3)
Chloride: 101 mmol/L (ref 98–111)
Creatinine, Ser: 0.55 mg/dL (ref 0.44–1.00)
GFR calc Af Amer: >60 mL/min (ref >60)
GFR calc non Af Amer: >60 mL/min (ref >60)
Glucose, Bld: 123 mg/dL — ABNORMAL HIGH (ref 70–99)
Potassium: 3.7 mmol/L (ref 3.5–5.1)
Sodium: 136 mmol/L (ref 135–145)
Total Bilirubin: 1.1 mg/dL (ref 0.3–1.2)
Total Protein: 6.8 g/dL (ref 6.5–8.1)

## 2019-07-18 LAB — URINALYSIS, COMPLETE (UACMP) WITH MICROSCOPIC
Bilirubin Urine: NEGATIVE
Glucose, UA: NEGATIVE mg/dL
Ketones, ur: NEGATIVE mg/dL
Leukocytes,Ua: NEGATIVE
Nitrite: POSITIVE — AB
Protein, ur: 30 mg/dL — AB
Specific Gravity, Urine: 1.018 (ref 1.005–1.030)
Squamous Epithelial / HPF: NONE SEEN (ref 0–5)
pH: 7 (ref 5.0–8.0)

## 2019-07-18 LAB — CBC WITH DIFFERENTIAL/PLATELET
Abs Immature Granulocytes: 0.05 10*3/uL (ref 0.00–0.07)
Basophils Absolute: 0 10*3/uL (ref 0.0–0.1)
Basophils Relative: 0 %
Eosinophils Absolute: 0 10*3/uL (ref 0.0–0.5)
Eosinophils Relative: 0 %
HCT: 31 % — ABNORMAL LOW (ref 36.0–46.0)
Hemoglobin: 9.7 g/dL — ABNORMAL LOW (ref 12.0–15.0)
Immature Granulocytes: 0 %
Lymphocytes Relative: 2 %
Lymphs Abs: 0.3 10*3/uL — ABNORMAL LOW (ref 0.7–4.0)
MCH: 23.7 pg — ABNORMAL LOW (ref 26.0–34.0)
MCHC: 31.3 g/dL (ref 30.0–36.0)
MCV: 75.6 fL — ABNORMAL LOW (ref 80.0–100.0)
Monocytes Absolute: 0.5 10*3/uL (ref 0.1–1.0)
Monocytes Relative: 4 %
Neutro Abs: 13.6 10*3/uL — ABNORMAL HIGH (ref 1.7–7.7)
Neutrophils Relative %: 94 %
Platelets: 333 10*3/uL (ref 150–400)
RBC: 4.1 MIL/uL (ref 3.87–5.11)
RDW: 16.4 % — ABNORMAL HIGH (ref 11.5–15.5)
WBC: 14.5 10*3/uL — ABNORMAL HIGH (ref 4.0–10.5)
nRBC: 0 % (ref 0.0–0.2)

## 2019-07-18 LAB — TROPONIN I (HIGH SENSITIVITY)
Troponin I (High Sensitivity): 6 ng/L (ref <18)
Troponin I (High Sensitivity): 11 ng/L (ref <18)

## 2019-07-18 LAB — RESPIRATORY PANEL BY RT PCR (FLU A&B, COVID)
Influenza A by PCR: NEGATIVE
Influenza B by PCR: NEGATIVE
SARS Coronavirus 2 by RT PCR: NEGATIVE

## 2019-07-18 MED ORDER — TRAMADOL HCL 50 MG PO TABS
50.0000 mg | ORAL_TABLET | Freq: Four times a day (QID) | ORAL | Status: DC | PRN
  Filled 2019-07-18: qty 1

## 2019-07-18 MED ORDER — BISACODYL 10 MG RE SUPP
10.0000 mg | Freq: Every day | RECTAL | Status: DC | PRN
  Filled 2019-07-18: qty 1

## 2019-07-18 MED ORDER — GABAPENTIN 100 MG PO CAPS
100.0000 mg | ORAL_CAPSULE | Freq: Every day | ORAL | Status: DC
  Administered 2019-07-18 – 2019-07-20 (×3): 100 mg via ORAL
  Filled 2019-07-18 (×3): qty 1

## 2019-07-18 MED ORDER — MORPHINE SULFATE (PF) 2 MG/ML IV SOLN
0.5000 mg | INTRAVENOUS | Status: DC | PRN
  Administered 2019-07-18 – 2019-07-19 (×4): 0.5 mg via INTRAVENOUS
  Filled 2019-07-18 (×4): qty 1

## 2019-07-18 MED ORDER — METOPROLOL SUCCINATE ER 25 MG PO TB24
12.5000 mg | ORAL_TABLET | Freq: Every day | ORAL | Status: DC
  Administered 2019-07-19 – 2019-07-21 (×3): 12.5 mg via ORAL
  Filled 2019-07-18 (×3): qty 1

## 2019-07-18 MED ORDER — MORPHINE SULFATE (PF) 2 MG/ML IV SOLN
2.0000 mg | Freq: Once | INTRAVENOUS | Status: AC
  Administered 2019-07-18: 10:00:00 2 mg via INTRAVENOUS
  Filled 2019-07-18: qty 1

## 2019-07-18 MED ORDER — MORPHINE SULFATE (PF) 4 MG/ML IV SOLN
4.0000 mg | Freq: Once | INTRAVENOUS | Status: AC
  Administered 2019-07-18: 11:00:00 4 mg via INTRAVENOUS
  Filled 2019-07-18: qty 1

## 2019-07-18 MED ORDER — SODIUM CHLORIDE 0.9 % IV SOLN: INTRAVENOUS | Status: DC

## 2019-07-18 MED ORDER — HYDROCODONE-ACETAMINOPHEN 5-325 MG PO TABS: 1.0000 | ORAL_TABLET | Freq: Four times a day (QID) | ORAL | Status: DC | PRN

## 2019-07-18 MED ORDER — CHOLECALCIFEROL 10 MCG (400 UNIT) PO TABS
400.0000 [IU] | ORAL_TABLET | Freq: Every day | ORAL | Status: DC
  Administered 2019-07-20 – 2019-07-21 (×2): 400 [IU] via ORAL
  Filled 2019-07-18 (×3): qty 1

## 2019-07-18 MED ORDER — PANTOPRAZOLE SODIUM 40 MG PO TBEC
40.0000 mg | DELAYED_RELEASE_TABLET | Freq: Every day | ORAL | Status: DC
  Administered 2019-07-19 – 2019-07-21 (×3): 40 mg via ORAL
  Filled 2019-07-18 (×3): qty 1

## 2019-07-18 NOTE — ED Provider Notes (Signed)
[facility] Emergency Department Provider Note       Time seen: ----------------------------------------- 8:21 AM on 07/18/2019 -----------------------------------------   I have reviewed the triage vital signs and the nursing notes.  HISTORY Chief Complaint No chief complaint on file.   HPI Christina Mooney is a 86 y.o. female with a history of GERD, hyperlipidemia, hypertension, IBS, peripheral vascular disease who presents to the ED for a fall.  Patient reportedly fell, comes from the [facility] facility.  She is complaining of right-sided rib pain, left knee pain, left hip pain.  She also states she did hit her head.  Outpatient x-rays were initiated today due to the fall and there is a reported left knee fracture.  Patient has taken some tramadol with some improvement in her pain.  Past Medical History:  Diagnosis Date  . Chronic back pain   . Diverticulosis   . GERD (gastroesophageal reflux disease)   . Hyperlipidemia   . Hypertension   . Irritable bowel syndrome   . Lumbago   . Peripheral arterial disease (HCC)   . Spinal stenosis of lumbar region with neurogenic claudication     Patient Active Problem List   Diagnosis Date Noted  . Pneumothorax on left   . Pneumothorax, traumatic   . Fall 02/27/2019  . GERD (gastroesophageal reflux disease) 12/28/2015  . Carotid artery stenosis 12/28/2015  . Aortic atherosclerosis (HCC) 12/28/2015  . Hyperlipidemia 12/28/2015  . Essential hypertension, benign 12/28/2015  . Fibromuscular dysplasia (HCC) 12/28/2015    Past Surgical History:  Procedure Laterality Date  . APPENDECTOMY    . BREAST BIOPSY Right 1980   neg    Allergies Codeine, Garlic, Milk-related compounds, and Sorbitol  Social History Social History   Tobacco Use  . Smoking status: Never Smoker  . Smokeless tobacco: Never Used  Substance Use Topics  . Alcohol use: No  . Drug use: No    Review of Systems Constitutional:  Negative for fever. Cardiovascular: Negative for chest pain. Respiratory: Negative for shortness of breath. Gastrointestinal: Negative for abdominal pain, vomiting and diarrhea. Musculoskeletal: Positive for knee and hip pain, rib pain Skin: Negative for rash. Neurological: Positive for headache  All systems negative/normal/unremarkable except as stated in the HPI  ____________________________________________   PHYSICAL EXAM:  VITAL SIGNS: ED Triage Vitals  Enc Vitals Group     BP      Pulse      Resp      Temp      Temp src      SpO2      Weight      Height      Head Circumference      Peak Flow      Pain Score      Pain Loc      Pain Edu?      Excl. in GC?     Constitutional: Alert and oriented.  Mild distress Eyes: Conjunctivae are normal. Normal extraocular movements. ENT      Head: Normocephalic and atraumatic.      Nose: No congestion/rhinnorhea.      Mouth/Throat: Mucous membranes are moist.      Neck: No stridor. Cardiovascular: Normal rate, regular rhythm. No murmurs, rubs, or gallops. Respiratory: Normal respiratory effort without tachypnea nor retractions. Breath sounds are clear and equal bilaterally. No wheezes/rales/rhonchi. Gastrointestinal: Soft and nontender. Normal bowel sounds Musculoskeletal: Limited range of motion of the left knee, left knee effusion is noted, tenderness over the left hip, tenderness  over the right lateral ribs Neurologic:  Normal speech and language. No gross focal neurologic deficits are appreciated.  Skin:  Skin is warm, dry and intact. No rash noted. Psychiatric: Mood and affect are normal. Speech and behavior are normal.  ____________________________________________  EKG: Interpreted by me.  Sinus rhythm with rate of 92 bpm, abnormal inferior Q waves, normal axis, normal QT  ____________________________________________  ED COURSE:  As part of my medical decision making, I reviewed the following data within the  [place] History obtained from family if available, nursing notes, old chart and ekg, as well as notes from prior ED visits. Patient presented for a fall, we will assess with labs and imaging as indicated at this time.   Procedures  Christina Mooney was evaluated in Emergency Department on 07/18/2019 for the symptoms described in the history of present illness. She was evaluated in the context of the global COVID-19 pandemic, which necessitated consideration that the patient might be at risk for infection with the SARS-CoV-2 virus that causes COVID-19. Institutional protocols and algorithms that pertain to the evaluation of patients at risk for COVID-19 are in a state of rapid change based on information released by regulatory bodies including the CDC and federal and state organizations. These policies and algorithms were followed during the patient's care in the ED.  ____________________________________________   LABS (pertinent positives/negatives)  Labs Reviewed  CBC WITH DIFFERENTIAL/PLATELET - Abnormal; Notable for the following components:      Result Value   WBC 14.5 (*)    Hemoglobin 9.7 (*)    HCT 31.0 (*)    MCV 75.6 (*)    MCH 23.7 (*)    RDW 16.4 (*)    Neutro Abs 13.6 (*)    Lymphs Abs 0.3 (*)    All other components within normal limits  COMPREHENSIVE METABOLIC PANEL  URINALYSIS, COMPLETE (UACMP) WITH MICROSCOPIC  CBG MONITORING, ED  TROPONIN I (HIGH SENSITIVITY)  TROPONIN I (HIGH SENSITIVITY)    RADIOLOGY Images were viewed by me  CT head, chest x-ray with right rib views, left knee x-ray, left hip x-ray IMPRESSION:  1. Stable head CT, no acute intrathoracic process.  IMPRESSION:  1. Impacted and angulated distal left femoral metaphyseal fracture,  with small associated lipohemarthrosis.  IMPRESSION:  1. No acute intrathoracic process.   IMPRESSION:  1. Chronic bilateral superior and inferior pubic rami fractures.  2. No acute displaced  fracture.  3. Severe osteopenia.   ____________________________________________   DIFFERENTIAL DIAGNOSIS   Fall, contusion, femur fracture, subdural, pneumothorax, rib fracture  FINAL ASSESSMENT AND PLAN  Fall, distal left femur fracture   Plan: The patient had presented for a fall. Patient's labs did indicate some leukocytosis and chronic anemia. Patient's imaging revealed a distal left femur fracture which is impacted and angulated.  I have discussed with orthopedics who recommends hospitalist admission.  I will consult the hospitalist service.  She has been given morphine for pain.   Laurence Aly, MD    Note: This note was generated in part or whole with voice recognition software. Voice recognition is usually quite accurate but there are transcription errors that can and very often do occur. I apologize for any typographical errors that were not detected and corrected.     Earleen Newport, MD 07/18/19 [phone]

## 2019-07-18 NOTE — ED Notes (Signed)
AAOx3.  Skin warm and dry.  NAD 

## 2019-07-18 NOTE — ED Notes (Signed)
ED TO INPATIENT HANDOFF REPORT  ED Nurse Name and Phone #: [phone]  S Name/Age/Gender Christina Mooney 86 y.o. female Room/Bed: ED03A/ED03A  Code Status   Code Status: Full Code  Home/SNF/Other Nursing Home Patient oriented to: self, place, time and situation Is this baseline? Yes   Triage Complete: Triage complete  Chief Complaint Closed fracture of distal end of left femur, unspecified fracture morphology, sequela [S72.402S]  Triage Note Pt ems from [facility] s/p fall. Pt was getting up with walker, lost balance and fell. Pt c/o left knee, hip, rib pain. Left knee bruised and swollen. Pt denies dizziness or weakness before fall.    Allergies Allergies  Allergen Reactions  . Codeine Nausea And Vomiting  . Garlic Other (See Comments)    indigestion  . Milk-Related Compounds Other (See Comments)    Diarrhea/cramping  . Sorbitol Other (See Comments)    Diarrhea/cramping    Level of Care/Admitting Diagnosis ED Disposition    ED Disposition Condition Comment   Admit  Hospital Area: [place] [[id number]]  Level of Care: Med-Surg [16]  Covid Evaluation: Confirmed COVID Negative  Date Laboratory Confirmed COVID Negative: 07/18/2019  Diagnosis: Closed fracture of distal end of left femur, unspecified fracture morphology, sequela [[id number]]  Admitting Physician: Gary Fleet  Attending Physician: Gary Fleet  Estimated length of stay: 3 - 4 days  Certification:: I certify this patient will need inpatient services for at least 2 midnights       B Medical/Surgery History Past Medical History:  Diagnosis Date  . Chronic back pain   . Diverticulosis   . GERD (gastroesophageal reflux disease)   . Hyperlipidemia   . Hypertension   . Irritable bowel syndrome   . Lumbago   . Peripheral arterial disease ([place])   . Spinal stenosis of lumbar region with neurogenic claudication    Past Surgical History:  Procedure  Laterality Date  . APPENDECTOMY    . BREAST BIOPSY Right 1980   neg     A IV Location/Drains/Wounds Patient Lines/Drains/Airways Status   Active Line/Drains/Airways    Name:   Placement date:   Placement time:   Site:   Days:   Peripheral IV 02/28/19 Left Forearm   02/28/19    1336    Forearm   140   Peripheral IV 07/18/19 Right Forearm   07/18/19    0929    Forearm   less than 1   External Urinary Catheter   03/01/19    1700    --   139          Intake/Output Last 24 hours No intake or output data in the 24 hours ending 07/18/19 1410  Labs/Imaging Results for orders placed or performed during the hospital encounter of 07/18/19 (from the past 48 hour(s))  CBC with Differential     Status: Abnormal   Collection Time: 07/18/19  9:22 AM  Result Value Ref Range   WBC 14.5 (H) 4.0 - 10.5 K/uL   RBC 4.10 3.87 - 5.11 MIL/uL   Hemoglobin 9.7 (L) 12.0 - 15.0 g/dL   HCT 31.0 (L) 36.0 - 46.0 %   MCV 75.6 (L) 80.0 - 100.0 fL   MCH 23.7 (L) 26.0 - 34.0 pg   MCHC 31.3 30.0 - 36.0 g/dL   RDW 16.4 (H) 11.5 - 15.5 %   Platelets 333 150 - 400 K/uL   nRBC 0.0 0.0 - 0.2 %   Neutrophils Relative % 94 %  Neutro Abs 13.6 (H) 1.7 - 7.7 K/uL   Lymphocytes Relative 2 %   Lymphs Abs 0.3 (L) 0.7 - 4.0 K/uL   Monocytes Relative 4 %   Monocytes Absolute 0.5 0.1 - 1.0 K/uL   Eosinophils Relative 0 %   Eosinophils Absolute 0.0 0.0 - 0.5 K/uL   Basophils Relative 0 %   Basophils Absolute 0.0 0.0 - 0.1 K/uL   Immature Granulocytes 0 %   Abs Immature Granulocytes 0.05 0.00 - 0.07 K/uL    Comment: Performed at [facility], [address] [place], [place] [address]  Comprehensive metabolic panel     Status: Abnormal   Collection Time: 07/18/19  9:22 AM  Result Value Ref Range   Sodium 136 135 - 145 mmol/L   Potassium 3.7 3.5 - 5.1 mmol/L   Chloride 101 98 - 111 mmol/L   CO2 23 22 - 32 mmol/L   Glucose, Bld 123 (H) 70 - 99 mg/dL    Comment: Glucose reference range applies only to  samples taken after fasting for at least 8 hours.   BUN 19 8 - 23 mg/dL   Creatinine, Ser 7.42 0.44 - 1.00 mg/dL   Calcium 9.3 8.9 - 59.5 mg/dL   Total Protein 6.8 6.5 - 8.1 g/dL   Albumin 3.8 3.5 - 5.0 g/dL   AST 18 15 - 41 U/L   ALT 19 0 - 44 U/L   Alkaline Phosphatase 73 38 - 126 U/L   Total Bilirubin 1.1 0.3 - 1.2 mg/dL   GFR calc non Af Amer >60 >60 mL/min   GFR calc Af Amer >60 >60 mL/min   Anion gap 12 5 - 15    Comment: Performed at [facility], [address] [place], [place] [address]  Urinalysis, Complete w Microscopic     Status: Abnormal   Collection Time: 07/18/19  9:22 AM  Result Value Ref Range   Color, Urine YELLOW (A) YELLOW   APPearance HAZY (A) CLEAR   Specific Gravity, Urine 1.018 1.005 - 1.030   pH 7.0 5.0 - 8.0   Glucose, UA NEGATIVE NEGATIVE mg/dL   Hgb urine dipstick MODERATE (A) NEGATIVE   Bilirubin Urine NEGATIVE NEGATIVE   Ketones, ur NEGATIVE NEGATIVE mg/dL   Protein, ur 30 (A) NEGATIVE mg/dL   Nitrite POSITIVE (A) NEGATIVE   Leukocytes,Ua NEGATIVE NEGATIVE   RBC / HPF 0-5 0 - 5 RBC/hpf   WBC, UA 0-5 0 - 5 WBC/hpf   Bacteria, UA RARE (A) NONE SEEN   Squamous Epithelial / LPF NONE SEEN 0 - 5    Comment: Performed at [facility], [address], [place], [place] [address]  Troponin I (High Sensitivity)     Status: None   Collection Time: 07/18/19  9:22 AM  Result Value Ref Range   Troponin I (High Sensitivity) 6 <18 ng/L    Comment: (NOTE) Elevated high sensitivity troponin I (hsTnI) values and significant  changes across serial measurements may suggest ACS but many other  chronic and acute conditions are known to elevate hsTnI results.  Refer to the "Links" section for chest pain algorithms and additional  guidance. Performed at [facility], [address] [place], [place] [address]   Troponin I (High Sensitivity)     Status: None   Collection Time: 07/18/19 11:25 AM  Result Value Ref Range   Troponin I  (High Sensitivity) 11 <18 ng/L    Comment: (NOTE) Elevated high sensitivity troponin I (hsTnI) values  and significant  changes across serial measurements may suggest ACS but many other  chronic and acute conditions are known to elevate hsTnI results.  Refer to the "Links" section for chest pain algorithms and additional  guidance. Performed at [facility], [address], [place], [place] [address]   Respiratory Panel by RT PCR (Flu A&B, Covid) - Nasopharyngeal Swab     Status: None   Collection Time: 07/18/19 11:25 AM   Specimen: Nasopharyngeal Swab  Result Value Ref Range   SARS Coronavirus 2 by RT PCR NEGATIVE NEGATIVE    Comment: (NOTE) SARS-CoV-2 target nucleic acids are NOT DETECTED. The SARS-CoV-2 RNA is generally detectable in upper respiratoy specimens during the acute phase of infection. The lowest concentration of SARS-CoV-2 viral copies this assay can detect is 131 copies/mL. A negative result does not preclude SARS-Cov-2 infection and should not be used as the sole basis for treatment or other patient management decisions. A negative result may occur with  improper specimen collection/handling, submission of specimen other than nasopharyngeal swab, presence of viral mutation(s) within the areas targeted by this assay, and inadequate number of viral copies (<131 copies/mL). A negative result must be combined with clinical observations, patient history, and epidemiological information. The expected result is Negative. Fact Sheet for Patients:  https://www.moore.com/ Fact Sheet for Healthcare Providers:  https://www.young.biz/ This test is not yet ap proved or cleared by the [place] FDA and  has been authorized for detection and/or diagnosis of SARS-CoV-2 by FDA under an Emergency Use Authorization (EUA). This EUA will remain  in effect (meaning this test can be used) for the duration of the COVID-19 declaration  under Section 564(b)(1) of the Act, 21 U.S.C. section 360bbb-3(b)(1), unless the authorization is terminated or revoked sooner.    Influenza A by PCR NEGATIVE NEGATIVE   Influenza B by PCR NEGATIVE NEGATIVE    Comment: (NOTE) The Xpert Xpress SARS-CoV-2/FLU/RSV assay is intended as an aid in  the diagnosis of influenza from Nasopharyngeal swab specimens and  should not be used as a sole basis for treatment. Nasal washings and  aspirates are unacceptable for Xpert Xpress SARS-CoV-2/FLU/RSV  testing. Fact Sheet for Patients: https://www.moore.com/ Fact Sheet for Healthcare Providers: https://www.young.biz/ This test is not yet approved or cleared by the [place] FDA and  has been authorized for detection and/or diagnosis of SARS-CoV-2 by  FDA under an Emergency Use Authorization (EUA). This EUA will remain  in effect (meaning this test can be used) for the duration of the  Covid-19 declaration under Section 564(b)(1) of the Act, 21  U.S.C. section 360bbb-3(b)(1), unless the authorization is  terminated or revoked. Performed at [facility], [address] [place], [place] [address]    DG Ribs Unilateral W/Chest Right  Result Date: 07/18/2019 CLINICAL DATA:  Larey Seat, rib pain EXAM: RIGHT RIBS AND CHEST - 3+ VIEW COMPARISON:  02/28/2019 FINDINGS: Frontal and oblique views of the right thoracic cage are obtained. Cardiac silhouette is stable. No acute airspace disease, effusion, or pneumothorax. There are no acute displaced fractures. IMPRESSION: 1. No acute intrathoracic process. Electronically Signed   By: Sharlet Salina M.D.   On: 07/18/2019 09:04   CT Head Wo Contrast  Result Date: 07/18/2019 CLINICAL DATA:  Larey Seat, left knee fracture EXAM: CT HEAD WITHOUT CONTRAST TECHNIQUE: Contiguous axial images were obtained from the base of the skull through the vertex without intravenous contrast. COMPARISON:  02/27/2019 FINDINGS: Brain: No acute  infarct or hemorrhage. Lateral ventricles and midline structures are  unremarkable. No acute extra-axial fluid collections. No mass effect. Vascular: No hyperdense vessel or unexpected calcification. Skull: Normal. Negative for fracture or focal lesion. Sinuses/Orbits: No acute finding. Other: None IMPRESSION: 1. Stable head CT, no acute intrathoracic process. Electronically Signed   By: Sharlet Salina M.D.   On: 07/18/2019 09:09   DG Knee Complete 4 Views Left  Result Date: 07/18/2019 CLINICAL DATA:  Larey Seat, left knee pain, ecchymosis, swelling EXAM: LEFT KNEE - COMPLETE 4+ VIEW COMPARISON:  02/08/2017 FINDINGS: Frontal, bilateral oblique, and cross-table lateral views of the left knee are obtained. There is a an impacted mildly comminuted fracture involving the distal femoral metaphysis, with ventral angulation at the fracture site. There is approximately 1/2 shaft with dorsal displacement of the distal fracture fragment. Fracture extends into the patellofemoral compartment, with a small lipohemarthrosis identified. There is diffuse soft tissue edema surrounding the left knee. No other fractures. Alignment of the medial and lateral compartments is anatomic. IMPRESSION: 1. Impacted and angulated distal left femoral metaphyseal fracture, with small associated lipohemarthrosis. Electronically Signed   By: Sharlet Salina M.D.   On: 07/18/2019 09:03   DG HIP UNILAT W OR W/O PELVIS 2-3 VIEWS LEFT  Result Date: 07/18/2019 CLINICAL DATA:  Larey Seat, left hip pain EXAM: DG HIP (WITH OR WITHOUT PELVIS) 2-3V LEFT COMPARISON:  02/27/2019 FINDINGS: Frontal view of the pelvis as well as frontal and frogleg lateral views of the left hip are obtained. There are chronic fractures of the bilateral superior and inferior pubic rami. The bones are diffusely osteopenic. No acute displaced fracture. The hips are well aligned. Symmetrical hip osteoarthritis is stable. Continued spondylosis at the lumbosacral junction. IMPRESSION: 1.  Chronic bilateral superior and inferior pubic rami fractures. 2. No acute displaced fracture. 3. Severe osteopenia. Electronically Signed   By: Sharlet Salina M.D.   On: 07/18/2019 09:07    Pending Labs Unresulted Labs (From admission, onward)    Start     Ordered   07/19/19 0500  CBC  Tomorrow morning,   STAT     07/18/19 1359   07/19/19 0500  Basic metabolic panel  Tomorrow morning,   STAT     07/18/19 1359          Vitals/Pain Today's Vitals   07/18/19 0945 07/18/19 1000 07/18/19 1030 07/18/19 1100  BP:  124/60 112/71 125/62  Pulse: 92 91 90 91  Resp: 18 (!) 21 19 19   Temp:      TempSrc:      SpO2: 93% 92% 90% 92%  PainSc:        Isolation Precautions No active isolations  Medications Medications  traMADol (ULTRAM) tablet 50 mg (has no administration in time range)  metoprolol succinate (TOPROL-XL) 24 hr tablet 12.5 mg (has no administration in time range)  pantoprazole (PROTONIX) EC tablet 40 mg (has no administration in time range)  gabapentin (NEURONTIN) capsule 100 mg (has no administration in time range)  cholecalciferol (VITAMIN D3) tablet 400 Units (has no administration in time range)  morphine 2 MG/ML injection 0.5 mg (has no administration in time range)  0.9 %  sodium chloride infusion (has no administration in time range)  bisacodyl (DULCOLAX) suppository 10 mg (has no administration in time range)  morphine 2 MG/ML injection 2 mg (2 mg Intravenous Given 07/18/19 1004)  morphine 4 MG/ML injection 4 mg (4 mg Intravenous Given 07/18/19 1121)    Mobility non-ambulatory High fall risk   Focused Assessments    R Recommendations: See Admitting Provider Note  Report given to:

## 2019-07-18 NOTE — Consult Note (Signed)
Full consult note to follow. Plan a distal femur ORIF tomorrow afternoon. Will need COVID testing and medical clearance.

## 2019-07-18 NOTE — ED Notes (Signed)
Pt to xray

## 2019-07-18 NOTE — ED Triage Notes (Addendum)
Pt ems from [facility] s/p fall. Pt was getting up with walker, lost balance and fell. Pt c/o left knee, hip, rib pain. Left knee bruised and swollen. Pt denies dizziness or weakness before fall.

## 2019-07-18 NOTE — Consult Note (Signed)
CARDIOLOGY CONSULT NOTE               Patient ID: Christina Mooney MRN: [id number] DOB/AGE: [id number] 84 y.o.  Admit date: 07/18/2019 Referring Physician [facility] Primary Cardiologist Fath Reason for Consultation preoperative cardiovascular evaluation  HPI: 86 year old female referred for preoperative cardiovascular evaluation prior to potential surgical repair of left femur fracture. The patient is a resident of [facility] with a history of hypertension, hyperlipidemia, chronic diastolic CHF, carotid artery disease on Plavix, and recurrent falls. The patient sustained a fall yesterday when her leg supposedly gave way. X-ray revealed impacted and angulated distal left femoral metaphyseal fracture with associated small lipohemarthrosis. Admission labs notable for hemoglobin 9.7, hematocrit 31.0, WBC 14.5, high sensitivity troponin normal x 2. Labs this morning reveal hemoglobin 8.9 and hematocrit 28.7. ECG revealed sinus rhythm with inferior Q waves without acute ST-T wave abnormalities. 2D echocardiogram in 07/2016 revealed normal left ventricular function with LVEF greater than 55% with mild valvular insufficiencies.  Review of systems complete is difficult to obtain due to the patient's AMS   Past Medical History:  Diagnosis Date  . Chronic back pain   . Diverticulosis   . GERD (gastroesophageal reflux disease)   . Hyperlipidemia   . Hypertension   . Irritable bowel syndrome   . Lumbago   . Peripheral arterial disease ([place])   . Spinal stenosis of lumbar region with neurogenic claudication     Past Surgical History:  Procedure Laterality Date  . APPENDECTOMY    . BREAST BIOPSY Right 1980   neg    (Not in a hospital admission)  Social History   Socioeconomic History  . Marital status: Widowed    Spouse name: Not on file  . Number of children: Not on file  . Years of education: Not on file  . Highest education level: Not on file  Occupational  History  . Not on file  Tobacco Use  . Smoking status: Never Smoker  . Smokeless tobacco: Never Used  Substance and Sexual Activity  . Alcohol use: No  . Drug use: No  . Sexual activity: Never  Other Topics Concern  . Not on file  Social History Narrative  . Not on file   Social Determinants of Health   Financial Resource Strain:   . Difficulty of Paying Living Expenses: Not on file  Food Insecurity:   . Worried About Charity fundraiser in the Last Year: Not on file  . Ran Out of Food in the Last Year: Not on file  Transportation Needs:   . Lack of Transportation (Medical): Not on file  . Lack of Transportation (Non-Medical): Not on file  Physical Activity:   . Days of Exercise per Week: Not on file  . Minutes of Exercise per Session: Not on file  Stress:   . Feeling of Stress : Not on file  Social Connections:   . Frequency of Communication with Friends and Family: Not on file  . Frequency of Social Gatherings with Friends and Family: Not on file  . Attends Religious Services: Not on file  . Active Member of Clubs or Organizations: Not on file  . Attends Archivist Meetings: Not on file  . Marital Status: Not on file  Intimate Partner Violence:   . Fear of Current or Ex-Partner: Not on file  . Emotionally Abused: Not on file  . Physically Abused: Not on file  . Sexually Abused: Not on file  Family History  Problem Relation Age of Onset  . Congestive Heart Failure Mother   . Cerebrovascular Accident Father       Review of systems complete and found to be negative unless listed above      PHYSICAL EXAM  General: frail elderly female lying supine in bed in no acute distress HEENT:  Normocephalic and atramatic Neck:  No JVD.  Lungs: normal effort of breathing on room air without wheezing Heart: HRRR . Normal S1 and S2 without gallops or murmurs.  Abdomen: nondisteded Msk:  Gait not assessed, left left propped on pillow Extremities: No clubbing,  cyanosis, edema, or ecchymoses visible.   Neuro: Alert and disoriented Psych:  Good affect, appears confused  Labs:   Lab Results  Component Value Date   WBC 14.5 (H) 07/18/2019   HGB 9.7 (L) 07/18/2019   HCT 31.0 (L) 07/18/2019   MCV 75.6 (L) 07/18/2019   PLT 333 07/18/2019    Recent Labs  Lab 07/18/19 0922  NA 136  K 3.7  CL 101  CO2 23  BUN 19  CREATININE 0.55  CALCIUM 9.3  PROT 6.8  BILITOT 1.1  ALKPHOS 73  ALT 19  AST 18  GLUCOSE 123*   No results found for: CKTOTAL, CKMB, CKMBINDEX, TROPONINI No results found for: CHOL No results found for: HDL No results found for: LDLCALC No results found for: TRIG No results found for: CHOLHDL No results found for: LDLDIRECT    Radiology: DG Ribs Unilateral W/Chest Right  Result Date: 07/18/2019 CLINICAL DATA:  Larey Seat, rib pain EXAM: RIGHT RIBS AND CHEST - 3+ VIEW COMPARISON:  02/28/2019 FINDINGS: Frontal and oblique views of the right thoracic cage are obtained. Cardiac silhouette is stable. No acute airspace disease, effusion, or pneumothorax. There are no acute displaced fractures. IMPRESSION: 1. No acute intrathoracic process. Electronically Signed   By: Sharlet Salina M.D.   On: 07/18/2019 09:04   CT Head Wo Contrast  Result Date: 07/18/2019 CLINICAL DATA:  Larey Seat, left knee fracture EXAM: CT HEAD WITHOUT CONTRAST TECHNIQUE: Contiguous axial images were obtained from the base of the skull through the vertex without intravenous contrast. COMPARISON:  02/27/2019 FINDINGS: Brain: No acute infarct or hemorrhage. Lateral ventricles and midline structures are unremarkable. No acute extra-axial fluid collections. No mass effect. Vascular: No hyperdense vessel or unexpected calcification. Skull: Normal. Negative for fracture or focal lesion. Sinuses/Orbits: No acute finding. Other: None IMPRESSION: 1. Stable head CT, no acute intrathoracic process. Electronically Signed   By: Sharlet Salina M.D.   On: 07/18/2019 09:09   DG Knee Complete  4 Views Left  Result Date: 07/18/2019 CLINICAL DATA:  Larey Seat, left knee pain, ecchymosis, swelling EXAM: LEFT KNEE - COMPLETE 4+ VIEW COMPARISON:  02/08/2017 FINDINGS: Frontal, bilateral oblique, and cross-table lateral views of the left knee are obtained. There is a an impacted mildly comminuted fracture involving the distal femoral metaphysis, with ventral angulation at the fracture site. There is approximately 1/2 shaft with dorsal displacement of the distal fracture fragment. Fracture extends into the patellofemoral compartment, with a small lipohemarthrosis identified. There is diffuse soft tissue edema surrounding the left knee. No other fractures. Alignment of the medial and lateral compartments is anatomic. IMPRESSION: 1. Impacted and angulated distal left femoral metaphyseal fracture, with small associated lipohemarthrosis. Electronically Signed   By: Sharlet Salina M.D.   On: 07/18/2019 09:03   DG HIP UNILAT W OR W/O PELVIS 2-3 VIEWS LEFT  Result Date: 07/18/2019 CLINICAL DATA:  Larey Seat, left hip  pain EXAM: DG HIP (WITH OR WITHOUT PELVIS) 2-3V LEFT COMPARISON:  02/27/2019 FINDINGS: Frontal view of the pelvis as well as frontal and frogleg lateral views of the left hip are obtained. There are chronic fractures of the bilateral superior and inferior pubic rami. The bones are diffusely osteopenic. No acute displaced fracture. The hips are well aligned. Symmetrical hip osteoarthritis is stable. Continued spondylosis at the lumbosacral junction. IMPRESSION: 1. Chronic bilateral superior and inferior pubic rami fractures. 2. No acute displaced fracture. 3. Severe osteopenia. Electronically Signed   By: Sharlet Salina M.D.   On: 07/18/2019 09:07    EKG: sinus rhythm with inferior Q waves without evidence of ischemia  ASSESSMENT AND PLAN:  1. Distal left femur fracture, secondary to mechanical fall with history of recurrent falls. The patient is scheduled for ORIF today. The patient has been medically  optimized.  2. Anemia, with hemoglobin 8.9, hematocrit 28.7 3. Hypertension, blood pressure well controlled currently on metoprolol 4. Carotid arterty disease on Plavix; not on statin 5. Chronic diastolic CHF, appears euvolemic, no known recent history of shortness of breath, orthopnea, or peripheral edema.   Plan: 1. Monitor anemia closely  2. Continue to hold Plavix in anticipation of surgery and resume as soon as safely possible 3. Proceed with ORIF if deemed medically necessary and anemia is stabilized 4. Continue metoprolol perioperatively.  5. No further cardiac diagnostics recommended at this time  Signed: Leanora Ivanoff PA-C 07/18/2019, 4:51 PM Discussed with Dr. Darrold Junker who agrees with the above plan.

## 2019-07-18 NOTE — H&P (Signed)
History and Physical    Christina Mooney:[id number] DOB: May 15, 1925 DOA: 07/18/2019  PCP: Christina Crouch, MD   Patient coming from: [facility] (SNF)  I have personally briefly reviewed patient's old medical records in [place]  Chief Complaint: S/p Fall  HPI: Christina Mooney is a 86 y.o. female with medical history significant for GERD, chronic low back pain, hypertension, carotid artery disease who was brought into the emergency room for evaluation of a fall.  Patient has been in a skilled nursing facility following a fall with pelvic rim fractures. She ambulates with a rolling walker and states that she has continued to have falls while in the skilled nursing facility.  She fell again today despite having her rolling walker and has  complaints of right-sided rib pain, left knee and hip pain.  She denies hitting her head or having any loss of consciousness. She  denies feeling dizzy or lightheaded and did not trip on anything. She thinks her legs may have given way but is not sure. She had an x-ray which showed impacted and angulated distal left femoral metaphyseal fracture,with small associated lipohemarthrosis. Denies having any chest pain, nausea, vomiting, abdominal pain, changes in her bowel habits, cough, fever or chills.  ED Course: Patient in the emergency room following a fall and found to have a distal left femoral fracture.  Orthopedic surgery consult has been requested  Review of Systems: As per HPI otherwise 10 point review of systems negative.    Past Medical History:  Diagnosis Date  . Chronic back pain   . Diverticulosis   . GERD (gastroesophageal reflux disease)   . Hyperlipidemia   . Hypertension   . Irritable bowel syndrome   . Lumbago   . Peripheral arterial disease ([place])   . Spinal stenosis of lumbar region with neurogenic claudication     Past Surgical History:  Procedure Laterality Date  . APPENDECTOMY    . BREAST BIOPSY Right 1980   neg      reports that she has never smoked. She has never used smokeless tobacco. She reports that she does not drink alcohol or use drugs.  Allergies  Allergen Reactions  . Codeine Nausea And Vomiting  . Garlic Other (See Comments)    indigestion  . Milk-Related Compounds Other (See Comments)    Diarrhea/cramping  . Sorbitol Other (See Comments)    Diarrhea/cramping    Family History  Problem Relation Age of Onset  . Congestive Heart Failure Mother   . Cerebrovascular Accident Father      Prior to Admission medications   Medication Sig Start Date End Date Taking? Authorizing Provider  acetaminophen (TYLENOL) 500 MG tablet Take 1,000 mg by mouth 2 (two) times daily.   Yes [provider]  cholecalciferol (VITAMIN D3) 10 MCG (400 UNIT) TABS tablet Take 400 Units by mouth daily.    Yes [provider]  clopidogrel (PLAVIX) 75 MG tablet TAKE 1 TABLET BY MOUTH DAILY 05/02/19  Yes Schnier, Dolores Lory, MD  diazepam (VALIUM) 2 MG tablet Take 1 tablet (2 mg total) by mouth every 8 (eight) hours as needed (dizziness). 03/02/19  Yes Sudini, Alveta Heimlich, MD  esomeprazole (NEXIUM) 40 MG capsule Take 40 mg by mouth daily.    Yes [provider]  gabapentin (NEURONTIN) 100 MG capsule Take 100 mg by mouth at bedtime.   Yes [provider]  metoprolol succinate (TOPROL-XL) 25 MG 24 hr tablet Take 12.5 mg by mouth daily. 02/22/17  Yes [provider]  traMADol (ULTRAM) 50 MG tablet Take 1 tablet (50 mg total) by mouth every 6 (six) hours as needed for moderate pain. 03/02/19  Yes Milagros Loll, MD    Physical Exam: Vitals:   07/18/19 0831 07/18/19 0930 07/18/19 0945 07/18/19 1000  BP: 138/64 131/72  124/60  Pulse: 90 92 92 91  Resp:  18 18 (!) 21  Temp: 97.9 F (36.6 C)     TempSrc: Oral     SpO2: 92% 91% 93% 92%     Vitals:   07/18/19 0831 07/18/19 0930 07/18/19 0945 07/18/19 1000  BP: 138/64 131/72  124/60  Pulse: 90 92 92 91  Resp:  18 18 (!) 21   Temp: 97.9 F (36.6 C)     TempSrc: Oral     SpO2: 92% 91% 93% 92%    Constitutional: NAD, alert and oriented to person, place and time.  Frail appearing Eyes: PERRL, lids and conjunctivae normal ENMT: Mucous membranes are moist.  Neck: normal, supple, no masses, no thyromegaly Respiratory: clear to auscultation bilaterally, no wheezing, no crackles. Normal respiratory effort. No accessory muscle use.  Cardiovascular: Regular rate and rhythm, no murmurs / rubs / gallops. No extremity edema. 2+ pedal pulses. No carotid bruits.  Abdomen: no tenderness, no masses palpated. No hepatosplenomegaly. Bowel sounds positive.  Musculoskeletal: no clubbing / cyanosis. No joint deformity upper and lower extremities.  Shortening of the left lower extremity Skin: no rashes, lesions, ulcers.  Ecchymosis involving both forearms Neurologic: No gross focal neurologic deficit. Psychiatric: Normal mood and affect.   Labs on Admission: I have personally reviewed following labs and imaging studies  CBC: Recent Labs  Lab 07/18/19 0922  WBC 14.5*  NEUTROABS 13.6*  HGB 9.7*  HCT 31.0*  MCV 75.6*  PLT 333   Basic Metabolic Panel: Recent Labs  Lab 07/18/19 0922  NA 136  K 3.7  CL 101  CO2 23  GLUCOSE 123*  BUN 19  CREATININE 0.55  CALCIUM 9.3   GFR: CrCl cannot be calculated (Unknown ideal weight.). Liver Function Tests: Recent Labs  Lab 07/18/19 0922  AST 18  ALT 19  ALKPHOS 73  BILITOT 1.1  PROT 6.8  ALBUMIN 3.8   No results for input(s): LIPASE, AMYLASE in the last 168 hours. No results for input(s): AMMONIA in the last 168 hours. Coagulation Profile: No results for input(s): INR, PROTIME in the last 168 hours. Cardiac Enzymes: No results for input(s): CKTOTAL, CKMB, CKMBINDEX, TROPONINI in the last 168 hours. BNP (last 3 results) No results for input(s): PROBNP in the last 8760 hours. HbA1C: No results for input(s): HGBA1C in the last 72 hours. CBG: No results for  input(s): GLUCAP in the last 168 hours. Lipid Profile: No results for input(s): CHOL, HDL, LDLCALC, TRIG, CHOLHDL, LDLDIRECT in the last 72 hours. Thyroid Function Tests: No results for input(s): TSH, T4TOTAL, FREET4, T3FREE, THYROIDAB in the last 72 hours. Anemia Panel: No results for input(s): VITAMINB12, FOLATE, FERRITIN, TIBC, IRON, RETICCTPCT in the last 72 hours. Urine analysis: No results found for: COLORURINE, APPEARANCEUR, LABSPEC, PHURINE, GLUCOSEU, HGBUR, BILIRUBINUR, KETONESUR, PROTEINUR, UROBILINOGEN, NITRITE, LEUKOCYTESUR  Radiological Exams on Admission: DG Ribs Unilateral W/Chest Right  Result Date: 07/18/2019 CLINICAL DATA:  Larey Seat, rib pain EXAM: RIGHT RIBS AND CHEST - 3+ VIEW COMPARISON:  02/28/2019 FINDINGS: Frontal and oblique views of the right thoracic cage are obtained. Cardiac silhouette is stable. No acute airspace disease, effusion, or pneumothorax. There are no acute displaced fractures. IMPRESSION:  1. No acute intrathoracic process. Electronically Signed   By: Sharlet Salina M.D.   On: 07/18/2019 09:04   CT Head Wo Contrast  Result Date: 07/18/2019 CLINICAL DATA:  Larey Seat, left knee fracture EXAM: CT HEAD WITHOUT CONTRAST TECHNIQUE: Contiguous axial images were obtained from the base of the skull through the vertex without intravenous contrast. COMPARISON:  02/27/2019 FINDINGS: Brain: No acute infarct or hemorrhage. Lateral ventricles and midline structures are unremarkable. No acute extra-axial fluid collections. No mass effect. Vascular: No hyperdense vessel or unexpected calcification. Skull: Normal. Negative for fracture or focal lesion. Sinuses/Orbits: No acute finding. Other: None IMPRESSION: 1. Stable head CT, no acute intrathoracic process. Electronically Signed   By: Sharlet Salina M.D.   On: 07/18/2019 09:09   DG Knee Complete 4 Views Left  Result Date: 07/18/2019 CLINICAL DATA:  Larey Seat, left knee pain, ecchymosis, swelling EXAM: LEFT KNEE - COMPLETE 4+ VIEW  COMPARISON:  02/08/2017 FINDINGS: Frontal, bilateral oblique, and cross-table lateral views of the left knee are obtained. There is a an impacted mildly comminuted fracture involving the distal femoral metaphysis, with ventral angulation at the fracture site. There is approximately 1/2 shaft with dorsal displacement of the distal fracture fragment. Fracture extends into the patellofemoral compartment, with a small lipohemarthrosis identified. There is diffuse soft tissue edema surrounding the left knee. No other fractures. Alignment of the medial and lateral compartments is anatomic. IMPRESSION: 1. Impacted and angulated distal left femoral metaphyseal fracture, with small associated lipohemarthrosis. Electronically Signed   By: Sharlet Salina M.D.   On: 07/18/2019 09:03   DG HIP UNILAT W OR W/O PELVIS 2-3 VIEWS LEFT  Result Date: 07/18/2019 CLINICAL DATA:  Larey Seat, left hip pain EXAM: DG HIP (WITH OR WITHOUT PELVIS) 2-3V LEFT COMPARISON:  02/27/2019 FINDINGS: Frontal view of the pelvis as well as frontal and frogleg lateral views of the left hip are obtained. There are chronic fractures of the bilateral superior and inferior pubic rami. The bones are diffusely osteopenic. No acute displaced fracture. The hips are well aligned. Symmetrical hip osteoarthritis is stable. Continued spondylosis at the lumbosacral junction. IMPRESSION: 1. Chronic bilateral superior and inferior pubic rami fractures. 2. No acute displaced fracture. 3. Severe osteopenia. Electronically Signed   By: Sharlet Salina M.D.   On: 07/18/2019 09:07    EKG: Independently reviewed. Sinus rhythm  Assessment/Plan Principal Problem:   Femur fracture, left (HCC) Active Problems:   GERD (gastroesophageal reflux disease)   Carotid artery stenosis   Essential hypertension, benign   Fall     Status post fall with left femur fracture Immobilize left lower extremity Consult orthopedics Pain control   Carotid artery stenosis Patient on  Plavix which will be held for possible surgical repair left femur fracture Continue metoprolol   Hypertension Blood pressure stable   GERD Continue Protonix    DVT prophylaxis: SCD Code Status: Full Family Communication: Plan of care has been discussed with patient in detail.  Verbalizes understanding and agrees with the plan Disposition Plan: Back to previous home environment Consults called: Orthopedics    Deeann Servidio MD [facility]     07/18/2019, 10:57 AM

## 2019-07-19 ENCOUNTER — Inpatient Hospital Stay: Payer: Medicare PPO

## 2019-07-19 ENCOUNTER — Encounter: Payer: Self-pay | Admitting: Internal Medicine

## 2019-07-19 ENCOUNTER — Inpatient Hospital Stay: Payer: Medicare PPO | Admitting: Anesthesiology

## 2019-07-19 ENCOUNTER — Encounter
Admission: EM | Disposition: A | Discharge status: Skilled Nursing Facility | Payer: Self-pay | Source: Skilled Nursing Facility | Attending: Internal Medicine

## 2019-07-19 DIAGNOSIS — I6523 Occlusion and stenosis of bilateral carotid arteries: Secondary | ICD-10-CM

## 2019-07-19 DIAGNOSIS — S7292XA Unspecified fracture of left femur, initial encounter for closed fracture: Secondary | ICD-10-CM

## 2019-07-19 DIAGNOSIS — I10 Essential (primary) hypertension: Secondary | ICD-10-CM

## 2019-07-19 DIAGNOSIS — W19XXXA Unspecified fall, initial encounter: Secondary | ICD-10-CM

## 2019-07-19 DIAGNOSIS — E43 Unspecified severe protein-calorie malnutrition: Secondary | ICD-10-CM | POA: Insufficient documentation

## 2019-07-19 HISTORY — PX: ORIF FEMUR FRACTURE: SHX2119

## 2019-07-19 LAB — CBC
HCT: 28.7 % — ABNORMAL LOW (ref 36.0–46.0)
Hemoglobin: 8.9 g/dL — ABNORMAL LOW (ref 12.0–15.0)
MCH: 24 pg — ABNORMAL LOW (ref 26.0–34.0)
MCHC: 31 g/dL (ref 30.0–36.0)
MCV: 77.4 fL — ABNORMAL LOW (ref 80.0–100.0)
Platelets: 264 10*3/uL (ref 150–400)
RBC: 3.71 MIL/uL — ABNORMAL LOW (ref 3.87–5.11)
RDW: 16.3 % — ABNORMAL HIGH (ref 11.5–15.5)
WBC: 12.7 10*3/uL — ABNORMAL HIGH (ref 4.0–10.5)
nRBC: 0 % (ref 0.0–0.2)

## 2019-07-19 LAB — BASIC METABOLIC PANEL
Anion gap: 11 (ref 5–15)
BUN: 20 mg/dL (ref 8–23)
CO2: 22 mmol/L (ref 22–32)
Calcium: 8.7 mg/dL — ABNORMAL LOW (ref 8.9–10.3)
Chloride: 103 mmol/L (ref 98–111)
Creatinine, Ser: 0.55 mg/dL (ref 0.44–1.00)
GFR calc Af Amer: >60 mL/min (ref >60)
GFR calc non Af Amer: >60 mL/min (ref >60)
Glucose, Bld: 96 mg/dL (ref 70–99)
Potassium: 3.7 mmol/L (ref 3.5–5.1)
Sodium: 136 mmol/L (ref 135–145)

## 2019-07-19 SURGERY — OPEN REDUCTION INTERNAL FIXATION (ORIF) DISTAL FEMUR FRACTURE
Anesthesia: General | Site: Leg Lower | Laterality: Left

## 2019-07-19 MED ORDER — ONDANSETRON HCL 4 MG PO TABS: 4.0000 mg | ORAL_TABLET | Freq: Four times a day (QID) | ORAL | Status: DC | PRN

## 2019-07-19 MED ORDER — SEVOFLURANE IN SOLN
RESPIRATORY_TRACT | Status: AC
  Filled 2019-07-19: qty 250

## 2019-07-19 MED ORDER — SUCCINYLCHOLINE CHLORIDE 20 MG/ML IJ SOLN
INTRAMUSCULAR | Status: DC | PRN
  Administered 2019-07-19: 100 mg via INTRAVENOUS

## 2019-07-19 MED ORDER — BUPIVACAINE-EPINEPHRINE (PF) 0.25% -1:200000 IJ SOLN
INTRAMUSCULAR | Status: DC | PRN
  Administered 2019-07-19: 20 mL via PERINEURAL

## 2019-07-19 MED ORDER — OXYCODONE HCL 5 MG PO TABS: 5.0000 mg | ORAL_TABLET | Freq: Once | ORAL | Status: DC | PRN

## 2019-07-19 MED ORDER — LIDOCAINE HCL (PF) 2 % IJ SOLN
INTRAMUSCULAR | Status: AC
  Filled 2019-07-19: qty 10

## 2019-07-19 MED ORDER — ACETAMINOPHEN 10 MG/ML IV SOLN
INTRAVENOUS | Status: AC
  Filled 2019-07-19: qty 100

## 2019-07-19 MED ORDER — ACETAMINOPHEN 10 MG/ML IV SOLN
INTRAVENOUS | Status: DC | PRN
  Administered 2019-07-19: 1000 mg via INTRAVENOUS

## 2019-07-19 MED ORDER — BACITRACIN 50000 UNITS IM SOLR
INTRAMUSCULAR | Status: AC
  Filled 2019-07-19: qty 1

## 2019-07-19 MED ORDER — SODIUM CHLORIDE 0.9 % IR SOLN
Status: DC | PRN
  Administered 2019-07-19: 17:00:00 250 mL

## 2019-07-19 MED ORDER — CEFAZOLIN SODIUM-DEXTROSE 2-4 GM/100ML-% IV SOLN
INTRAVENOUS | Status: AC
  Filled 2019-07-19: qty 100

## 2019-07-19 MED ORDER — OXYCODONE HCL 5 MG/5ML PO SOLN: 5.0000 mg | Freq: Once | ORAL | Status: DC | PRN

## 2019-07-19 MED ORDER — ROCURONIUM BROMIDE 100 MG/10ML IV SOLN
INTRAVENOUS | Status: DC | PRN
  Administered 2019-07-19: 5 mg via INTRAVENOUS
  Administered 2019-07-19: 20 mg via INTRAVENOUS

## 2019-07-19 MED ORDER — DEXAMETHASONE SODIUM PHOSPHATE 10 MG/ML IJ SOLN
INTRAMUSCULAR | Status: DC | PRN
  Administered 2019-07-19: 4 mg via INTRAVENOUS

## 2019-07-19 MED ORDER — ONDANSETRON HCL 4 MG/2ML IJ SOLN
INTRAMUSCULAR | Status: AC
  Filled 2019-07-19: qty 2

## 2019-07-19 MED ORDER — ENSURE ENLIVE PO LIQD
237.0000 mL | Freq: Two times a day (BID) | ORAL | Status: DC
  Administered 2019-07-20 – 2019-07-21 (×3): 237 mL via ORAL

## 2019-07-19 MED ORDER — FENTANYL CITRATE (PF) 100 MCG/2ML IJ SOLN
INTRAMUSCULAR | Status: AC
  Filled 2019-07-19: qty 2

## 2019-07-19 MED ORDER — SUGAMMADEX SODIUM 200 MG/2ML IV SOLN
INTRAVENOUS | Status: DC | PRN
  Administered 2019-07-19: 200 mg via INTRAVENOUS

## 2019-07-19 MED ORDER — BUPIVACAINE HCL (PF) 0.25 % IJ SOLN
INTRAMUSCULAR | Status: AC
  Filled 2019-07-19: qty 30

## 2019-07-19 MED ORDER — CEFAZOLIN SODIUM-DEXTROSE 1-4 GM/50ML-% IV SOLN
1.0000 g | Freq: Four times a day (QID) | INTRAVENOUS | Status: DC
  Administered 2019-07-19 – 2019-07-20 (×2): 1 g via INTRAVENOUS
  Filled 2019-07-19 (×4): qty 50

## 2019-07-19 MED ORDER — METOCLOPRAMIDE HCL 10 MG PO TABS: 5.0000 mg | ORAL_TABLET | Freq: Three times a day (TID) | ORAL | Status: DC | PRN

## 2019-07-19 MED ORDER — LACTATED RINGERS IV SOLN: INTRAVENOUS | Status: DC

## 2019-07-19 MED ORDER — FENTANYL CITRATE (PF) 100 MCG/2ML IJ SOLN: 25.0000 ug | INTRAMUSCULAR | Status: DC | PRN

## 2019-07-19 MED ORDER — PROPOFOL 10 MG/ML IV BOLUS
INTRAVENOUS | Status: DC | PRN
  Administered 2019-07-19: 70 mg via INTRAVENOUS

## 2019-07-19 MED ORDER — ACETAMINOPHEN 10 MG/ML IV SOLN: 500.0000 mg | Freq: Once | INTRAVENOUS | Status: DC | PRN

## 2019-07-19 MED ORDER — DEXAMETHASONE SODIUM PHOSPHATE 10 MG/ML IJ SOLN
INTRAMUSCULAR | Status: AC
  Filled 2019-07-19: qty 1

## 2019-07-19 MED ORDER — PHENYLEPHRINE HCL (PRESSORS) 10 MG/ML IV SOLN
INTRAVENOUS | Status: DC | PRN
  Administered 2019-07-19: 100 ug via INTRAVENOUS
  Administered 2019-07-19 (×2): 200 ug via INTRAVENOUS
  Administered 2019-07-19: 100 ug via INTRAVENOUS
  Administered 2019-07-19 (×2): 200 ug via INTRAVENOUS

## 2019-07-19 MED ORDER — DOCUSATE SODIUM 100 MG PO CAPS
100.0000 mg | ORAL_CAPSULE | Freq: Two times a day (BID) | ORAL | Status: DC
  Administered 2019-07-19 – 2019-07-21 (×4): 100 mg via ORAL
  Filled 2019-07-19 (×5): qty 1

## 2019-07-19 MED ORDER — ONDANSETRON HCL 4 MG/2ML IJ SOLN
INTRAMUSCULAR | Status: DC | PRN
  Administered 2019-07-19: 4 mg via INTRAVENOUS

## 2019-07-19 MED ORDER — TRANEXAMIC ACID-NACL 1000-0.7 MG/100ML-% IV SOLN
1000.0000 mg | INTRAVENOUS | Status: DC
  Filled 2019-07-19: qty 100

## 2019-07-19 MED ORDER — ADULT MULTIVITAMIN W/MINERALS CH
1.0000 | ORAL_TABLET | Freq: Every day | ORAL | Status: DC
  Administered 2019-07-20 – 2019-07-21 (×2): 1 via ORAL
  Filled 2019-07-19 (×2): qty 1

## 2019-07-19 MED ORDER — LIDOCAINE HCL (CARDIAC) PF 100 MG/5ML IV SOSY
PREFILLED_SYRINGE | INTRAVENOUS | Status: DC | PRN
  Administered 2019-07-19: 60 mg via INTRAVENOUS

## 2019-07-19 MED ORDER — SODIUM CHLORIDE FLUSH 0.9 % IV SOLN
INTRAVENOUS | Status: AC
  Filled 2019-07-19: qty 10

## 2019-07-19 MED ORDER — CEFAZOLIN SODIUM-DEXTROSE 2-4 GM/100ML-% IV SOLN
2.0000 g | INTRAVENOUS | Status: AC
  Administered 2019-07-19: 2 g via INTRAVENOUS
  Filled 2019-07-19: qty 100

## 2019-07-19 MED ORDER — PROPOFOL 10 MG/ML IV BOLUS
INTRAVENOUS | Status: AC
  Filled 2019-07-19: qty 20

## 2019-07-19 MED ORDER — ONDANSETRON HCL 4 MG/2ML IJ SOLN: 4.0000 mg | Freq: Four times a day (QID) | INTRAMUSCULAR | Status: DC | PRN

## 2019-07-19 MED ORDER — ONDANSETRON HCL 4 MG/2ML IJ SOLN: 4.0000 mg | Freq: Once | INTRAMUSCULAR | Status: DC | PRN

## 2019-07-19 MED ORDER — METOCLOPRAMIDE HCL 5 MG/ML IJ SOLN: 5.0000 mg | Freq: Three times a day (TID) | INTRAMUSCULAR | Status: DC | PRN

## 2019-07-19 MED ORDER — FENTANYL CITRATE (PF) 100 MCG/2ML IJ SOLN
INTRAMUSCULAR | Status: DC | PRN
  Administered 2019-07-19: 25 ug via INTRAVENOUS

## 2019-07-19 MED ORDER — LACTATED RINGERS IV SOLN: INTRAVENOUS | Status: DC | PRN

## 2019-07-19 SURGICAL SUPPLY — 43 items
BIT DRILL GUIDEWIRE 2.5X200 (WIRE) ×4 IMPLANT
BNDG COHESIVE 4X5 TAN STRL (GAUZE/BANDAGES/DRESSINGS) ×2 IMPLANT
BNDG COHESIVE 6X5 TAN STRL LF (GAUZE/BANDAGES/DRESSINGS) ×2 IMPLANT
BNDG ELASTIC 6X5.8 VLCR STR LF (GAUZE/BANDAGES/DRESSINGS) ×2 IMPLANT
CANISTER SUCT 1200ML W/VALVE (MISCELLANEOUS) ×6 IMPLANT
CHLORAPREP W/TINT 26 (MISCELLANEOUS) ×4 IMPLANT
COVER WAND RF STERILE (DRAPES) ×3 IMPLANT
DRAPE C-ARM XRAY 36X54 (DRAPES) ×3 IMPLANT
DRSG AQUACEL AG ADV 3.5X10 (GAUZE/BANDAGES/DRESSINGS) ×2 IMPLANT
ELECT REM PT RETURN 9FT ADLT (ELECTROSURGICAL) ×3
ELECTRODE REM PT RTRN 9FT ADLT (ELECTROSURGICAL) ×1 IMPLANT
GAUZE SPONGE 4X4 12PLY STRL (GAUZE/BANDAGES/DRESSINGS) ×3 IMPLANT
GAUZE XEROFORM 1X8 LF (GAUZE/BANDAGES/DRESSINGS) ×3 IMPLANT
GLOVE BIO SURGEON STRL SZ8 (GLOVE) ×2 IMPLANT
GLOVE BIOGEL PI IND STRL 8.5 (GLOVE) IMPLANT
GLOVE BIOGEL PI INDICATOR 8.5 (GLOVE) ×2
GLOVE INDICATOR 8.0 STRL GRN (GLOVE) ×3 IMPLANT
GLOVE SURG ORTHO 8.0 STRL STRW (GLOVE) ×6 IMPLANT
GOWN STRL REUS W/ TWL LRG LVL3 (GOWN DISPOSABLE) ×2 IMPLANT
GOWN STRL REUS W/TWL LRG LVL3 (GOWN DISPOSABLE) ×4
IMMOB KNEE 14 THIGH 24 706614 (SOFTGOODS) ×2 IMPLANT
KIT TURNOVER KIT A (KITS) ×3 IMPLANT
MAT ABSORB  FLUID 56X50 GRAY (MISCELLANEOUS) ×2
MAT ABSORB FLUID 56X50 GRAY (MISCELLANEOUS) ×1 IMPLANT
NDL SPNL 20GX3.5 QUINCKE YW (NEEDLE) IMPLANT
NEEDLE SPNL 20GX3.5 QUINCKE YW (NEEDLE) ×3 IMPLANT
NS IRRIG 1000ML POUR BTL (IV SOLUTION) ×3 IMPLANT
PACK ANTERIOR HIP CUSTOM (KITS) ×3 IMPLANT
PLATE LT 6HOLE (Plate) ×2 IMPLANT
SCREW CORTEX ST 4.5X30 (Screw) ×2 IMPLANT
SCREW CORTEX ST 4.5X32 (Screw) ×4 IMPLANT
SCREW CORTEX ST 4.5X34 (Screw) ×2 IMPLANT
SCREW CORTEX ST 4.5X36 (Screw) ×2 IMPLANT
SCREW LOCKING VA 5.0X60MM (Screw) ×2 IMPLANT
SCREW VA CANNUL LOCK 5.0X55MM (Screw) ×2 IMPLANT
SCREW VA LOCKING 5.0X55 (Screw) ×4 IMPLANT
SCREW VA LOCKING 5.0X65 (Screw) ×2 IMPLANT
SUT DVC 2 QUILL PDO  T11 36X36 (SUTURE) ×2
SUT DVC 2 QUILL PDO T11 36X36 (SUTURE) ×1 IMPLANT
SUT VIC AB 1 CT1 18XCR BRD 8 (SUTURE) ×1 IMPLANT
SUT VIC AB 1 CT1 8-18 (SUTURE) ×2
SUT VIC AB 2-0 CT1 18 (SUTURE) ×3 IMPLANT
SYR 20ML LL LF (SYRINGE) ×2 IMPLANT

## 2019-07-19 NOTE — Anesthesia Procedure Notes (Signed)
Procedure Name: Intubation Date/Time: 07/19/2019 3:40 PM Performed by: Allean Found, CRNA Pre-anesthesia Checklist: Patient identified, Patient being monitored, Timeout performed, Emergency Drugs available and Suction available Patient Re-evaluated:Patient Re-evaluated prior to induction Oxygen Delivery Method: Circle system utilized Preoxygenation: Pre-oxygenation with 100% oxygen Induction Type: IV induction Ventilation: Mask ventilation without difficulty Laryngoscope Size: Mac and 3 Grade View: Grade I Tube type: Oral Tube size: 6.5 mm Number of attempts: 1 Airway Equipment and Method: Stylet Placement Confirmation: ETT inserted through vocal cords under direct vision,  positive ETCO2 and breath sounds checked- equal and bilateral Secured at: 21 cm Tube secured with: Tape Dental Injury: Teeth and Oropharynx as per pre-operative assessment

## 2019-07-19 NOTE — Transfer of Care (Signed)
Immediate Anesthesia Transfer of Care Note  Patient: Christina Mooney  Procedure(s) Performed: OPEN REDUCTION INTERNAL FIXATION (ORIF) DISTAL FEMUR FRACTURE (Left Leg Lower)  Patient Location: PACU  Anesthesia Type:General  Level of Consciousness: awake and alert   Airway & Oxygen Therapy: Patient Spontanous Breathing and Patient connected to face mask oxygen  Post-op Assessment: Report given to RN and Post -op Vital signs reviewed and stable  Post vital signs: Reviewed and stable  Last Vitals:  Vitals Value Taken Time  BP 138/96 07/19/19 1719  Temp    Pulse 84 07/19/19 1720  Resp 17 07/19/19 1720  SpO2 100 % 07/19/19 1720  Vitals shown include unvalidated device data.  Last Pain:  Vitals:   07/19/19 1342  TempSrc: Temporal  PainSc: 0-No pain         Complications: No apparent anesthesia complications

## 2019-07-19 NOTE — Progress Notes (Signed)
D: Pt alert and oriented.Pt is forgetful at times however redirects well. Pt denies experiencing any pain at this time.   A: Scheduled medications administered to pt, per MD orders. Support and encouragement provided. Frequent verbal contact made.    R: No adverse drug reactions noted. Pt complaint with medications and treatment plan. Pt interacts well with staff on the unit. Pt is stable at this time, Will continue to monitor and provide care for as ordered.  Pt's son's are pt's healthcare proxy  Christina Mooney [phone] and Christina Mooney [phone]  Christina Mooney was at pt's bedside once they returned from surgery.

## 2019-07-19 NOTE — Anesthesia Preprocedure Evaluation (Signed)
Anesthesia Evaluation  Patient identified by MRN, date of birth, ID band Patient awake    Reviewed: Allergy & Precautions, NPO status , Patient's Chart, lab work & pertinent test results  History of Anesthesia Complications Negative for: history of anesthetic complications  Airway Mallampati: II  TM Distance: >3 FB Neck ROM: Full    Dental no notable dental hx. (+) Teeth Intact, Dental Advisory Given   Pulmonary neg pulmonary ROS, neg sleep apnea, neg COPD, Patient abstained from smoking.Not current smoker,    Pulmonary exam normal breath sounds clear to auscultation       Cardiovascular Exercise Tolerance: Good METShypertension, + Peripheral Vascular Disease  (-) CAD and (-) Past MI negative cardio ROS  (-) dysrhythmias  Rhythm:Regular Rate:Normal - Systolic murmurs Carotid stenosis (40-59% bilaterally ) on plavix  Per cardiology note, "2D echocardiogram in 07/2016 revealed normal left ventricular function with LVEF greater than 55% with mild valvular insufficiencies"   Neuro/Psych negative neurological ROS  negative psych ROS   GI/Hepatic GERD  Controlled,(+)     (-) substance abuse  ,   Endo/Other  neg diabetes  Renal/GU negative Renal ROS     Musculoskeletal   Abdominal   Peds  Hematology   Anesthesia Other Findings Past Medical History: No date: Chronic back pain No date: Diverticulosis No date: GERD (gastroesophageal reflux disease) No date: Hyperlipidemia No date: Hypertension No date: Irritable bowel syndrome No date: Lumbago No date: Peripheral arterial disease (HCC) No date: Spinal stenosis of lumbar region with neurogenic claudication  Reproductive/Obstetrics                             Anesthesia Physical Anesthesia Plan  ASA: III  Anesthesia Plan: General   Post-op Pain Management:    Induction: Intravenous  PONV Risk Score and Plan: 4 or greater and  Ondansetron and Dexamethasone  Airway Management Planned: Oral ETT  Additional Equipment: None  Intra-op Plan:   Post-operative Plan: Extubation in OR  Informed Consent: I have reviewed the patients History and Physical, chart, labs and discussed the procedure including the risks, benefits and alternatives for the proposed anesthesia with the patient or authorized representative who has indicated his/her understanding and acceptance.     Dental advisory given  Plan Discussed with: CRNA and Surgeon  Anesthesia Plan Comments: (Discussed risks of anesthesia with patient and son, including PONV, sore throat, lip/dental damage, post op cognitive dysfunction. Rare risks discussed as well, such as cardiorespiratory and neurological sequelae. Patient understands, although she is AOx2. Spoke to her Nickola Major on phone to obtain consent.  Will not do spinal because patient has taken Plavix recently (likely yesterday at nursing home).)        Anesthesia Quick Evaluation

## 2019-07-19 NOTE — NC FL2 (Signed)
[place] LEVEL OF CARE SCREENING TOOL     IDENTIFICATION  Patient Name: Christina Mooney Birthdate: 1926-02-14 Sex: female Admission Date (Current Location): 07/18/2019  [place] and [place] Number:  Engineering geologist and Address:  [facility], [address], [place], [place] [address]      Provider Number: [id number]  Attending Physician Name and Address:  Fritzi Mandes, MD  Relative Name and Phone Number:  Cherylanne Ardelean [phone]    Current Level of Care: Hospital Recommended Level of Care: [place] Prior Approval Number:    Date Approved/Denied:   PASRR Number: [id number]  Discharge Plan: SNF    Current Diagnoses: Patient Active Problem List   Diagnosis Date Noted  . Femur fracture, left ([place]) 07/18/2019  . Closed fracture of left distal femur ([place]) 07/18/2019  . Pneumothorax on left   . Pneumothorax, traumatic   . Fall 02/27/2019  . GERD (gastroesophageal reflux disease) 12/28/2015  . Carotid artery stenosis 12/28/2015  . Aortic atherosclerosis ([place]) 12/28/2015  . Hyperlipidemia 12/28/2015  . Essential hypertension, benign 12/28/2015  . Fibromuscular dysplasia ([place]) 12/28/2015    Orientation RESPIRATION BLADDER Height & Weight     Self, Place    External catheter Weight: 37.2 kg Height:  5' (152.4 cm)  BEHAVIORAL SYMPTOMS/MOOD NEUROLOGICAL BOWEL NUTRITION STATUS      Continent Diet(Regular)  AMBULATORY STATUS COMMUNICATION OF NEEDS Skin   Extensive Assist Verbally Surgical wounds, Bruising                       Personal Care Assistance Level of Assistance  Dressing     Dressing Assistance: Limited assistance     Functional Limitations Info             SPECIAL CARE FACTORS FREQUENCY  PT (By licensed PT), OT (By licensed OT)     PT Frequency: 5 times per week OT Frequency: 5 times per week            Contractures Contractures Info: Not present    Additional Factors  Info  Code Status, Allergies Code Status Info: full code Allergies Info: Codeine, Garlic, Milk-related Compounds, Sorbitol           Current Medications (07/19/2019):  This is the current hospital active medication list Current Facility-Administered Medications  Medication Dose Route Frequency Provider Last Rate Last Admin  . 0.9 %  sodium chloride infusion   Intravenous Continuous Agbata, Tochukwu, MD 50 mL/hr at 07/19/19 0400 Rate Verify at 07/19/19 0400  . bisacodyl (DULCOLAX) suppository 10 mg  10 mg Rectal Daily PRN Agbata, Tochukwu, MD      . ceFAZolin (ANCEF) IVPB 2g/100 mL premix  2 g Intravenous 30 min Pre-Op Lovell Sheehan, MD      . cholecalciferol (VITAMIN D3) tablet 400 Units  400 Units Oral Daily Agbata, Tochukwu, MD      . gabapentin (NEURONTIN) capsule 100 mg  100 mg Oral QHS Agbata, Tochukwu, MD   100 mg at 07/18/19 2145  . metoprolol succinate (TOPROL-XL) 24 hr tablet 12.5 mg  12.5 mg Oral Daily Agbata, Tochukwu, MD      . morphine 2 MG/ML injection 0.5 mg  0.5 mg Intravenous Q2H PRN Agbata, Tochukwu, MD   0.5 mg at 07/19/19 0611  . pantoprazole (PROTONIX) EC tablet 40 mg  40 mg Oral Daily Agbata, Tochukwu, MD      . traMADol (ULTRAM) tablet 50 mg  50 mg Oral Q6H  PRN Agbata, Tochukwu, MD      . tranexamic acid (CYKLOKAPRON) IVPB 1,000 mg  1,000 mg Intravenous To OR Lyndle Herrlich, MD         Discharge Medications: Please see discharge summary for a list of discharge medications.  Relevant Imaging Results:  Relevant Lab Results:   Additional Information SS# [id number]  [name], RN

## 2019-07-19 NOTE — Progress Notes (Signed)
Christina Mooney at [place] NAME: Christina Mooney    MR#:  [id number]  DATE OF BIRTH:  March 22, 1926  SUBJECTIVE:   Patient seen in PACU. Complains of left hip pain is from Christina Mooney after mechanical fall. REVIEW OF SYSTEMS:   Review of Systems  Constitutional: Negative for chills, fever and weight loss.  HENT: Negative for ear discharge, ear pain and nosebleeds.   Eyes: Negative for blurred vision, pain and discharge.  Respiratory: Negative for sputum production, shortness of breath, wheezing and stridor.   Cardiovascular: Negative for chest pain, palpitations, orthopnea and PND.  Gastrointestinal: Negative for abdominal pain, diarrhea, nausea and vomiting.  Genitourinary: Negative for frequency and urgency.  Musculoskeletal: Positive for back pain and joint pain.  Neurological: Positive for weakness. Negative for sensory change, speech change and focal weakness.  Psychiatric/Behavioral: Negative for depression and hallucinations. The patient is not nervous/anxious.    Tolerating Diet: NPO Tolerating PT: ending  DRUG ALLERGIES:   Allergies  Allergen Reactions  . Codeine Nausea And Vomiting  . Garlic Other (See Comments)    indigestion  . Milk-Related Compounds Other (See Comments)    Diarrhea/cramping  . Sorbitol Other (See Comments)    Diarrhea/cramping    VITALS:  Blood pressure (!) 176/70, pulse 97, temperature 99.1 F (37.3 C), temperature source Temporal, resp. rate 18, height 5' (1.524 m), weight 37.2 kg, SpO2 100 %.  PHYSICAL EXAMINATION:   Physical Exam  GENERAL:  86 y.o.-year-old patient lying in the bed with no acute distress. Thin cachectic EYES: Pupils equal, round, reactive to light and accommodation. No scleral icterus.   HEENT: Head atraumatic, normocephalic. Oropharynx and nasopharynx clear.  NECK:  Supple, no jugular venous distention. No thyroid enlargement, no tenderness.  LUNGS: Normal breath sounds bilaterally,  no wheezing, rales, rhonchi. No use of accessory muscles of respiration.  CARDIOVASCULAR: S1, S2 normal. No murmurs, rubs, or gallops.  ABDOMEN: Soft, nontender, nondistended. Bowel sounds present. No organomegaly or mass.  EXTREMITIES: left knee limited range of motion. Tenderness over left hip. NEUROLOGIC: Cranial nerves II through XII are intact. No focal Motor or sensory deficits b/l.   PSYCHIATRIC:  patient is alert and awake  SKIN: No obvious rash, lesion, or ulcer.   LABORATORY PANEL:  CBC Recent Labs  Lab 07/19/19 0736  WBC 12.7*  HGB 8.9*  HCT 28.7*  PLT 264    Chemistries  Recent Labs  Lab 07/18/19 0922 07/18/19 0922 07/19/19 0736  NA 136   < > 136  K 3.7   < > 3.7  CL 101   < > 103  CO2 23   < > 22  GLUCOSE 123*   < > 96  BUN 19   < > 20  CREATININE 0.55   < > 0.55  CALCIUM 9.3   < > 8.7*  AST 18  --   --   ALT 19  --   --   ALKPHOS 73  --   --   BILITOT 1.1  --   --    < > = values in this interval not displayed.   Cardiac Enzymes No results for input(s): TROPONINI in the last 168 hours. RADIOLOGY:  DG Ribs Unilateral W/Chest Right  Result Date: 07/18/2019 CLINICAL DATA:  [place], rib pain EXAM: RIGHT RIBS AND CHEST - 3+ VIEW COMPARISON:  02/28/2019 FINDINGS: Frontal and oblique views of the right thoracic cage are obtained. Cardiac silhouette is stable. No acute airspace disease,  effusion, or pneumothorax. There are no acute displaced fractures. IMPRESSION: 1. No acute intrathoracic process. Electronically Signed   By: Sharlet Salina M.D.   On: 07/18/2019 09:04   CT Head Wo Contrast  Result Date: 07/18/2019 CLINICAL DATA:  [place]are[place] Seat, left knee fracture EXAM: CT HEAD WITHOUT CONTRAST TECHNIQUE: Contiguous axial images were obtained from the base of the skull through the vertex without intravenous contrast. COMPARISON:  02/27/2019 FINDINGS: Brain: No acute infarct or hemorrhage. Lateral ventricles and midline structures are unremarkable. No acute extra-axial fluid  collections. No mass effect. Vascular: No hyperdense vessel or unexpected calcification. Skull: Normal. Negative for fracture or focal lesion. Sinuses/Orbits: No acute finding. Other: None IMPRESSION: 1. Stable head CT, no acute intrathoracic process. Electronically Signed   By: Sharlet Salina M.D.   On: 07/18/2019 09:09   DG Knee Complete 4 Views Left  Result Date: 07/18/2019 CLINICAL DATA:  Larey Seat, left knee pain, ecchymosis, swelling EXAM: LEFT KNEE - COMPLETE 4+ VIEW COMPARISON:  02/08/2017 FINDINGS: Frontal, bilateral oblique, and cross-table lateral views of the left knee are obtained. There is a an impacted mildly comminuted fracture involving the distal femoral metaphysis, with ventral angulation at the fracture site. There is approximately 1/2 shaft with dorsal displacement of the distal fracture fragment. Fracture extends into the patellofemoral compartment, with a small lipohemarthrosis identified. There is diffuse soft tissue edema surrounding the left knee. No other fractures. Alignment of the medial and lateral compartments is anatomic. IMPRESSION: 1. Impacted and angulated distal left femoral metaphyseal fracture, with small associated lipohemarthrosis. Electronically Signed   By: Sharlet Salina M.D.   On: 07/18/2019 09:03   DG HIP UNILAT W OR W/O PELVIS 2-3 VIEWS LEFT  Result Date: 07/18/2019 CLINICAL DATA:  [place] Seat, left hip pain EXAM: DG HIP (WITH OR WITHOUT PELVIS) 2-3V LEFT COMPARISON:  02/27/2019 FINDINGS: Frontal view of the pelvis as well as frontal and frogleg lateral views of the left hip are obtained. There are chronic fractures of the bilateral superior and inferior pubic rami. The bones are diffusely osteopenic. No acute displaced fracture. The hips are well aligned. Symmetrical hip osteoarthritis is stable. Continued spondylosis at the lumbosacral junction. IMPRESSION: 1. Chronic bilateral superior and inferior pubic rami fractures. 2. No acute displaced fracture. 3. Severe osteopenia.  Electronically Signed   By: Sharlet Salina M.D.   On: 07/18/2019 09:07   ASSESSMENT AND PLAN:   Christina Mooney is a 86 y.o. female with medical history significant for GERD, chronic low back pain, hypertension, carotid artery disease who was brought into the emergency room for evaluation of a fall.  Patient has been in a skilled nursing facility following a fall with pelvic rim fractures. She ambulates with a rolling walker and states that she has continued to have falls while in the skilled nursing facility  Acute left femur fracture s/p mechanical fall Immobilize left lower extremity Orthopedic consult with Dr Odis Luster noted--plans for surgery today Pain control with prn IV and po pain meds  Carotid artery stenosis--chronic -Patient on Plavix which will be held for possible surgical repair left femur fracture -Continue metoprolol -Not on statins  Hypertension Cont metoprolol  severe protein calorie malnutrition follow-up dietitian recommendations  GERD Continue Protonix    DVT prophylaxis: SCD Code Status: Full Family Communication: Plan of care has been discussed with patient in detail.  Verbalizes understanding and agrees with the plan--pt was seen in PACU Disposition Plan: Back to [facility] in 1-3 days Consults called: Orthopedics-Dr Odis Luster    TOTAL  TIME TAKING CARE OF THIS PATIENT: *25* minutes.  >50% time spent on counselling and coordination of care  Note: This dictation was prepared with Dragon dictation along with smaller phrase technology. Any transcriptional errors that result from this process are unintentional.  Enedina Finner M.D    [facility]   CC: Primary care physician; Marguarite Arbour, MDPatient ID: Christina Mooney, female   DOB: 12/08/1925, 86 y.o.   MRN: [id number]

## 2019-07-19 NOTE — TOC Initial Note (Signed)
Transition of Care [facility]) - Initial/Assessment Note    Patient Details  Name: Christina Mooney MRN: [id number] Date of Birth: 1926/03/03  [facility] CM/SW Contact:    Su Hilt, RN Phone Number: 07/19/2019, 9:57 AM  Clinical Narrative:                 Met with the patient to discuss DC needs and plans, She is a resident at [facility] and plans to return to [place] after surgery, I spoke with Seth Bake at [facility] and sent the referral thru the [facility], the patient has had both Covid vaccines  Expected Discharge Plan: Skilled Nursing Facility Barriers to Discharge: Continued Medical Work up   Patient Goals and CMS Choice Patient states their goals for this hospitalization and ongoing recovery are:: go to SNF      Expected Discharge Plan and Services Expected Discharge Plan: [place]   Discharge Planning Services: CM Consult   Living arrangements for the past 2 months: [place]                 DME Arranged: N/A                    Prior Living Arrangements/Services Living arrangements for the past 2 months: [place] Lives with:: Facility Resident Patient language and need for interpreter reviewed:: Yes Do you feel safe going back to the place where you live?: Yes      Need for Family Participation in Patient Care: No (Comment) Care giver support system in place?: Yes (comment) Current home services: DME(rolling walker) Criminal Activity/Legal Involvement Pertinent to Current Situation/Hospitalization: No - Comment as needed  Activities of Daily Living Home Assistive Devices/Equipment: Eyeglasses ADL Screening (condition at time of admission) Patient's cognitive ability adequate to safely complete daily activities?: No Is the patient deaf or have difficulty hearing?: No Does the patient have difficulty seeing, even when wearing glasses/contacts?: No Does the patient have difficulty  concentrating, remembering, or making decisions?: Yes Patient able to express need for assistance with ADLs?: Yes Does the patient have difficulty dressing or bathing?: Yes Independently performs ADLs?: No Does the patient have difficulty walking or climbing stairs?: Yes Weakness of Legs: Both Weakness of Arms/Hands: Both  Permission Sought/Granted   Permission granted to share information with : Yes, Verbal Permission Granted              Emotional Assessment Appearance:: Appears stated age Attitude/Demeanor/Rapport: Engaged Affect (typically observed): Appropriate Orientation: : Oriented to Situation, Oriented to  Time, Oriented to Place, Oriented to Self Alcohol / Substance Use: Not Applicable Psych Involvement: No (comment)  Admission diagnosis:  Fall, initial encounter [W19.XXXA] Rib contusion, right, initial encounter [S20.211A] Closed fracture of distal end of left femur, unspecified fracture morphology, sequela [S72.402S] Closed fracture of left femur, unspecified fracture morphology, initial encounter [facility]) [S72.92XA] Patient Active Problem List   Diagnosis Date Noted  . Femur fracture, left ([place]) 07/18/2019  . Closed fracture of left distal femur ([place]) 07/18/2019  . Pneumothorax on left   . Pneumothorax, traumatic   . Fall 02/27/2019  . GERD (gastroesophageal reflux disease) 12/28/2015  . Carotid artery stenosis 12/28/2015  . Aortic atherosclerosis ([place]) 12/28/2015  . Hyperlipidemia 12/28/2015  . Essential hypertension, benign 12/28/2015  . Fibromuscular dysplasia ([place]) 12/28/2015   PCP:  Idelle Crouch, MD Pharmacy:   [id number] [place] [place] [address]  Phone: [phone] Fax: [phone]     Social Determinants of Health (SDOH) Interventions    Readmission Risk Interventions No flowsheet data found.

## 2019-07-19 NOTE — Op Note (Signed)
07/19/2019  5:20 PM  PATIENT:  Christina Mooney    PRE-OPERATIVE DIAGNOSIS:  LEFT DISTAL FRACTURE  POST-OPERATIVE DIAGNOSIS:  Same  PROCEDURE:  OPEN REDUCTION INTERNAL FIXATION (ORIF) DISTAL FEMUR FRACTURE, LEFT  SURGEON:  Lyndle Herrlich, MD   ASSIST: Altamese Cabal, PA-C  ANESTHESIA: General and local  PREOPERATIVE INDICATIONS:  Christina Mooney is a  86 y.o. female with a diagnosis of LEFT DISTAL FRACTURE who elected for surgical management after extensive discussion of  the risks benefits and alternatives  with the patient preoperatively including but not limited to the risks of infection, bleeding, nerve injury, cardiopulmonary complications, the need for revision surgery, among others, and the patient was willing to proceed.  EBL: 100 cc  OPERATIVE IMPLANTS: Synthes VA LCP distal femur locking plate  OPERATIVE FINDINGS: displaced, comminuted, angulated, closed left distal femur fracture  OPERATIVE PROCEDURE: The patient was brought to the operating room and underwent satisfactory anesthesia and was placed in the supine position on the HANA table with the operative leg in traction and the contralateral in a well leg holder.  The operative leg was prepped and draped in sterile fashion.  A longitudinal lateral incision was made over the distal third of the femur with dissection carried out sharply through fascia down to bone.  The vastus lateralis was lifted anteriorly to expose the distal shaft.  The soft tissues were debrided off the lateral condyle, allowing elevation of the patella.  Irrigation was used. The fracture fragments were manipulated into the best possible position due to the comminution.  A Synthes 6 hole periprosthetic distal femur plate was aligned along the femur.  It was temporarily fixated with a large bone holding clamp.  The central screw was placed through the distal portion of the plate to bring the plate down to the bone.  The femur was reduced and the distal  locking screws were placed.  Fluoroscopy showed overall good alignment on AP and lateral views with minimal recurvatum. A total of four cortical screws were placed proximally.  Fluoroscopy showed good alignment on AP and lateral views. Wound was again irrigated and closed with #2 Quill on fascia, 2-0 Vicryl on subcutaneous tissue and staples on all skin areas.  A sterile dressing was applied.  Sponge and needle count was correct.  Soft long-leg dressing and knee immobilizer were applied.  The patient was awakened and taken to recovery in good condition.   Christina Smiles, MD

## 2019-07-19 NOTE — Progress Notes (Signed)
Initial Nutrition Assessment  DOCUMENTATION CODES:   Severe malnutrition in context of social or environmental circumstances, Underweight  INTERVENTION:  Provide Ensure Enlive po BID, each supplement provides 350 kcal and 20 grams of protein.  Provide daily MVI.  NUTRITION DIAGNOSIS:   Severe Malnutrition related to social / environmental circumstances(advanced age, inadequate oral intake) as evidenced by severe fat depletion, severe muscle depletion, 13.7% weight loss over the past 5 months.  GOAL:   Patient will meet greater than or equal to 90% of their needs  MONITOR:   PO intake, Diet advancement, Supplement acceptance, Labs, Weight trends, Skin, I & O's  REASON FOR ASSESSMENT:   Consult Hip fracture protocol  ASSESSMENT:   86 year old female with PMHx of HLD, HTN, diverticulosis, PAD, GERD, spinal stenosis of lumbar region, chronic back pain admitted from [facility] admitted after a fall with left femur fracture.   Met with patient at bedside this morning. She was somewhat confused and unable to provide a thorough nutrition history. She kept reporting she has not been able to eat here (NPO) but was unable to describe how she was eating PTA. She is currently NPO for planned ORIF in OR today. She is amenable to drinking Ensure Enlive to help meet calorie/protein needs. 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 Discussed that Ensure does not have any lactose.   Patient reports she is unsure how much weight she has lost. She knows she is around 80 lbs now.  According to chart patient was 48.5 kg on 02/08/2017. Weight has been trending down since then. She was 43.1 kg on 02/27/2019. She is now 37.2 kg (82 lbs). She has lost 5.9 kg (13.7% body weight) over the past 5 months, which is significant for time frame.  Medications reviewed and include: vitamin D3 400 units daily, gabapentin, pantoprazole.  Labs reviewed.  NUTRITION - FOCUSED PHYSICAL EXAM:    Most Recent  Value  Orbital Region  Severe depletion  Upper Arm Region  Severe depletion  Thoracic and Lumbar Region  Severe depletion  Buccal Region  Severe depletion  Temple Region  Severe depletion  Clavicle Bone Region  Severe depletion  Clavicle and Acromion Bone Region  Severe depletion  Scapular Bone Region  Severe depletion  Dorsal Hand  Severe depletion  Patellar Region  Unable to assess  Anterior Thigh Region  Unable to assess  Posterior Calf Region  Unable to assess  Edema (RD Assessment)  None  Hair  Reviewed  Eyes  Reviewed  Mouth  Reviewed  Skin  Reviewed  Nails  Reviewed     Diet Order:   Diet Order            Diet NPO time specified  Diet effective now             EDUCATION NEEDS:   No education needs have been identified at this time  Skin:  Skin Assessment: Reviewed RN Assessment  Last BM:  Unknown  Height:   Ht Readings from Last 1 Encounters:  07/18/19 5' (1.524 m)   Weight:   Wt Readings from Last 1 Encounters:  07/18/19 37.2 kg   Ideal Body Weight:  45.5 kg  BMI:  Body mass index is 16.01 kg/m.  Estimated Nutritional Needs:   Kcal:  1200-1400  Protein:  60-70 grams  Fluid:  1.2-1.4 L/day  Jacklynn Barnacle, MS, RD, LDN Pager number available on Amion

## 2019-07-20 DIAGNOSIS — E43 Unspecified severe protein-calorie malnutrition: Secondary | ICD-10-CM

## 2019-07-20 MED ORDER — POLYETHYLENE GLYCOL 3350 17 G PO PACK
17.0000 g | PACK | ORAL | Status: DC
  Administered 2019-07-20: 14:00:00 17 g via ORAL
  Filled 2019-07-20: qty 1

## 2019-07-20 MED ORDER — CLOPIDOGREL BISULFATE 75 MG PO TABS
75.0000 mg | ORAL_TABLET | Freq: Every day | ORAL | Status: DC
  Administered 2019-07-20 – 2019-07-21 (×2): 75 mg via ORAL
  Filled 2019-07-20 (×2): qty 1

## 2019-07-20 NOTE — Progress Notes (Addendum)
[facility]  - [place] at [facility]   PATIENT NAME: Christina Mooney    MR#:  [id number]  DATE OF BIRTH:  10-Mar-1926  SUBJECTIVE:   More awake alert but at times gets pleasantly confused REVIEW OF SYSTEMS:   Review of Systems  Constitutional: Negative for chills, fever and weight loss.  HENT: Negative for ear discharge, ear pain and nosebleeds.   Eyes: Negative for blurred vision, pain and discharge.  Respiratory: Negative for sputum production, shortness of breath, wheezing and stridor.   Cardiovascular: Negative for chest pain, palpitations, orthopnea and PND.  Gastrointestinal: Negative for abdominal pain, diarrhea, nausea and vomiting.  Genitourinary: Negative for frequency and urgency.  Musculoskeletal: Positive for back pain and joint pain.  Neurological: Positive for weakness. Negative for sensory change, speech change and focal weakness.  Psychiatric/Behavioral: Negative for depression and hallucinations. The patient is not nervous/anxious.    Tolerating Diet:yes Tolerating PT: rehab  DRUG ALLERGIES:   Allergies  Allergen Reactions  . Codeine Nausea And Vomiting  . Garlic Other (See Comments)    indigestion  . Milk-Related Compounds Other (See Comments)    Diarrhea/cramping  . Sorbitol Other (See Comments)    Diarrhea/cramping    VITALS:  Blood pressure (!) 114/51, pulse 89, temperature 98.1 F (36.7 C), temperature source Oral, resp. rate 18, height 5' (1.524 m), weight 37.2 kg, SpO2 90 %.  PHYSICAL EXAMINATION:   Physical Exam  GENERAL:  86 y.o.-year-old patient lying in the bed with no acute distress. Thin cachectic EYES: Pupils equal, round, reactive to light and accommodation. No scleral icterus.   HEENT: Head atraumatic, normocephalic. Oropharynx and nasopharynx clear.  NECK:  Supple, no jugular venous distention. No thyroid enlargement, no tenderness.  LUNGS: Normal breath sounds bilaterally, no wheezing, rales, rhonchi. No use of  accessory muscles of respiration.  CARDIOVASCULAR: S1, S2 normal. No murmurs, rubs, or gallops.  ABDOMEN: Soft, nontender, nondistended. Bowel sounds present. No organomegaly or mass.  EXTREMITIES: left knee limited range of motion. Tenderness over left hip.surgical dressing+ NEUROLOGIC: Cranial nerves II through XII are intact. No focal Motor or sensory deficits b/l.   PSYCHIATRIC:  patient is alert and awake  SKIN: No obvious rash, lesion, or ulcer.   LABORATORY PANEL:  CBC Recent Labs  Lab 07/19/19 0736  WBC 12.7*  HGB 8.9*  HCT 28.7*  PLT 264    Chemistries  Recent Labs  Lab 07/18/19 0922 07/18/19 0922 07/19/19 0736  NA 136   < > 136  K 3.7   < > 3.7  CL 101   < > 103  CO2 23   < > 22  GLUCOSE 123*   < > 96  BUN 19   < > 20  CREATININE 0.55   < > 0.55  CALCIUM 9.3   < > 8.7*  AST 18  --   --   ALT 19  --   --   ALKPHOS 73  --   --   BILITOT 1.1  --   --    < > = values in this interval not displayed.   Cardiac Enzymes No results for input(s): TROPONINI in the last 168 hours. RADIOLOGY:  DG C-Arm 1-60 Min  Result Date: 07/19/2019 CLINICAL DATA:  Internal fixation of fracture EXAM: LEFT FEMUR 2 VIEWS; DG C-ARM 1-60 MIN COMPARISON:  07/18/2019 FINDINGS: Four low resolution intraoperative spot views of the left femur. Total fluoroscopy time was 31 seconds. The images demonstrate surgical plate and multiple screw fixation  of the distal left femur across distal femoral fracture with near anatomic alignment. IMPRESSION: Intraoperative fluoroscopic assistance provided during surgical fixation of distal femoral fracture. Electronically Signed   By: Donavan Foil M.D.   On: 07/19/2019 19:11   DG C-Arm 1-60 Min  Result Date: 07/19/2019 CLINICAL DATA:  Internal fixation of fracture EXAM: LEFT FEMUR 2 VIEWS; DG C-ARM 1-60 MIN COMPARISON:  07/18/2019 FINDINGS: Four low resolution intraoperative spot views of the left femur. Total fluoroscopy time was 31 seconds. The images  demonstrate surgical plate and multiple screw fixation of the distal left femur across distal femoral fracture with near anatomic alignment. IMPRESSION: Intraoperative fluoroscopic assistance provided during surgical fixation of distal femoral fracture. Electronically Signed   By: Donavan Foil M.D.   On: 07/19/2019 19:11   DG FEMUR MIN 2 VIEWS LEFT  Result Date: 07/19/2019 CLINICAL DATA:  Internal fixation of fracture EXAM: LEFT FEMUR 2 VIEWS; DG C-ARM 1-60 MIN COMPARISON:  07/18/2019 FINDINGS: Four low resolution intraoperative spot views of the left femur. Total fluoroscopy time was 31 seconds. The images demonstrate surgical plate and multiple screw fixation of the distal left femur across distal femoral fracture with near anatomic alignment. IMPRESSION: Intraoperative fluoroscopic assistance provided during surgical fixation of distal femoral fracture. Electronically Signed   By: Donavan Foil M.D.   On: 07/19/2019 19:11   ASSESSMENT AND PLAN:   Christina Mooney is a 86 y.o. female with medical history significant for GERD, chronic low back pain, hypertension, carotid artery disease who was brought into the emergency room for evaluation of a fall.  Patient has been in a skilled nursing facility following a fall with pelvic rim fractures. 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 -chronic  Hypertension Cont metoprolol  Severe protein calorie malnutrition/Underweight in context of chronic illness -follow-up dietitian  recommendations  GERD Continue Protonix    DVT prophylaxis: SCD for now-- awaiting ortho response for DVT prophylaxis (secure message sent) Code Status: Full Family Communication: d/w son Ronalee Belts in the room Disposition Plan: Back to [facility] likely tomorrow if ok with Dr Harlow Mares Consults called: Orthopedics-Dr Harlow Mares  Addendum: Per Dr Harlow Mares Plavix should suffice for DVT prophylaxis at this time  TOTAL TIME TAKING CARE OF THIS PATIENT: *25* minutes.  >50% time spent on counselling and coordination of care  Note: This dictation was prepared with Dragon dictation along with smaller phrase technology. Any transcriptional errors that result from this process are unintentional.  Fritzi Mandes M.D    [facility]   CC: Primary care physician; Idelle Crouch, MDPatient ID: Christina Mooney, female   DOB: 06/08/1925, 86 y.o.   MRN: [id number]

## 2019-07-20 NOTE — Evaluation (Signed)
Physical Therapy Evaluation Patient Details Name: Christina Mooney MRN: [id number] DOB: Jun 29, 1925 Today's Date: 07/20/2019   History of Present Illness  Per MD notes: Pt is a 86 y.o. female with medical history significant for GERD, chronic low back pain, hypertension, carotid artery disease who was brought into the emergency room for evaluation of a fall.  Patient has been in a skilled nursing facility following a fall with pelvic rim fractures.  Pt diagnosed with L distal femur fracture and is s/p ORIF.    Clinical Impression  Pt pleasant but somewhat confused during the session having difficulty providing history and requiring extra time and cuing to follow simple commands.  Pt required +2 total assist with bed mobility tasks and to come to standing at the EOB.  Pt was able to maintain LLE WB status with pt's L foot on top of writer's foot but only because of the amount of assist she was receiving to hold her up in standing position.  Pt presents with significant deficits in functional mobility compared to her recent baseline and will benefit from continued PT services in a SNF setting upon discharge to safely address deficits listed in patient problem list for decreased caregiver assistance and eventual return to PLOF.       Follow Up Recommendations SNF    Equipment Recommendations  None recommended by PT    Recommendations for Other Services       Precautions / Restrictions Precautions Precautions: Fall Required Braces or Orthoses: Knee Immobilizer - Left Knee Immobilizer - Left: On at all times Restrictions Weight Bearing Restrictions: Yes LLE Weight Bearing: Non weight bearing Other Position/Activity Restrictions: LLE TTWB and NWB each noted during chart review, clarification pending, pt treated as NWB during PT eval      Mobility  Bed Mobility Overal bed mobility: Needs Assistance Bed Mobility: Supine to Sit;Sit to Supine     Supine to sit: Total assist;+2 for  physical assistance Sit to supine: +2 for physical assistance;Total assist   General bed mobility comments: Pt able to contribute only minimally during sup to/from sit  Transfers Overall transfer level: Needs assistance Equipment used: Rolling walker (2 wheeled) Transfers: Sit to/from Stand Sit to Stand: Total assist;+2 physical assistance         General transfer comment: Pt's L foot placed on top of this PT foot to ensure WB compliance with pt needed +2 total assist to come to standing and to remain in standing as well as to ensure WB compliance  Ambulation/Gait             General Gait Details: Unable  Stairs            Wheelchair Mobility    Modified Rankin (Stroke Patients Only)       Balance Overall balance assessment: Needs assistance   Sitting balance-Leahy Scale: Poor Sitting balance - Comments: Occasional min A to prevent posterior LOB in sitting Postural control: Posterior lean Standing balance support: Bilateral upper extremity supported Standing balance-Leahy Scale: Zero Standing balance comment: +2 total assist to remain in standing even with BUE support on the RW                             Pertinent Vitals/Pain Pain Assessment: No/denies pain    Home Living Family/patient expects to be discharged to:: Skilled nursing facility                 Additional Comments: Pt  at SNF at [facility]    Prior Function Level of Independence: Needs assistance   Gait / Transfers Assistance Needed: Pt working with PT at SNF and was able to amb limited facility distances with a RW, multiple fall history in the last year  ADL's / Homemaking Assistance Needed: Assist from staff with ADLs        Hand Dominance        Extremity/Trunk Assessment   Upper Extremity Assessment Upper Extremity Assessment: Generalized weakness    Lower Extremity Assessment Lower Extremity Assessment: Generalized weakness;LLE deficits/detail LLE  Deficits / Details: Pt unable to perform a LLE SLR, BLE ankle DF/PF strength and AROM WNL LLE: Unable to fully assess due to immobilization LLE Sensation: WNL       Communication   Communication: No difficulties  Cognition Arousal/Alertness: Awake/alert Behavior During Therapy: WFL for tasks assessed/performed Overall Cognitive Status: No family/caregiver present to determine baseline cognitive functioning                                 General Comments: Pt with some confusion and difficulty providing history      General Comments      Exercises Total Joint Exercises Ankle Circles/Pumps: AROM;Strengthening;Both;5 reps;10 reps Heel Slides: AAROM;Right;5 reps Hip ABduction/ADduction: AAROM;Both;10 reps Straight Leg Raises: AAROM;Both;10 reps Long Arc Quad: AROM;Right;5 reps Knee Flexion: AROM;Right;5 reps Other Exercises Other Exercises: Static sitting balance training at EOB with cues for anterior weight shifting   Assessment/Plan    PT Assessment Patient needs continued PT services  PT Problem List Decreased strength;Decreased activity tolerance;Decreased balance;Decreased mobility;Decreased knowledge of use of DME;Pain       PT Treatment Interventions DME instruction;Gait training;Functional mobility training;Therapeutic activities;Therapeutic exercise;Balance training;Patient/family education    PT Goals (Current goals can be found in the Care Plan section)  Acute Rehab PT Goals Patient Stated Goal: To walk again PT Goal Formulation: With patient Time For Goal Achievement: 08/02/19 Potential to Achieve Goals: Fair    Frequency BID   Barriers to discharge        Co-evaluation               AM-PAC PT "6 Clicks" Mobility  Outcome Measure Help needed turning from your back to your side while in a flat bed without using bedrails?: Total Help needed moving from lying on your back to sitting on the side of a flat bed without using bedrails?:  Total Help needed moving to and from a bed to a chair (including a wheelchair)?: Total Help needed standing up from a chair using your arms (e.g., wheelchair or bedside chair)?: Total Help needed to walk in hospital room?: Total Help needed climbing 3-5 steps with a railing? : Total 6 Click Score: 6    End of Session Equipment Utilized During Treatment: Gait belt Activity Tolerance: Patient tolerated treatment well Patient left: in bed;with call bell/phone within reach;with family/visitor present;with nursing/sitter in room;Other (comment)(Nurse and CNA with pt at end of session assisting with linen change and pt hygiene) Nurse Communication: Mobility status;Weight bearing status PT Visit Diagnosis: Unsteadiness on feet (R26.81);Repeated falls (R29.6);Muscle weakness (generalized) (M62.81);Other abnormalities of gait and mobility (R26.89);Pain Pain - Right/Left: Left Pain - part of body: Leg    Time: 0947-0962 PT Time Calculation (min) (ACUTE ONLY): 33 min   Charges:   PT Evaluation $PT Eval Moderate Complexity: 1 Mod PT Treatments $Therapeutic Exercise: 8-22 mins  Ovidio Hanger PT, DPT 07/20/19, 10:45 AM

## 2019-07-20 NOTE — Progress Notes (Signed)
D: Pt alert and oriented x 3, with confusion. Pt denies experiencing any pain at this time.   A: Scheduled medications administered to pt, per MD orders. Support and encouragement provided. Frequent verbal contact made.   R: No adverse drug reactions noted. Pt complaint with medications and treatment plan. Pt interacts well with staff on the unit. Pt is stable at this time, Will continue to monitor to monitor.  Son Brynleigh Sequeira is at bedside.

## 2019-07-20 NOTE — TOC Progression Note (Signed)
Transition of Care [facility]) - Progression Note    Patient Details  Name: Christina Mooney MRN: [id number] Date of Birth: 14-Mar-1926  [facility]) CM/SW Contact  [name], RN Phone Number: 07/20/2019, 11:52 AM  Clinical Narrative:     [name] [facility] to start insurance auth for the patient to go to [facility] plan to DC on Friday Spoke with Aneta Mins The patient is managed by Francine Graven and not Talbot Grumbling, I notified [place]  Expected Discharge Plan: Skilled Nursing Facility Barriers to Discharge: Continued Medical Work up  Expected Discharge Plan and Services Expected Discharge Plan: Skilled Nursing Facility   Discharge Planning Services: CM Consult   Living arrangements for the past 2 months: Skilled Nursing Facility                 DME Arranged: N/A                     Social Determinants of Health (SDOH) Interventions    Readmission Risk Interventions No flowsheet data found.

## 2019-07-20 NOTE — Progress Notes (Signed)
Subjective:  Patient reports pain as mild.  Patient is alert this am  Objective:   VITALS:   Vitals:   07/19/19 2110 07/19/19 2209 07/19/19 2313 07/20/19 0350  BP: 119/61 111/85 139/67 (!) 141/63  Pulse: 84 82 84 85  Resp: 18 18 16 16   Temp: (!) 97.3 F (36.3 C) 97.8 F (36.6 C) 98 F (36.7 C) 97.9 F (36.6 C)  TempSrc: Oral Oral Oral Oral  SpO2: 99% 99% 98% 100%  Weight:      Height:        PHYSICAL EXAM:  Neurologically intact ABD soft Neurovascular intact Sensation intact distally Intact pulses distally Dorsiflexion/Plantar flexion intact Incision: dressing C/D/I No cellulitis present Compartment soft  LABS  Results for orders placed or performed during the hospital encounter of 07/18/19 (from the past 24 hour(s))  CBC     Status: Abnormal   Collection Time: 07/19/19  7:36 AM  Result Value Ref Range   WBC 12.7 (H) 4.0 - 10.5 K/uL   RBC 3.71 (L) 3.87 - 5.11 MIL/uL   Hemoglobin 8.9 (L) 12.0 - 15.0 g/dL   HCT 28.7 (L) 36.0 - 46.0 %   MCV 77.4 (L) 80.0 - 100.0 fL   MCH 24.0 (L) 26.0 - 34.0 pg   MCHC 31.0 30.0 - 36.0 g/dL   RDW 16.3 (H) 11.5 - 15.5 %   Platelets 264 150 - 400 K/uL   nRBC 0.0 0.0 - 0.2 %  Basic metabolic panel     Status: Abnormal   Collection Time: 07/19/19  7:36 AM  Result Value Ref Range   Sodium 136 135 - 145 mmol/L   Potassium 3.7 3.5 - 5.1 mmol/L   Chloride 103 98 - 111 mmol/L   CO2 22 22 - 32 mmol/L   Glucose, Bld 96 70 - 99 mg/dL   BUN 20 8 - 23 mg/dL   Creatinine, Ser 0.55 0.44 - 1.00 mg/dL   Calcium 8.7 (L) 8.9 - 10.3 mg/dL   GFR calc non Af Amer >60 >60 mL/min   GFR calc Af Amer >60 >60 mL/min   Anion gap 11 5 - 15    DG Ribs Unilateral W/Chest Right  Result Date: 07/18/2019 CLINICAL DATA:  [place], rib pain EXAM: RIGHT RIBS AND CHEST - 3+ VIEW COMPARISON:  02/28/2019 FINDINGS: Frontal and oblique views of the right thoracic cage are obtained. Cardiac silhouette is stable. No acute airspace disease, effusion, or pneumothorax.  There are no acute displaced fractures. IMPRESSION: 1. No acute intrathoracic process. Electronically Signed   By: Randa Ngo M.D.   On: 07/18/2019 09:04   CT Head Wo Contrast  Result Date: 07/18/2019 CLINICAL DATA:  [place], left knee fracture EXAM: CT HEAD WITHOUT CONTRAST TECHNIQUE: Contiguous axial images were obtained from the base of the skull through the vertex without intravenous contrast. COMPARISON:  02/27/2019 FINDINGS: Brain: No acute infarct or hemorrhage. Lateral ventricles and midline structures are unremarkable. No acute extra-axial fluid collections. No mass effect. Vascular: No hyperdense vessel or unexpected calcification. Skull: Normal. Negative for fracture or focal lesion. Sinuses/Orbits: No acute finding. Other: None IMPRESSION: 1. Stable head CT, no acute intrathoracic process. Electronically Signed   By: Randa Ngo M.D.   On: 07/18/2019 09:09   DG Knee Complete 4 Views Left  Result Date: 07/18/2019 CLINICAL DATA:  [place], left knee pain, ecchymosis, swelling EXAM: LEFT KNEE - COMPLETE 4+ VIEW COMPARISON:  02/08/2017 FINDINGS: Frontal, bilateral oblique, and cross-table lateral views of the left knee are obtained.  There is a an impacted mildly comminuted fracture involving the distal femoral metaphysis, with ventral angulation at the fracture site. There is approximately 1/2 shaft with dorsal displacement of the distal fracture fragment. Fracture extends into the patellofemoral compartment, with a small lipohemarthrosis identified. There is diffuse soft tissue edema surrounding the left knee. No other fractures. Alignment of the medial and lateral compartments is anatomic. IMPRESSION: 1. Impacted and angulated distal left femoral metaphyseal fracture, with small associated lipohemarthrosis. Electronically Signed   By: Sharlet Salina M.D.   On: 07/18/2019 09:03   DG C-Arm 1-60 Min  Result Date: 07/19/2019 CLINICAL DATA:  Internal fixation of fracture EXAM: LEFT FEMUR 2 VIEWS; DG  C-ARM 1-60 MIN COMPARISON:  07/18/2019 FINDINGS: Four low resolution intraoperative spot views of the left femur. Total fluoroscopy time was 31 seconds. The images demonstrate surgical plate and multiple screw fixation of the distal left femur across distal femoral fracture with near anatomic alignment. IMPRESSION: Intraoperative fluoroscopic assistance provided during surgical fixation of distal femoral fracture. Electronically Signed   By: Jasmine Pang M.D.   On: 07/19/2019 19:11   DG C-Arm 1-60 Min  Result Date: 07/19/2019 CLINICAL DATA:  Internal fixation of fracture EXAM: LEFT FEMUR 2 VIEWS; DG C-ARM 1-60 MIN COMPARISON:  07/18/2019 FINDINGS: Four low resolution intraoperative spot views of the left femur. Total fluoroscopy time was 31 seconds. The images demonstrate surgical plate and multiple screw fixation of the distal left femur across distal femoral fracture with near anatomic alignment. IMPRESSION: Intraoperative fluoroscopic assistance provided during surgical fixation of distal femoral fracture. Electronically Signed   By: Jasmine Pang M.D.   On: 07/19/2019 19:11   DG HIP UNILAT W OR W/O PELVIS 2-3 VIEWS LEFT  Result Date: 07/18/2019 CLINICAL DATA:  Larey Seat, left hip pain EXAM: DG HIP (WITH OR WITHOUT PELVIS) 2-3V LEFT COMPARISON:  02/27/2019 FINDINGS: Frontal view of the pelvis as well as frontal and frogleg lateral views of the left hip are obtained. There are chronic fractures of the bilateral superior and inferior pubic rami. The bones are diffusely osteopenic. No acute displaced fracture. The hips are well aligned. Symmetrical hip osteoarthritis is stable. Continued spondylosis at the lumbosacral junction. IMPRESSION: 1. Chronic bilateral superior and inferior pubic rami fractures. 2. No acute displaced fracture. 3. Severe osteopenia. Electronically Signed   By: Sharlet Salina M.D.   On: 07/18/2019 09:07   DG FEMUR MIN 2 VIEWS LEFT  Result Date: 07/19/2019 CLINICAL DATA:  Internal  fixation of fracture EXAM: LEFT FEMUR 2 VIEWS; DG C-ARM 1-60 MIN COMPARISON:  07/18/2019 FINDINGS: Four low resolution intraoperative spot views of the left femur. Total fluoroscopy time was 31 seconds. The images demonstrate surgical plate and multiple screw fixation of the distal left femur across distal femoral fracture with near anatomic alignment. IMPRESSION: Intraoperative fluoroscopic assistance provided during surgical fixation of distal femoral fracture. Electronically Signed   By: Jasmine Pang M.D.   On: 07/19/2019 19:11    Assessment/Plan: 1 Day Post-Op   Principal Problem:   Femur fracture, left (HCC) Active Problems:   GERD (gastroesophageal reflux disease)   Carotid artery stenosis   Essential hypertension, benign   Fall   Closed fracture of left distal femur (HCC)   Protein-calorie malnutrition, severe   Advance diet Up with therapy  Discharge per medicine when stable probable SNF Non weightbearing Left lower extremity Aspirin daily H&H stable Tramadol for pain   Altamese Cabal , PA-C 07/20/2019, 6:39 AM

## 2019-07-20 NOTE — Progress Notes (Signed)
Physical Therapy Treatment Patient Details Name: Christina Mooney MRN: [id number] DOB: 15-Sep-1925 Today's Date: 07/20/2019    History of Present Illness Per MD notes: Pt is a 86 y.o. female with medical history significant for GERD, chronic low back pain, hypertension, carotid artery disease who was brought into the emergency room for evaluation of a fall.  Patient has been in a skilled nursing facility following a fall with pelvic rim fractures.  Pt diagnosed with L distal femur fracture and is s/p ORIF.    PT Comments    Pt pleasant and motivated to participate during the session but somewhat confused requiring extra cuing and time to process commands.  Pt remained total assist for bed mobility and transfers. Pt required +2 total assist to stand but even with extensive assist was unable to come to full upright standing position.  Pt's L foot was on top of writer's foot throughout standing to ensure WB compliance. Pt will benefit from PT services in a SNF setting upon discharge to safely address deficits listed in patient problem list for decreased caregiver assistance and eventual return to PLOF.      Follow Up Recommendations  SNF     Equipment Recommendations  None recommended by PT    Recommendations for Other Services       Precautions / Restrictions Precautions Precautions: Fall Required Braces or Orthoses: Knee Immobilizer - Left Knee Immobilizer - Left: On at all times Restrictions Weight Bearing Restrictions: Yes LLE Weight Bearing: Touchdown weight bearing    Mobility  Bed Mobility Overal bed mobility: Needs Assistance Bed Mobility: Supine to Sit;Sit to Supine     Supine to sit: Total assist;+2 for physical assistance Sit to supine: +2 for physical assistance;Total assist   General bed mobility comments: Pt able to contribute only minimally during sup to/from sit  Transfers Overall transfer level: Needs assistance Equipment used: Rolling walker (2  wheeled) Transfers: Sit to/from Stand Sit to Stand: Total assist;+2 physical assistance         General transfer comment: Pt's L foot placed on top of this PT foot to ensure WB compliance with pt needed +2 total assist to come to standing and to remain in standing as well as to ensure WB compliance  Ambulation/Gait             General Gait Details: Unable   Stairs             Wheelchair Mobility    Modified Rankin (Stroke Patients Only)       Balance Overall balance assessment: Needs assistance   Sitting balance-Leahy Scale: Poor Sitting balance - Comments: Occasional min A to prevent posterior LOB in sitting Postural control: Posterior lean Standing balance support: Bilateral upper extremity supported Standing balance-Leahy Scale: Zero Standing balance comment: +2 total assist to remain in standing even with BUE support on the RW                            Cognition Arousal/Alertness: Awake/alert Behavior During Therapy: [facility] for tasks assessed/performed Overall Cognitive Status: Impaired/Different from baseline Area of Impairment: Orientation;Following commands                       Following Commands: Follows one step commands inconsistently       General Comments: Pt with some confusion and difficulty providing history      Exercises Total Joint Exercises Ankle Circles/Pumps: AROM;Strengthening;Both;5 reps;10 reps Heel Slides:  AAROM;Right;5 reps Hip ABduction/ADduction: AAROM;Both;10 reps;5 reps Straight Leg Raises: AAROM;Both;10 reps;5 reps Long Arc Quad: AROM;Right;5 reps Knee Flexion: AROM;Right;5 reps Other Exercises Other Exercises: Static sitting balance training at EOB with cues for anterior weight shifting    General Comments        Pertinent Vitals/Pain Pain Assessment: No/denies pain    Home Living Family/patient expects to be discharged to:: Skilled nursing facility               Additional  Comments: Pt at SNF at [facility]    Prior Function Level of Independence: Needs assistance  Gait / Transfers Assistance Needed: Pt working with PT at SNF and was able to amb limited facility distances with a RW, multiple fall history in the last year ADL's / Homemaking Assistance Needed: Assist from staff with ADLs     PT Goals (current goals can now be found in the care plan section) Acute Rehab PT Goals Patient Stated Goal: To walk again PT Goal Formulation: With patient Time For Goal Achievement: 08/02/19 Potential to Achieve Goals: Fair Progress towards PT goals: Not progressing toward goals - comment(Pt limited by weight bearing status and significant functional weakness)    Frequency    BID      PT Plan Current plan remains appropriate    Co-evaluation              AM-PAC PT "6 Clicks" Mobility   Outcome Measure  Help needed turning from your back to your side while in a flat bed without using bedrails?: Total Help needed moving from lying on your back to sitting on the side of a flat bed without using bedrails?: Total Help needed moving to and from a bed to a chair (including a wheelchair)?: Total Help needed standing up from a chair using your arms (e.g., wheelchair or bedside chair)?: Total Help needed to walk in hospital room?: Total Help needed climbing 3-5 steps with a railing? : Total 6 Click Score: 6    End of Session Equipment Utilized During Treatment: Gait belt;Oxygen Activity Tolerance: Patient tolerated treatment well Patient left: in bed;with call bell/phone within reach;with family/visitor present;Other (comment);with bed alarm set Nurse Communication: Mobility status;Weight bearing status PT Visit Diagnosis: Unsteadiness on feet (R26.81);Repeated falls (R29.6);Muscle weakness (generalized) (M62.81);Other abnormalities of gait and mobility (R26.89);Pain Pain - Right/Left: Left Pain - part of body: Leg     Time: 1344-1410 PT Time Calculation  (min) (ACUTE ONLY): 26 min  Charges:  $Therapeutic Exercise: 8-22 mins $Therapeutic Activity: 8-22 mins                     D. [name] Hakiem Malizia PT, DPT 07/20/19, 2:35 PM

## 2019-07-21 DIAGNOSIS — K219 Gastro-esophageal reflux disease without esophagitis: Secondary | ICD-10-CM

## 2019-07-21 LAB — SARS CORONAVIRUS 2 (TAT 6-24 HRS): SARS Coronavirus 2: NEGATIVE

## 2019-07-21 MED ORDER — DIAZEPAM 2 MG PO TABS: 2.0000 mg | ORAL_TABLET | Freq: Three times a day (TID) | ORAL | 0 refills | Status: DC | PRN

## 2019-07-21 MED ORDER — TRAMADOL HCL 50 MG PO TABS: 50.0000 mg | ORAL_TABLET | Freq: Four times a day (QID) | ORAL | 0 refills | Status: DC | PRN

## 2019-07-21 MED ORDER — ADULT MULTIVITAMIN W/MINERALS CH: 1.0000 | ORAL_TABLET | Freq: Every day | ORAL | 0 refills | Status: DC

## 2019-07-21 MED ORDER — ENSURE ENLIVE PO LIQD: 237.0000 mL | Freq: Two times a day (BID) | ORAL | 12 refills | Status: DC

## 2019-07-21 NOTE — TOC Transition Note (Signed)
Transition of Care [name]) - CM/SW Discharge Note   Patient Details  Name: Christina Mooney MRN: [id number] Date of Birth: 1925/08/03  [name] CM/SW Contact:  [name], RN Phone Number: 07/21/2019, 1:28 PM   Clinical Narrative:    Patient to DC to [name] today via EMS transport, The bedside nurse to call report, the patient's son scott made aware RNCM called EMS the patient is next in line, The bedside nurse is aware that they are on their way to transport   Final next level of care: Skilled Nursing Facility Barriers to Discharge: Continued Medical Work up   Patient Goals and CMS Choice Patient states their goals for this hospitalization and ongoing recovery are:: go to SNF      Discharge Placement                       Discharge Plan and Services   Discharge Planning Services: CM Consult            DME Arranged: N/A                    Social Determinants of Health (SDOH) Interventions     Readmission Risk Interventions No flowsheet data found.

## 2019-07-21 NOTE — Progress Notes (Signed)
Physical Therapy Treatment Patient Details Name: Christina Mooney MRN: [id number] DOB: [birth year] Today's Date: 07/21/2019    History of Present Illness Per MD notes: Pt is a 86 y.o. female with medical history significant for GERD, chronic low back pain, hypertension, carotid artery disease who was brought into the emergency room for evaluation of a fall.  Patient has been in a skilled nursing facility following a fall with pelvic rim fractures.  Pt diagnosed with L distal femur fracture and is s/p ORIF.    PT Comments    Pt pleasant and motivated to participate during the session but continued to require extensive +2 assist with functional mobility tasks.  Pt was able to sit at the EOB with grossly improved balance but continued to require occasional min A to prevent posterior LOB.  Pt unable to tolerate attempts at standing with PT assist this session secondary to rib pain but was able to do isometric pressing into the floor with her R foot.  Pt will benefit from PT services in a SNF setting upon discharge to safely address deficits listed in patient problem list for decreased caregiver assistance and eventual return to PLOF.     Follow Up Recommendations  SNF     Equipment Recommendations  None recommended by PT    Recommendations for Other Services       Precautions / Restrictions Precautions Precautions: Fall Required Braces or Orthoses: Knee Immobilizer - Left Knee Immobilizer - Left: On at all times Restrictions Weight Bearing Restrictions: Yes LLE Weight Bearing: Touchdown weight bearing    Mobility  Bed Mobility Overal bed mobility: Needs Assistance Bed Mobility: Supine to Sit;Sit to Supine     Supine to sit: Total assist;+2 for physical assistance Sit to supine: +2 for physical assistance;Total assist   General bed mobility comments: Pt able to contribute only minimally during sup to/from sit  Transfers Overall transfer level: Needs assistance Equipment used:  Rolling walker (2 wheeled) Transfers: Sit to/from Stand           General transfer comment: Pt exerted force through the RLE into the floor at the EOB x 10 but was unable to clear the surface of the bed; total assist not provided this session secondary to pt's rib pain with attempt  Ambulation/Gait             General Gait Details: Unable   Stairs             Wheelchair Mobility    Modified Rankin (Stroke Patients Only)       Balance Overall balance assessment: Needs assistance   Sitting balance-Leahy Scale: Poor Sitting balance - Comments: Occasional min A to prevent posterior LOB in sitting Postural control: Posterior lean                                  Cognition Arousal/Alertness: Awake/alert Behavior During Therapy: WFL for tasks assessed/performed Overall Cognitive Status: Impaired/Different from baseline Area of Impairment: Orientation;Following commands                       Following Commands: Follows one step commands inconsistently       General Comments: Pt with some confusion with extra cuing and time to process required to follow commands      Exercises Total Joint Exercises Ankle Circles/Pumps: AROM;Strengthening;Both;5 reps;10 reps Heel Slides: AAROM;Right;5 reps Hip ABduction/ADduction: AAROM;Both;10 reps;5 reps Straight Leg Raises: AAROM;Both;10  reps;5 reps Long Arc Quad: AROM;Right;5 reps;10 reps Knee Flexion: AROM;Right;5 reps;10 reps    General Comments        Pertinent Vitals/Pain Pain Assessment: 0-10 Pain Score: 8  Pain Location: LLE Pain Descriptors / Indicators: Sore Pain Intervention(s): Premedicated before session;Monitored during session    Home Living                      Prior Function            PT Goals (current goals can now be found in the care plan section) Progress towards PT goals: Not progressing toward goals - comment(Pt limited by pain, LLE TTWB status, and  weakness)    Frequency    BID      PT Plan Current plan remains appropriate    Co-evaluation              AM-PAC PT "6 Clicks" Mobility   Outcome Measure  Help needed turning from your back to your side while in a flat bed without using bedrails?: Total Help needed moving from lying on your back to sitting on the side of a flat bed without using bedrails?: Total Help needed moving to and from a bed to a chair (including a wheelchair)?: Total Help needed standing up from a chair using your arms (e.g., wheelchair or bedside chair)?: Total Help needed to walk in hospital room?: Total Help needed climbing 3-5 steps with a railing? : Total 6 Click Score: 6    End of Session Equipment Utilized During Treatment: Gait belt;Oxygen Activity Tolerance: Patient limited by pain Patient left: in bed;with call bell/phone within reach;with family/visitor present;with bed alarm set;with SCD's reapplied Nurse Communication: Mobility status PT Visit Diagnosis: Unsteadiness on feet (R26.81);Repeated falls (R29.6);Muscle weakness (generalized) (M62.81);Other abnormalities of gait and mobility (R26.89);Pain Pain - Right/Left: Left Pain - part of body: Leg     Time: 6283-6629 PT Time Calculation (min) (ACUTE ONLY): 27 min  Charges:  $Therapeutic Exercise: 8-22 mins $Therapeutic Activity: 8-22 mins                     D. [name] Eadie Repetto PT, DPT 07/21/19, 11:01 AM

## 2019-07-21 NOTE — Discharge Summary (Signed)
[facility] NAME: Christina Mooney    MR#:  [id number]  DATE OF BIRTH:  1925-06-11  DATE OF ADMISSION:  07/18/2019  ADMITTING PHYSICIAN: Collier Bullock, MD  DATE OF DISCHARGE: 07/21/2019  PRIMARY CARE PHYSICIAN: Idelle Crouch, MD    ADMISSION DIAGNOSIS:  Fall, initial encounter [W19.XXXA] Rib contusion, right, initial encounter [S20.211A] Closed fracture of distal end of left femur, unspecified fracture morphology, sequela [S72.402S] Closed fracture of left femur, unspecified fracture morphology, initial encounter ([place]) [S72.92XA]  DISCHARGE DIAGNOSIS:  Closed fracture of Distal Left Femur s/p ORIF  SECONDARY DIAGNOSIS:   Past Medical History:  Diagnosis Date  . Chronic back pain   . Diverticulosis   . GERD (gastroesophageal reflux disease)   . Hyperlipidemia   . Hypertension   . Irritable bowel syndrome   . Lumbago   . Peripheral arterial disease ([place])   . Spinal stenosis of lumbar region with neurogenic claudication     HOSPITAL COURSE:   [place] a 86 y.o.femalewith medical history significant forGERD,chronic low back pain,hypertension,carotid artery disease who was brought into the emergency room for evaluation of a fall.Patient has been in a skilled nursing facility following a fall with pelvic rim fractures. She ambulates with a rolling walker and states that she has continued to have falls while in the skilled nursing facility  Acute left femur fracture s/p mechanical fall -POD# 2 ORIF distal femur -Orthopedic consult with Dr Harlow Mares noted--ok from his side for discharge -Pain control with prn IV and po pain meds -post op mild drop in hgb (expected)--pt has anemia of chronic dz -Dr Harlow Mares recommends plavix is sufficient for DVT prophylaxis  Carotid artery stenosis--chronic -Patient on Plavix--will resume it now -Continue metoprolol -Not on statins  H/o Spinal stenosis lumbar region  with neurogenic claudication -chronic  Hypertension -Cont metoprolol  Severe protein calorie malnutrition/Underweight in context of chronic illness -follow-up dietitian recommendations  GERD Continue Protonix  DVT prophylaxis:Plavix --ortho aware Code Status:Full Family Communication:d/w son Ronalee Belts in the room Disposition Plan:Back to [place] today, ok from  Dr Harlow Mares standpoint Consults called:Orthopedics-Dr Harlow Mares  CONSULTS OBTAINED:  Treatment Team:  Lovell Sheehan, MD  DRUG ALLERGIES:   Allergies  Allergen Reactions  . Codeine Nausea And Vomiting  . Garlic Other (See Comments)    indigestion  . Milk-Related Compounds Other (See Comments)    Diarrhea/cramping  . Sorbitol Other (See Comments)    Diarrhea/cramping    DISCHARGE MEDICATIONS:   Allergies as of 07/21/2019      Reactions   Codeine Nausea And Vomiting   Garlic Other (See Comments)   indigestion   Milk-related Compounds Other (See Comments)   Diarrhea/cramping   Sorbitol Other (See Comments)   Diarrhea/cramping      Medication List    TAKE these medications   acetaminophen 500 MG tablet Commonly known as: TYLENOL Take 1,000 mg by mouth 2 (two) times daily as needed. What changed: Another medication with the same name was removed. Continue taking this medication, and follow the directions you see here.   cholecalciferol 10 MCG (400 UNIT) Tabs tablet Commonly known as: VITAMIN D3 Take 400 Units by mouth daily.   clopidogrel 75 MG tablet Commonly known as: PLAVIX TAKE 1 TABLET BY MOUTH DAILY   diazepam 2 MG tablet Commonly known as: VALIUM Take 1 tablet (2 mg total) by mouth every 8 (eight) hours as needed (dizziness).   esomeprazole 40 MG capsule Commonly known as:  NEXIUM Take 40 mg by mouth daily.   feeding supplement (ENSURE ENLIVE) Liqd Take 237 mLs by mouth 2 (two) times daily between meals.   gabapentin 100 MG capsule Commonly known as: NEURONTIN Take 100 mg by  mouth at bedtime.   metoprolol succinate 25 MG 24 hr tablet Commonly known as: TOPROL-XL Take 12.5 mg by mouth daily.   multivitamin with minerals Tabs tablet Take 1 tablet by mouth daily. Start taking on: July 22, 2019   polyethylene glycol 17 g packet Commonly known as: MIRALAX / GLYCOLAX Take 17 g by mouth every other day.   traMADol 50 MG tablet Commonly known as: ULTRAM Take 1 tablet (50 mg total) by mouth every 6 (six) hours as needed for moderate pain.       If you experience worsening of your admission symptoms, develop shortness of breath, life threatening emergency, suicidal or homicidal thoughts you must seek medical attention immediately by calling 911 or calling your MD immediately  if symptoms less severe.  You Must read complete instructions/literature along with all the possible adverse reactions/side effects for all the Medicines you take and that have been prescribed to you. Take any new Medicines after you have completely understood and accept all the possible adverse reactions/side effects.   Please note  You were cared for by a hospitalist during your hospital stay. If you have any questions about your discharge medications or the care you received while you were in the hospital after you are discharged, you can call the unit and asked to speak with the hospitalist on call if the hospitalist that took care of you is not available. Once you are discharged, your primary care physician will handle any further medical issues. Please note that NO REFILLS for any discharge medications will be authorized once you are discharged, as it is imperative that you return to your primary care physician (or establish a relationship with a primary care physician if you do not have one) for your aftercare needs so that they can reassess your need for medications and monitor your lab values. Today   SUBJECTIVE  Eating BF, now new issues per RN Needs BM Pain better per pt Slept  overall ok  VITAL SIGNS:  Blood pressure (!) 141/70, pulse 86, temperature 98.3 F (36.8 C), temperature source Oral, resp. rate 17, height 5' (1.524 m), weight 37.2 kg, SpO2 99 %.  I/O:    Intake/Output Summary (Last 24 hours) at 07/21/2019 0934 Last data filed at 07/21/2019 0600 Gross per 24 hour  Intake 1605.17 ml  Output --  Net 1605.17 ml    PHYSICAL EXAMINATION:  GENERAL:  86 y.o.-year-old patient lying in the bed with no acute distress. Thin, cachectic EYES: Pupils equal, round, reactive to light and accommodation. No scleral icterus.  HEENT: Head atraumatic, normocephalic. Oropharynx and nasopharynx clear.  NECK:  Supple, no jugular venous distention. No thyroid enlargement, no tenderness.  LUNGS: Normal breath sounds bilaterally, no wheezing, rales,rhonchi or crepitation. No use of accessory muscles of respiration.  CARDIOVASCULAR: S1, S2 normal. No murmurs, rubs, or gallops.  ABDOMEN: Soft, non-tender, non-distended. Bowel sounds present. No organomegaly or mass.  EXTREMITIES: No pedal edema, cyanosis, or clubbing.  NEUROLOGIC: Cranial nerves II through XII are intact. Muscle strength 5/5 in all extremities. Sensation intact. Gait not checked.  PSYCHIATRIC:  patient is alert and awake SKIN: No obvious rash, lesion, or ulcer.   DATA REVIEW:   CBC  Recent Labs  Lab 07/19/19 0736  WBC 12.7*  HGB 8.9*  HCT 28.7*  PLT 264    Chemistries  Recent Labs  Lab 07/18/19 0922 07/18/19 0922 07/19/19 0736  NA 136   < > 136  K 3.7   < > 3.7  CL 101   < > 103  CO2 23   < > 22  GLUCOSE 123*   < > 96  BUN 19   < > 20  CREATININE 0.55   < > 0.55  CALCIUM 9.3   < > 8.7*  AST 18  --   --   ALT 19  --   --   ALKPHOS 73  --   --   BILITOT 1.1  --   --    < > = values in this interval not displayed.    Microbiology Results   Recent Results (from the past 240 hour(s))  Respiratory Panel by RT PCR (Flu A&B, Covid) - Nasopharyngeal Swab     Status: None   Collection  Time: 07/18/19 11:25 AM   Specimen: Nasopharyngeal Swab  Result Value Ref Range Status   SARS Coronavirus 2 by RT PCR NEGATIVE NEGATIVE Final    Comment: (NOTE) SARS-CoV-2 target nucleic acids are NOT DETECTED. The SARS-CoV-2 RNA is generally detectable in upper respiratoy specimens during the acute phase of infection. The lowest concentration of SARS-CoV-2 viral copies this assay can detect is 131 copies/mL. A negative result does not preclude SARS-Cov-2 infection and should not be used as the sole basis for treatment or other patient management decisions. A negative result may occur with  improper specimen collection/handling, submission of specimen other than nasopharyngeal swab, presence of viral mutation(s) within the areas targeted by this assay, and inadequate number of viral copies (<131 copies/mL). A negative result must be combined with clinical observations, patient history, and epidemiological information. The expected result is Negative. Fact Sheet for Patients:  https://www.moore.com/ Fact Sheet for Healthcare Providers:  https://www.young.biz/ This test is not yet ap proved or cleared by the [place] FDA and  has been authorized for detection and/or diagnosis of SARS-CoV-2 by FDA under an Emergency Use Authorization (EUA). This EUA will remain  in effect (meaning this test can be used) for the duration of the COVID-19 declaration under Section 564(b)(1) of the Act, 21 U.S.C. section 360bbb-3(b)(1), unless the authorization is terminated or revoked sooner.    Influenza A by PCR NEGATIVE NEGATIVE Final   Influenza B by PCR NEGATIVE NEGATIVE Final    Comment: (NOTE) The Xpert Xpress SARS-CoV-2/FLU/RSV assay is intended as an aid in  the diagnosis of influenza from Nasopharyngeal swab specimens and  should not be used as a sole basis for treatment. Nasal washings and  aspirates are unacceptable for Xpert Xpress  SARS-CoV-2/FLU/RSV  testing. Fact Sheet for Patients: https://www.moore.com/ Fact Sheet for Healthcare Providers: https://www.young.biz/ This test is not yet approved or cleared by the [place] FDA and  has been authorized for detection and/or diagnosis of SARS-CoV-2 by  FDA under an Emergency Use Authorization (EUA). This EUA will remain  in effect (meaning this test can be used) for the duration of the  Covid-19 declaration under Section 564(b)(1) of the Act, 21  U.S.C. section 360bbb-3(b)(1), unless the authorization is  terminated or revoked. Performed at [facility], [address] [place], [place] [address]   SARS CORONAVIRUS 2 (TAT 6-24 HRS) Nasopharyngeal Nasopharyngeal Swab     Status: None   Collection Time: 07/20/19  3:51 PM   Specimen: Nasopharyngeal Swab  Result Value  Ref Range Status   SARS Coronavirus 2 NEGATIVE NEGATIVE Final    Comment: (NOTE) SARS-CoV-2 target nucleic acids are NOT DETECTED. The SARS-CoV-2 RNA is generally detectable in upper and lower respiratory specimens during the acute phase of infection. Negative results do not preclude SARS-CoV-2 infection, do not rule out co-infections with other pathogens, and should not be used as the sole basis for treatment or other patient management decisions. Negative results must be combined with clinical observations, patient history, and epidemiological information. The expected result is Negative. Fact Sheet for Patients: HairSlick.no Fact Sheet for Healthcare Providers: quierodirigir.com This test is not yet approved or cleared by the [place] FDA and  has been authorized for detection and/or diagnosis of SARS-CoV-2 by FDA under an Emergency Use Authorization (EUA). This EUA will remain  in effect (meaning this test can be used) for the duration of the COVID-19 declaration under Section 56  4(b)(1) of the Act, 21 U.S.C. section 360bbb-3(b)(1), unless the authorization is terminated or revoked sooner. Performed at [facility], [address], [place], [place] [address]     RADIOLOGY:  DG C-Arm 1-60 Min  Result Date: 07/19/2019 CLINICAL DATA:  Internal fixation of fracture EXAM: LEFT FEMUR 2 VIEWS; DG C-ARM 1-60 MIN COMPARISON:  07/18/2019 FINDINGS: Four low resolution intraoperative spot views of the left femur. Total fluoroscopy time was 31 seconds. The images demonstrate surgical plate and multiple screw fixation of the distal left femur across distal femoral fracture with near anatomic alignment. IMPRESSION: Intraoperative fluoroscopic assistance provided during surgical fixation of distal femoral fracture. Electronically Signed   By: Jasmine Pang M.D.   On: 07/19/2019 19:11   DG C-Arm 1-60 Min  Result Date: 07/19/2019 CLINICAL DATA:  Internal fixation of fracture EXAM: LEFT FEMUR 2 VIEWS; DG C-ARM 1-60 MIN COMPARISON:  07/18/2019 FINDINGS: Four low resolution intraoperative spot views of the left femur. Total fluoroscopy time was 31 seconds. The images demonstrate surgical plate and multiple screw fixation of the distal left femur across distal femoral fracture with near anatomic alignment. IMPRESSION: Intraoperative fluoroscopic assistance provided during surgical fixation of distal femoral fracture. Electronically Signed   By: Jasmine Pang M.D.   On: 07/19/2019 19:11   DG FEMUR MIN 2 VIEWS LEFT  Result Date: 07/19/2019 CLINICAL DATA:  Internal fixation of fracture EXAM: LEFT FEMUR 2 VIEWS; DG C-ARM 1-60 MIN COMPARISON:  07/18/2019 FINDINGS: Four low resolution intraoperative spot views of the left femur. Total fluoroscopy time was 31 seconds. The images demonstrate surgical plate and multiple screw fixation of the distal left femur across distal femoral fracture with near anatomic alignment. IMPRESSION: Intraoperative fluoroscopic assistance provided during surgical  fixation of distal femoral fracture. Electronically Signed   By: Jasmine Pang M.D.   On: 07/19/2019 19:11     CODE STATUS:     Code Status Orders  (From admission, onward)         Start     Ordered   07/18/19 1400  Full code  Continuous     07/18/19 1359        Code Status History    Date Active Date Inactive Code Status Order ID Comments User Context   02/27/2019 0902 03/02/2019 2239 Full Code [id number]  Enedina Finner, MD ED   Advance Care Planning Activity    Advance Directive Documentation     Most Recent Value  Type of Advance Directive  Healthcare Power of Attorney, Living will  Pre-existing out of facility DNR order (yellow form or  pink MOST form)  --  "MOST" Form in Place?  --       TOTAL TIME TAKING CARE OF THIS PATIENT: *40* minutes.    Enedina FinnerSona Meta Kroenke M.D  [facility]    CC: Primary care physician; Marguarite ArbourSparks, Jeffrey D, MD

## 2019-07-21 NOTE — Care Management Important Message (Signed)
Important Message  Patient Details  Name: Christina Mooney MRN: [id number] Date of Birth: 1926-04-04   Medicare Important Message Given:  Yes     [name] 07/21/2019, 10:38 AM

## 2019-07-21 NOTE — Progress Notes (Signed)
IV removed from patient. VSS. Report called to [facility], EMS is here and transporting the patient. No acute distress at this time. Son is at bedside and updated on plan of care.

## 2019-07-21 NOTE — Progress Notes (Signed)
Subjective:  Patient reports pain as mild.  Doing well dressing changed  Objective:   VITALS:   Vitals:   07/20/19 1623 07/20/19 2007 07/21/19 0007 07/21/19 0747  BP: 118/72  (!) 127/59 (!) 141/70  Pulse: 88  87 86  Resp: 18  17 17   Temp: 98.3 F (36.8 C)  99.2 F (37.3 C) 98.3 F (36.8 C)  TempSrc: Oral  Oral Oral  SpO2: 98% 98% 99% 99%  Weight:      Height:        PHYSICAL EXAM:  Neurologically intact ABD soft Neurovascular intact Sensation intact distally Intact pulses distally Dorsiflexion/Plantar flexion intact Incision: scant drainage No cellulitis present Compartment soft dressing changed  LABS  Results for orders placed or performed during the hospital encounter of 07/18/19 (from the past 24 hour(s))  SARS CORONAVIRUS 2 (TAT 6-24 HRS) Nasopharyngeal Nasopharyngeal Swab     Status: None   Collection Time: 07/20/19  3:51 PM   Specimen: Nasopharyngeal Swab  Result Value Ref Range   SARS Coronavirus 2 NEGATIVE NEGATIVE    DG C-Arm 1-60 Min  Result Date: 07/19/2019 CLINICAL DATA:  Internal fixation of fracture EXAM: LEFT FEMUR 2 VIEWS; DG C-ARM 1-60 MIN COMPARISON:  07/18/2019 FINDINGS: Four low resolution intraoperative spot views of the left femur. Total fluoroscopy time was 31 seconds. The images demonstrate surgical plate and multiple screw fixation of the distal left femur across distal femoral fracture with near anatomic alignment. IMPRESSION: Intraoperative fluoroscopic assistance provided during surgical fixation of distal femoral fracture. Electronically Signed   By: 09/17/2019 M.D.   On: 07/19/2019 19:11   DG C-Arm 1-60 Min  Result Date: 07/19/2019 CLINICAL DATA:  Internal fixation of fracture EXAM: LEFT FEMUR 2 VIEWS; DG C-ARM 1-60 MIN COMPARISON:  07/18/2019 FINDINGS: Four low resolution intraoperative spot views of the left femur. Total fluoroscopy time was 31 seconds. The images demonstrate surgical plate and multiple screw fixation of the  distal left femur across distal femoral fracture with near anatomic alignment. IMPRESSION: Intraoperative fluoroscopic assistance provided during surgical fixation of distal femoral fracture. Electronically Signed   By: 09/17/2019 M.D.   On: 07/19/2019 19:11   DG FEMUR MIN 2 VIEWS LEFT  Result Date: 07/19/2019 CLINICAL DATA:  Internal fixation of fracture EXAM: LEFT FEMUR 2 VIEWS; DG C-ARM 1-60 MIN COMPARISON:  07/18/2019 FINDINGS: Four low resolution intraoperative spot views of the left femur. Total fluoroscopy time was 31 seconds. The images demonstrate surgical plate and multiple screw fixation of the distal left femur across distal femoral fracture with near anatomic alignment. IMPRESSION: Intraoperative fluoroscopic assistance provided during surgical fixation of distal femoral fracture. Electronically Signed   By: 09/17/2019 M.D.   On: 07/19/2019 19:11    Assessment/Plan: 2 Days Post-Op   Principal Problem:   Femur fracture, left (HCC) Active Problems:   GERD (gastroesophageal reflux disease)   Carotid artery stenosis   Essential hypertension, benign   Fall   Closed fracture of left femur, unspecified fracture morphology, initial encounter (HCC)   Protein-calorie malnutrition, severe   Advance diet Up with therapy  Okay to discharge from ortho standpoint Non weightbearing Left lower extremity Please allow aquacel dressing to stay and ace wrap for compression Stay in knee immobilizer and keep knee in extension.  No pillow under knee but encourage something under heel and elevation Continue Plavix 75 for blood clot prevention Continue Tramadol for pain Follow up in Dr. 09/18/2019 office on 3/22 or 3/23 call office for appointment [phone]  [place] , PA-C 07/21/2019, 12:57 PM

## 2019-07-21 NOTE — Anesthesia Postprocedure Evaluation (Signed)
Anesthesia Post Note  Patient: Christina Mooney  Procedure(s) Performed: OPEN REDUCTION INTERNAL FIXATION (ORIF) DISTAL FEMUR FRACTURE (Left Leg Lower)  Patient location during evaluation: PACU Anesthesia Type: General Level of consciousness: awake and alert and oriented Pain management: pain level controlled Vital Signs Assessment: post-procedure vital signs reviewed and stable Respiratory status: spontaneous breathing Cardiovascular status: blood pressure returned to baseline Anesthetic complications: no     Last Vitals:  Vitals:   07/21/19 0007 07/21/19 0747  BP: (!) 127/59 (!) 141/70  Pulse: 87 86  Resp: 17 17  Temp: 37.3 C 36.8 C  SpO2: 99% 99%    Last Pain:  Vitals:   07/21/19 0747  TempSrc: Oral  PainSc:                  Smrithi Pigford

## 2019-07-24 DIAGNOSIS — S72401A Unspecified fracture of lower end of right femur, initial encounter for closed fracture: Secondary | ICD-10-CM | POA: Diagnosis not present

## 2019-07-26 DIAGNOSIS — I5030 Unspecified diastolic (congestive) heart failure: Secondary | ICD-10-CM

## 2019-07-26 DIAGNOSIS — I10 Essential (primary) hypertension: Secondary | ICD-10-CM | POA: Diagnosis not present

## 2019-07-26 DIAGNOSIS — M48061 Spinal stenosis, lumbar region without neurogenic claudication: Secondary | ICD-10-CM

## 2019-07-26 DIAGNOSIS — I2510 Atherosclerotic heart disease of native coronary artery without angina pectoris: Secondary | ICD-10-CM

## 2019-07-26 DIAGNOSIS — K219 Gastro-esophageal reflux disease without esophagitis: Secondary | ICD-10-CM | POA: Diagnosis not present

## 2019-07-26 DIAGNOSIS — S3289XA Fracture of other parts of pelvis, initial encounter for closed fracture: Secondary | ICD-10-CM

## 2019-07-26 DIAGNOSIS — S7292XD Unspecified fracture of left femur, subsequent encounter for closed fracture with routine healing: Secondary | ICD-10-CM

## 2019-07-26 DIAGNOSIS — E43 Unspecified severe protein-calorie malnutrition: Secondary | ICD-10-CM | POA: Diagnosis not present

## 2019-07-31 DIAGNOSIS — L03114 Cellulitis of left upper limb: Secondary | ICD-10-CM | POA: Diagnosis not present

## 2019-09-14 DIAGNOSIS — K219 Gastro-esophageal reflux disease without esophagitis: Secondary | ICD-10-CM | POA: Diagnosis not present

## 2019-09-14 DIAGNOSIS — I739 Peripheral vascular disease, unspecified: Secondary | ICD-10-CM | POA: Diagnosis not present

## 2019-09-14 DIAGNOSIS — I209 Angina pectoris, unspecified: Secondary | ICD-10-CM | POA: Diagnosis not present

## 2019-09-14 DIAGNOSIS — S72302A Unspecified fracture of shaft of left femur, initial encounter for closed fracture: Secondary | ICD-10-CM | POA: Diagnosis not present

## 2019-11-05 IMAGING — MR MR HEAD WO/W CM
13 series · 48 of 48 positions shown · IV contrast (gadavist)
Comparison: Head CT 02/08/2017 and MRI 10/27/2016

CLINICAL DATA: Diplopia and vertigo. Intermittent symptoms for 2
years.

EXAM:
MRI HEAD WITHOUT AND WITH CONTRAST
TECHNIQUE: Multiplanar, multiecho pulse sequences of the brain and surrounding
structures were obtained without and with intravenous contrast.
CONTRAST:  4 mL Gadavist

[Series 5: ax dwi_tracew · axial · 3.0mm · 0.60mm/px · z∈[-61,+100]mm · 4 of 55 slices shown]
[im 1/55]
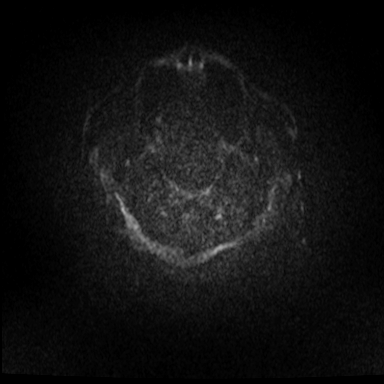
[im 19/55]
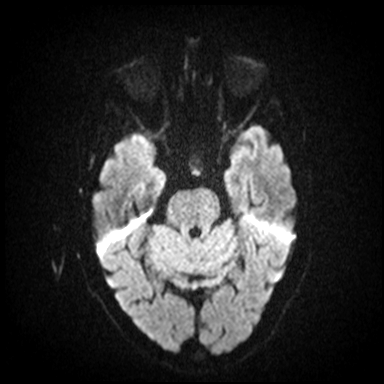
[im 37/55]
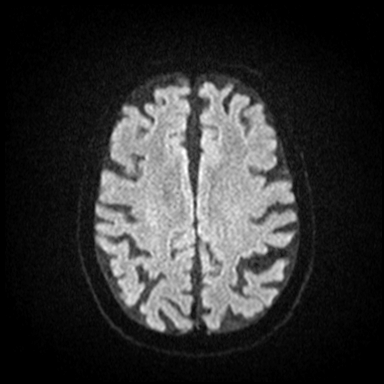
[im 55/55]
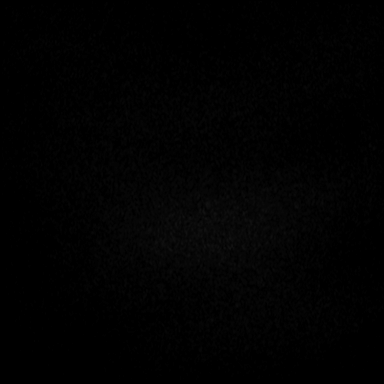

[Series 6: ax dwi_adc · axial · 3.0mm · 0.60mm/px · z∈[-61,+100]mm · 4 of 55 slices shown]
[im 1/55]
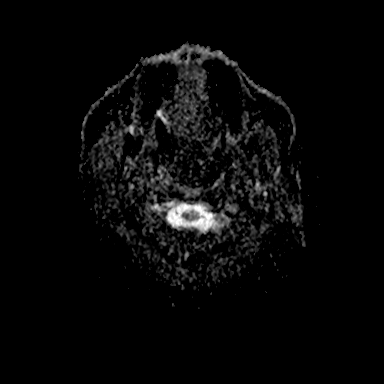
[im 19/55]
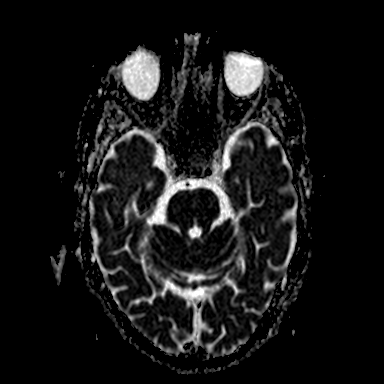
[im 37/55]
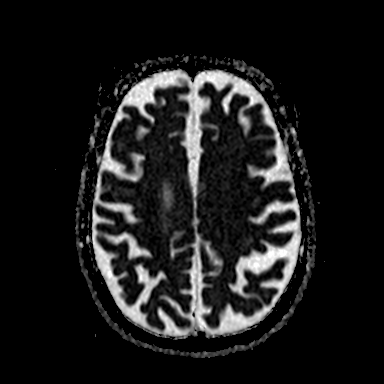
[im 55/55]
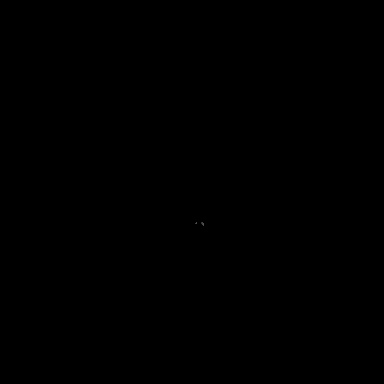

[Series 7: cor dwi_tracew · coronal · 5.0mm · 0.60mm/px · 2 of 39 slices shown]
[im 1/39]
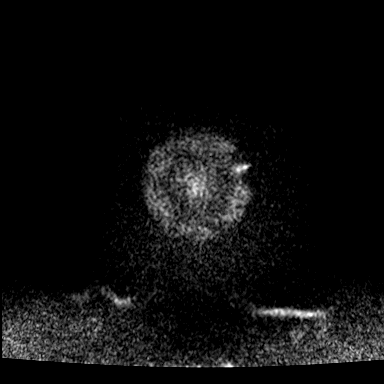
[im 39/39]
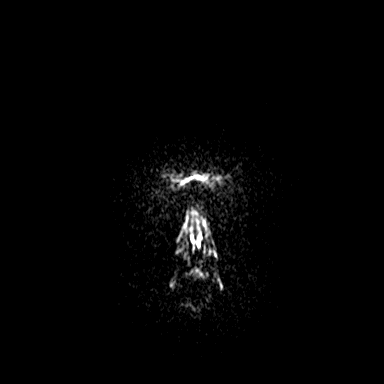

[Series 8: cor dwi_adc · coronal · 5.0mm · 0.60mm/px · 2 of 39 slices shown]
[im 1/39]
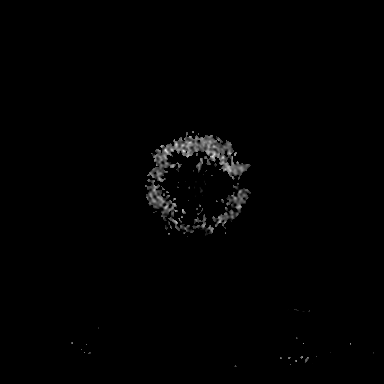
[im 39/39]
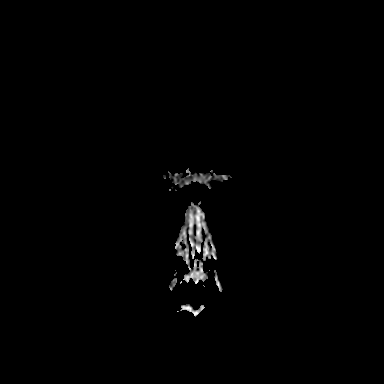

[Series 9: T1 · sagittal · 5.0mm · 0.62mm/px · 1 of 23 slices shown (1 of 2)]
[im 1/23]
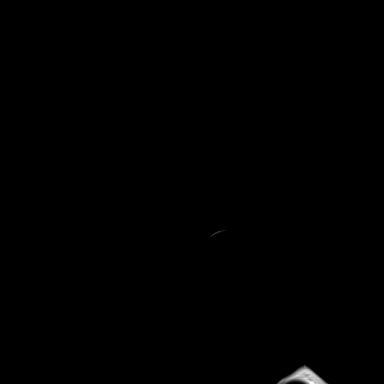

[Series 10: T2 · axial · 5.0mm · 0.53mm/px · 1 of 25 slices shown]
[im 1/25]
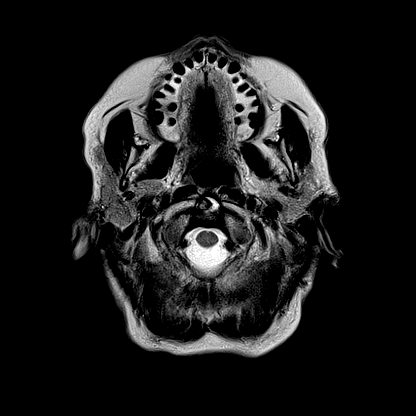

[Series 12: pha_images · axial · 3.0mm · 0.90mm/px · z∈[-67,+109]mm · 3 of 57 slices shown]
[im 1/57]
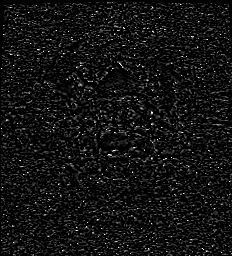
[im 29/57]
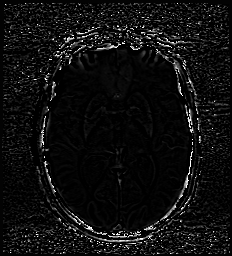
[im 57/57]
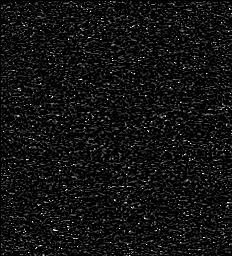

[Series 13: swi_images · axial · 3.0mm · 0.90mm/px · z∈[-67,+109]mm · 4 of 60 slices shown]
[im 1/60]
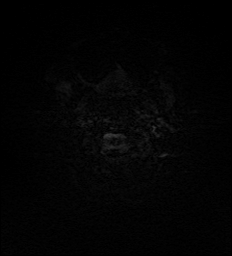
[im 20/60]
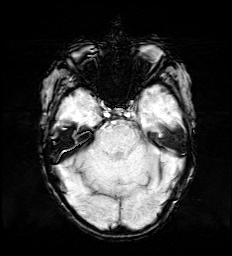
[im 40/60]
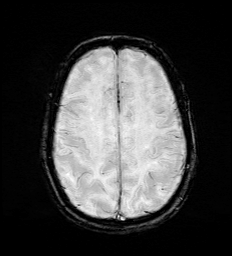
[im 60/60]
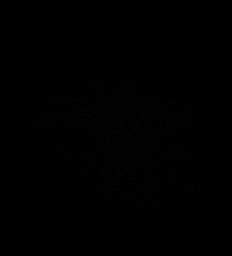

[Series 15: FLAIR · axial · 3.0mm · 0.53mm/px · z∈[-60,+101]mm · 3 of 55 slices shown]
[im 1/55]
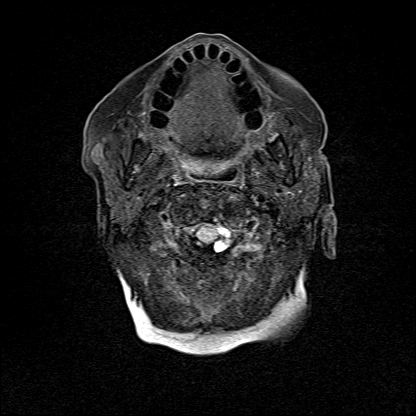
[im 28/55]
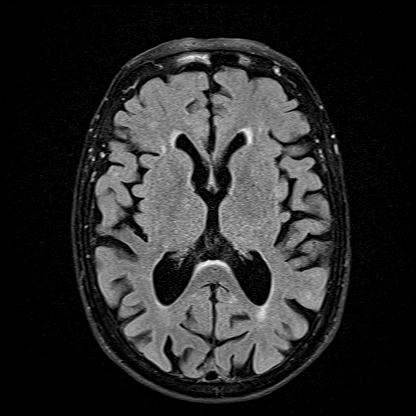
[im 55/55]
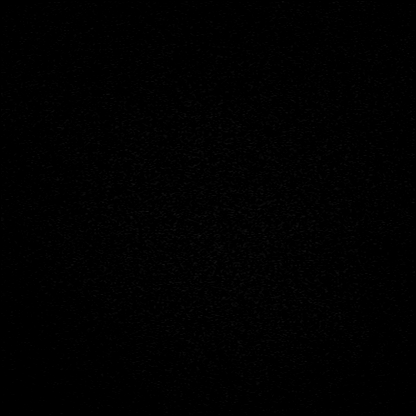

[Series 16: T1 · axial · 1.0mm · 0.98mm/px · z∈[-65,+109]mm · 10 of 175 slices shown (2 of 2)]
[im 1/175]
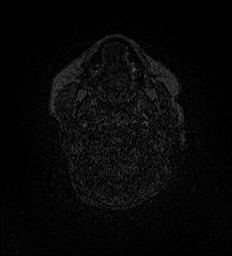
[im 20/175]
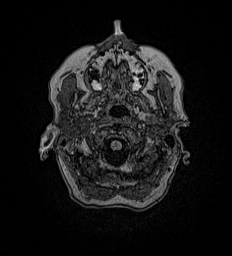
[im 39/175]
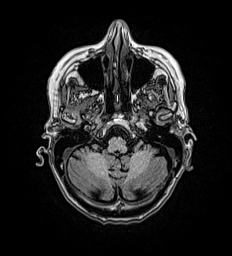
[im 59/175]
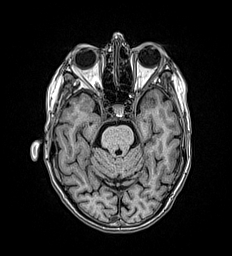
[im 78/175]
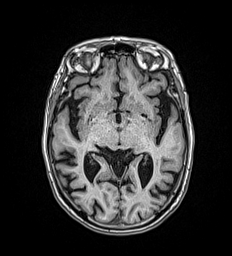
[im 97/175]
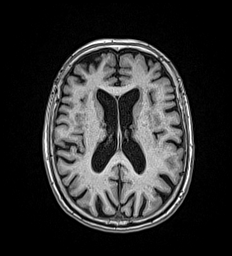
[im 117/175]
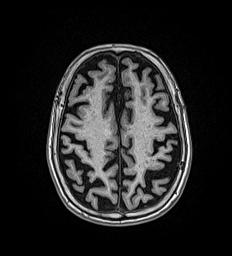
[im 136/175]
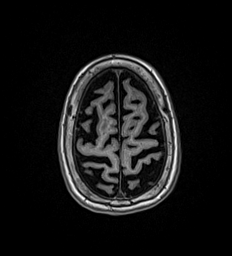
[im 155/175]
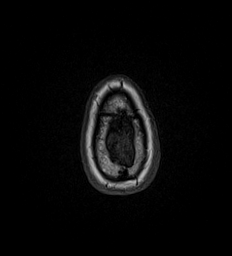
[im 175/175]
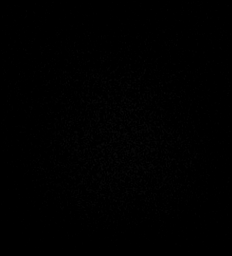

[Series 18: T1 post-contrast · axial · 1.0mm · 0.98mm/px · z∈[-65,+109]mm · 10 of 175 slices shown (1 of 2)]
[im 1/175]
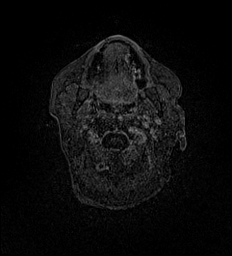
[im 20/175]
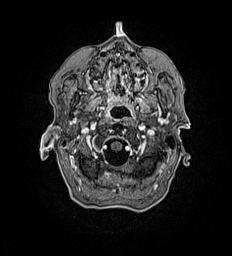
[im 39/175]
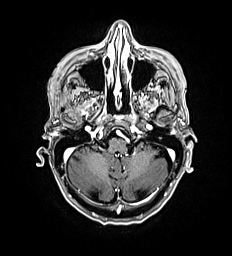
[im 59/175]
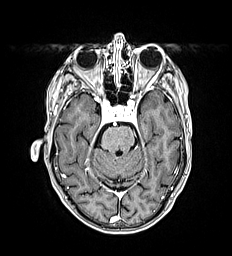
[im 78/175]
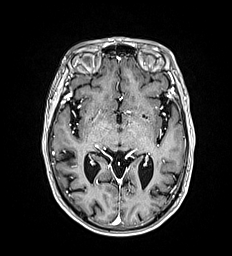
[im 97/175]
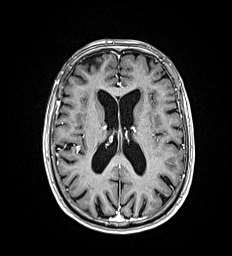
[im 117/175]
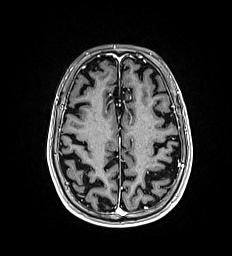
[im 136/175]
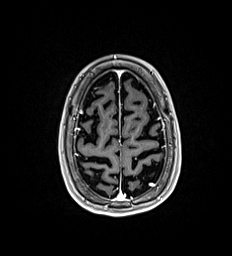
[im 155/175]
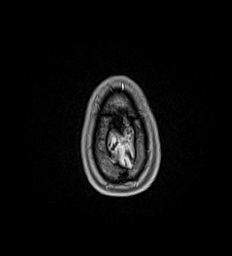
[im 175/175]
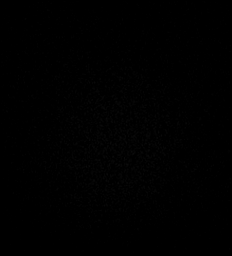

[Series 19: T1 post-contrast · coronal · 5.0mm · 0.57mm/px · 2 of 29 slices shown (2 of 2)]
[im 1/29]
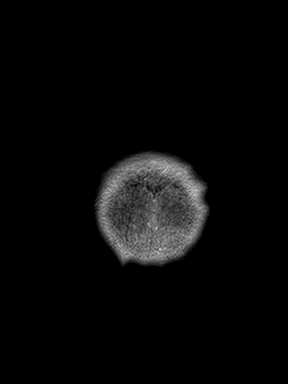
[im 29/29]
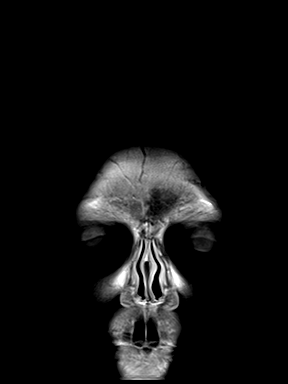

[Series 20: T2 post-contrast · coronal · 5.0mm · 0.57mm/px · 2 of 29 slices shown]
[im 1/29]
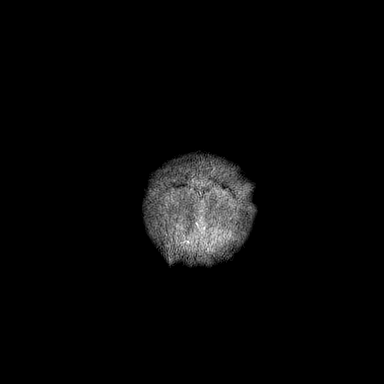
[im 29/29]
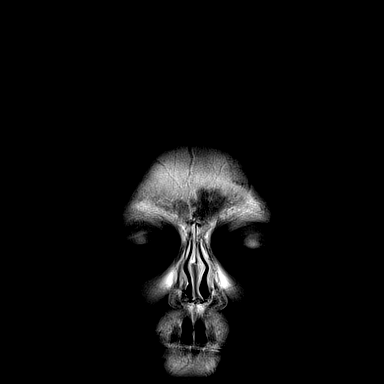

[48 of 48 positions shown; findings below may reference images not displayed]

FINDINGS: Brain: There is no evidence of acute infarct, intracranial
hemorrhage, mass, midline shift, or extra-axial fluid collection.
Cerebral atrophy is unchanged and likely within normal limits for
age. Patchy T2 hyperintensities in the cerebral white matter are
unchanged and nonspecific but compatible with mild-to-moderate
chronic small vessel ischemic disease. No abnormal enhancement is
identified.

Vascular: Major intracranial vascular flow voids are preserved.

Skull and upper cervical spine: Unremarkable bone marrow signal.
Unchanged ligamentous thickening/pannus posterior to the dens.
Chronic grade 1 anterolisthesis of C4 on C5.

Sinuses/Orbits: Bilateral cataract extraction. Paranasal sinuses and
mastoid air cells are clear.

Other: None.
IMPRESSION: 1. No acute intracranial abnormality.
2. Mild-to-moderate chronic small vessel ischemic disease.

## 2019-12-26 DIAGNOSIS — M25562 Pain in left knee: Secondary | ICD-10-CM

## 2020-01-23 DIAGNOSIS — M48061 Spinal stenosis, lumbar region without neurogenic claudication: Secondary | ICD-10-CM | POA: Diagnosis not present

## 2020-01-23 DIAGNOSIS — K219 Gastro-esophageal reflux disease without esophagitis: Secondary | ICD-10-CM

## 2020-01-23 DIAGNOSIS — I209 Angina pectoris, unspecified: Secondary | ICD-10-CM

## 2020-01-23 DIAGNOSIS — E441 Mild protein-calorie malnutrition: Secondary | ICD-10-CM

## 2020-01-23 DIAGNOSIS — I739 Peripheral vascular disease, unspecified: Secondary | ICD-10-CM

## 2020-01-23 DIAGNOSIS — I5032 Chronic diastolic (congestive) heart failure: Secondary | ICD-10-CM | POA: Diagnosis not present

## 2020-02-13 DIAGNOSIS — K5900 Constipation, unspecified: Secondary | ICD-10-CM

## 2020-02-23 DIAGNOSIS — B351 Tinea unguium: Secondary | ICD-10-CM | POA: Diagnosis not present

## 2020-03-13 DIAGNOSIS — K219 Gastro-esophageal reflux disease without esophagitis: Secondary | ICD-10-CM | POA: Diagnosis not present

## 2020-03-13 DIAGNOSIS — I5030 Unspecified diastolic (congestive) heart failure: Secondary | ICD-10-CM

## 2020-03-13 DIAGNOSIS — I10 Essential (primary) hypertension: Secondary | ICD-10-CM

## 2020-03-13 DIAGNOSIS — E43 Unspecified severe protein-calorie malnutrition: Secondary | ICD-10-CM | POA: Diagnosis not present

## 2020-03-13 DIAGNOSIS — I2510 Atherosclerotic heart disease of native coronary artery without angina pectoris: Secondary | ICD-10-CM

## 2020-03-13 DIAGNOSIS — M545 Low back pain, unspecified: Secondary | ICD-10-CM

## 2020-04-21 ENCOUNTER — Emergency Department: Payer: Medicare PPO

## 2020-04-21 ENCOUNTER — Inpatient Hospital Stay
Admission: EM | Admit: 2020-04-21 | Discharge: 2020-04-29 | DRG: 480 | Disposition: A | Discharge status: Skilled Nursing Facility | Payer: Medicare PPO | Source: Skilled Nursing Facility | Attending: Internal Medicine | Admitting: Internal Medicine

## 2020-04-21 ENCOUNTER — Encounter: Payer: Self-pay | Admitting: Emergency Medicine

## 2020-04-21 ENCOUNTER — Other Ambulatory Visit: Payer: Self-pay

## 2020-04-21 ENCOUNTER — Inpatient Hospital Stay: Payer: Medicare PPO

## 2020-04-21 DIAGNOSIS — B9620 Unspecified Escherichia coli [E. coli] as the cause of diseases classified elsewhere: Secondary | ICD-10-CM | POA: Diagnosis present

## 2020-04-21 DIAGNOSIS — Z7189 Other specified counseling: Secondary | ICD-10-CM | POA: Diagnosis not present

## 2020-04-21 DIAGNOSIS — Z01818 Encounter for other preprocedural examination: Secondary | ICD-10-CM

## 2020-04-21 DIAGNOSIS — M8580 Other specified disorders of bone density and structure, unspecified site: Secondary | ICD-10-CM | POA: Diagnosis present

## 2020-04-21 DIAGNOSIS — K5900 Constipation, unspecified: Secondary | ICD-10-CM | POA: Diagnosis present

## 2020-04-21 DIAGNOSIS — N3001 Acute cystitis with hematuria: Secondary | ICD-10-CM

## 2020-04-21 DIAGNOSIS — D509 Iron deficiency anemia, unspecified: Secondary | ICD-10-CM | POA: Diagnosis not present

## 2020-04-21 DIAGNOSIS — Z8249 Family history of ischemic heart disease and other diseases of the circulatory system: Secondary | ICD-10-CM

## 2020-04-21 DIAGNOSIS — S7222XA Displaced subtrochanteric fracture of left femur, initial encounter for closed fracture: Principal | ICD-10-CM | POA: Diagnosis present

## 2020-04-21 DIAGNOSIS — R54 Age-related physical debility: Secondary | ICD-10-CM | POA: Diagnosis present

## 2020-04-21 DIAGNOSIS — F0390 Unspecified dementia without behavioral disturbance: Secondary | ICD-10-CM | POA: Diagnosis present

## 2020-04-21 DIAGNOSIS — E876 Hypokalemia: Secondary | ICD-10-CM | POA: Diagnosis present

## 2020-04-21 DIAGNOSIS — S0101XA Laceration without foreign body of scalp, initial encounter: Secondary | ICD-10-CM | POA: Diagnosis present

## 2020-04-21 DIAGNOSIS — D62 Acute posthemorrhagic anemia: Secondary | ICD-10-CM | POA: Diagnosis not present

## 2020-04-21 DIAGNOSIS — K5790 Diverticulosis of intestine, part unspecified, without perforation or abscess without bleeding: Secondary | ICD-10-CM | POA: Diagnosis present

## 2020-04-21 DIAGNOSIS — E782 Mixed hyperlipidemia: Secondary | ICD-10-CM | POA: Diagnosis present

## 2020-04-21 DIAGNOSIS — Y92129 Unspecified place in nursing home as the place of occurrence of the external cause: Secondary | ICD-10-CM

## 2020-04-21 DIAGNOSIS — I10 Essential (primary) hypertension: Secondary | ICD-10-CM | POA: Diagnosis present

## 2020-04-21 DIAGNOSIS — N3000 Acute cystitis without hematuria: Secondary | ICD-10-CM | POA: Diagnosis present

## 2020-04-21 DIAGNOSIS — K219 Gastro-esophageal reflux disease without esophagitis: Secondary | ICD-10-CM | POA: Diagnosis present

## 2020-04-21 DIAGNOSIS — E860 Dehydration: Secondary | ICD-10-CM | POA: Diagnosis not present

## 2020-04-21 DIAGNOSIS — S7225XD Nondisplaced subtrochanteric fracture of left femur, subsequent encounter for closed fracture with routine healing: Secondary | ICD-10-CM | POA: Diagnosis not present

## 2020-04-21 DIAGNOSIS — I6529 Occlusion and stenosis of unspecified carotid artery: Secondary | ICD-10-CM | POA: Diagnosis present

## 2020-04-21 DIAGNOSIS — E785 Hyperlipidemia, unspecified: Secondary | ICD-10-CM | POA: Diagnosis present

## 2020-04-21 DIAGNOSIS — G629 Polyneuropathy, unspecified: Secondary | ICD-10-CM | POA: Diagnosis present

## 2020-04-21 DIAGNOSIS — S7225XA Nondisplaced subtrochanteric fracture of left femur, initial encounter for closed fracture: Secondary | ICD-10-CM

## 2020-04-21 DIAGNOSIS — Z419 Encounter for procedure for purposes other than remedying health state, unspecified: Secondary | ICD-10-CM

## 2020-04-21 DIAGNOSIS — Z79899 Other long term (current) drug therapy: Secondary | ICD-10-CM

## 2020-04-21 DIAGNOSIS — Z7902 Long term (current) use of antithrombotics/antiplatelets: Secondary | ICD-10-CM

## 2020-04-21 DIAGNOSIS — S0101XS Laceration without foreign body of scalp, sequela: Secondary | ICD-10-CM | POA: Diagnosis not present

## 2020-04-21 DIAGNOSIS — Z91011 Allergy to milk products: Secondary | ICD-10-CM

## 2020-04-21 DIAGNOSIS — I739 Peripheral vascular disease, unspecified: Secondary | ICD-10-CM | POA: Diagnosis present

## 2020-04-21 DIAGNOSIS — W19XXXA Unspecified fall, initial encounter: Secondary | ICD-10-CM | POA: Diagnosis present

## 2020-04-21 DIAGNOSIS — Z91018 Allergy to other foods: Secondary | ICD-10-CM

## 2020-04-21 DIAGNOSIS — Z515 Encounter for palliative care: Secondary | ICD-10-CM | POA: Diagnosis not present

## 2020-04-21 DIAGNOSIS — I773 Arterial fibromuscular dysplasia: Secondary | ICD-10-CM | POA: Diagnosis present

## 2020-04-21 DIAGNOSIS — Z20822 Contact with and (suspected) exposure to covid-19: Secondary | ICD-10-CM | POA: Diagnosis present

## 2020-04-21 DIAGNOSIS — E43 Unspecified severe protein-calorie malnutrition: Secondary | ICD-10-CM | POA: Diagnosis present

## 2020-04-21 DIAGNOSIS — S0990XA Unspecified injury of head, initial encounter: Secondary | ICD-10-CM

## 2020-04-21 DIAGNOSIS — Z885 Allergy status to narcotic agent status: Secondary | ICD-10-CM

## 2020-04-21 DIAGNOSIS — I700 Atherosclerosis of aorta: Secondary | ICD-10-CM | POA: Diagnosis present

## 2020-04-21 DIAGNOSIS — R627 Adult failure to thrive: Secondary | ICD-10-CM | POA: Diagnosis present

## 2020-04-21 DIAGNOSIS — S72142A Displaced intertrochanteric fracture of left femur, initial encounter for closed fracture: Secondary | ICD-10-CM

## 2020-04-21 DIAGNOSIS — Z91048 Other nonmedicinal substance allergy status: Secondary | ICD-10-CM

## 2020-04-21 LAB — CBC WITH DIFFERENTIAL/PLATELET
Abs Immature Granulocytes: 0.15 10*3/uL — ABNORMAL HIGH (ref 0.00–0.07)
Basophils Absolute: 0 10*3/uL (ref 0.0–0.1)
Basophils Relative: 0 %
Eosinophils Absolute: 0 10*3/uL (ref 0.0–0.5)
Eosinophils Relative: 0 %
HCT: 36.7 % (ref 36.0–46.0)
Hemoglobin: 12.2 g/dL (ref 12.0–15.0)
Immature Granulocytes: 1 %
Lymphocytes Relative: 5 %
Lymphs Abs: 0.5 10*3/uL — ABNORMAL LOW (ref 0.7–4.0)
MCH: 28.8 pg (ref 26.0–34.0)
MCHC: 33.2 g/dL (ref 30.0–36.0)
MCV: 86.6 fL (ref 80.0–100.0)
Monocytes Absolute: 0.4 10*3/uL (ref 0.1–1.0)
Monocytes Relative: 4 %
Neutro Abs: 10 10*3/uL — ABNORMAL HIGH (ref 1.7–7.7)
Neutrophils Relative %: 90 %
Platelets: 349 10*3/uL (ref 150–400)
RBC: 4.24 MIL/uL (ref 3.87–5.11)
RDW: 14.3 % (ref 11.5–15.5)
WBC: 11.1 10*3/uL — ABNORMAL HIGH (ref 4.0–10.5)
nRBC: 0 % (ref 0.0–0.2)

## 2020-04-21 LAB — COMPREHENSIVE METABOLIC PANEL
ALT: 30 U/L (ref 0–44)
AST: 32 U/L (ref 15–41)
Albumin: 3.8 g/dL (ref 3.5–5.0)
Alkaline Phosphatase: 59 U/L (ref 38–126)
Anion gap: 14 (ref 5–15)
BUN: 20 mg/dL (ref 8–23)
CO2: 21 mmol/L — ABNORMAL LOW (ref 22–32)
Calcium: 9.7 mg/dL (ref 8.9–10.3)
Chloride: 100 mmol/L (ref 98–111)
Creatinine, Ser: 0.67 mg/dL (ref 0.44–1.00)
GFR, Estimated: >60 mL/min (ref >60)
Glucose, Bld: 158 mg/dL — ABNORMAL HIGH (ref 70–99)
Potassium: 3.4 mmol/L — ABNORMAL LOW (ref 3.5–5.1)
Sodium: 135 mmol/L (ref 135–145)
Total Bilirubin: 1 mg/dL (ref 0.3–1.2)
Total Protein: 7.9 g/dL (ref 6.5–8.1)

## 2020-04-21 LAB — URINALYSIS, ROUTINE W REFLEX MICROSCOPIC
Bilirubin Urine: NEGATIVE
Glucose, UA: NEGATIVE mg/dL
Ketones, ur: 80 mg/dL — AB
Nitrite: POSITIVE — AB
Protein, ur: >=300 mg/dL — AB
Specific Gravity, Urine: 1.04 — ABNORMAL HIGH (ref 1.005–1.030)
WBC, UA: >50 WBC/hpf — ABNORMAL HIGH (ref 0–5)
pH: 5 (ref 5.0–8.0)

## 2020-04-21 LAB — RESP PANEL BY RT-PCR (FLU A&B, COVID) ARPGX2
Influenza A by PCR: NEGATIVE
Influenza B by PCR: NEGATIVE
SARS Coronavirus 2 by RT PCR: NEGATIVE

## 2020-04-21 LAB — PROTIME-INR
INR: 1.1 (ref 0.8–1.2)
Prothrombin Time: 13.3 seconds (ref 11.4–15.2)

## 2020-04-21 MED ORDER — ACETAMINOPHEN 500 MG PO TABS
1000.0000 mg | ORAL_TABLET | Freq: Once | ORAL | Status: AC
  Administered 2020-04-21: 1000 mg via ORAL
  Filled 2020-04-21: qty 2

## 2020-04-21 MED ORDER — DIAZEPAM 2 MG PO TABS: 2.0000 mg | ORAL_TABLET | Freq: Three times a day (TID) | ORAL | Status: DC | PRN

## 2020-04-21 MED ORDER — MORPHINE SULFATE (PF) 2 MG/ML IV SOLN
1.0000 mg | INTRAVENOUS | Status: DC | PRN
  Administered 2020-04-22: 1 mg via INTRAVENOUS
  Filled 2020-04-21: qty 1

## 2020-04-21 MED ORDER — DOCUSATE SODIUM 100 MG PO CAPS
100.0000 mg | ORAL_CAPSULE | Freq: Two times a day (BID) | ORAL | Status: DC
  Administered 2020-04-22 – 2020-04-29 (×12): 100 mg via ORAL
  Filled 2020-04-21 (×12): qty 1

## 2020-04-21 MED ORDER — METOPROLOL SUCCINATE ER 25 MG PO TB24
12.5000 mg | ORAL_TABLET | Freq: Every day | ORAL | Status: DC
  Administered 2020-04-22 – 2020-04-29 (×7): 12.5 mg via ORAL
  Filled 2020-04-21 (×5): qty 1
  Filled 2020-04-21: qty 0.5
  Filled 2020-04-21 (×3): qty 1

## 2020-04-21 MED ORDER — OXYCODONE HCL 5 MG PO TABS
5.0000 mg | ORAL_TABLET | Freq: Once | ORAL | Status: DC
  Filled 2020-04-21: qty 1

## 2020-04-21 MED ORDER — PANTOPRAZOLE SODIUM 40 MG PO TBEC
40.0000 mg | DELAYED_RELEASE_TABLET | Freq: Every day | ORAL | Status: DC
  Administered 2020-04-22 – 2020-04-29 (×7): 40 mg via ORAL
  Filled 2020-04-21 (×8): qty 1

## 2020-04-21 MED ORDER — HYDROCODONE-ACETAMINOPHEN 5-325 MG PO TABS: 1.0000 | ORAL_TABLET | Freq: Four times a day (QID) | ORAL | Status: DC | PRN

## 2020-04-21 MED ORDER — POTASSIUM CHLORIDE 20 MEQ PO PACK
40.0000 meq | PACK | Freq: Once | ORAL | Status: DC
  Filled 2020-04-21: qty 2

## 2020-04-21 MED ORDER — GABAPENTIN 100 MG PO CAPS
100.0000 mg | ORAL_CAPSULE | Freq: Every day | ORAL | Status: DC
  Administered 2020-04-22: 21:00:00 100 mg via ORAL
  Filled 2020-04-21: qty 1

## 2020-04-21 MED ORDER — HYDROCODONE-ACETAMINOPHEN 5-325 MG PO TABS
1.0000 | ORAL_TABLET | ORAL | Status: DC | PRN
  Administered 2020-04-22: 1 via ORAL
  Filled 2020-04-21 (×2): qty 1

## 2020-04-21 MED ORDER — SODIUM CHLORIDE 0.9 % IV SOLN: INTRAVENOUS | Status: DC

## 2020-04-21 MED ORDER — CHLORHEXIDINE GLUCONATE 4 % EX LIQD: 1.0000 "application " | Freq: Once | CUTANEOUS | Status: DC

## 2020-04-21 MED ORDER — BISACODYL 5 MG PO TBEC: 5.0000 mg | DELAYED_RELEASE_TABLET | Freq: Every day | ORAL | Status: DC | PRN

## 2020-04-21 MED ORDER — ENOXAPARIN SODIUM 40 MG/0.4ML ~~LOC~~ SOLN: 40.0000 mg | SUBCUTANEOUS | Status: DC

## 2020-04-21 MED ORDER — LIDOCAINE-EPINEPHRINE 2 %-1:100000 IJ SOLN
20.0000 mL | Freq: Once | INTRAMUSCULAR | Status: AC
  Administered 2020-04-21: 20 mL
  Filled 2020-04-21: qty 1

## 2020-04-21 MED ORDER — CLINDAMYCIN PHOSPHATE 600 MG/50ML IV SOLN
600.0000 mg | INTRAVENOUS | Status: AC
  Filled 2020-04-21: qty 50

## 2020-04-21 MED ORDER — CEFAZOLIN SODIUM-DEXTROSE 2-4 GM/100ML-% IV SOLN
2.0000 g | INTRAVENOUS | Status: AC
  Filled 2020-04-21: qty 100

## 2020-04-21 NOTE — ED Notes (Signed)
Updated [place] at [facility] at this time

## 2020-04-21 NOTE — ED Notes (Signed)
Pt to X-Ray at this time.

## 2020-04-21 NOTE — ED Notes (Addendum)
Purple man appeared for bed assignment. Messaged RN Marchelle Folks, and per [employer] bed was taken away and once transfers were completed upstairs, bed would be reassigned to this pt. RN Marchelle Folks messaged this RN again stating another nurse would be assigned to this pt and would contact this RN when bed is reassigned.

## 2020-04-21 NOTE — H&P (Addendum)
History and Physical    Christina Mooney ZOX:[id number] DOB: 19-Jul-1925 DOA: 04/21/2020  PCP: Marguarite Arbour, MD   Patient coming from: [facility]  I have personally briefly reviewed patient's old medical records in [facility]  Chief Complaint: Status post fall with left hip pain  HPI: Christina Mooney is a 86 y.o. female with medical history significant of dementia, GERD, carotid artery stenosis, hyperlipidemia, hypertension, fibromuscular dysplasia presented in the emergency department status post mechanical fall at the skilled nursing facility today.  Patient reports she tripped and fell on her left side.  She does not remember whether she lost consciousness or not.  She denies any dizziness.  She has Large laceration on the left parietal area.  She has developed severe left hip pain, she was not able to stand up.   ED Course: In the ED She was afebrile, vitals stable. Labs include: Sodium 135, potassium 3.4, chloride 100, bicarb 21, glucose 158, BUN 20, creatinine 0.67, calcium 9.7, and anion gap 14, alkaline phosphatase 59, AST 32, ALT 30, total bilirubin 1.0, WBC 11.1, hemoglobin 12.2, hematocrit 36.7, platelet 349, influenza negative, Covid negative. CT head: Marland Kitchen Laceration is seen involving the left parietal scalp near the vertex with overlying hematoma. 2. Mild diffuse cortical atrophy. Mild chronic ischemic white matter disease. No acute intracranial abnormality seen. CT hip left: There is a displaced intertrochanteric fracture of the left femur. There are additional fractures of the left pubic symphysis, which appear acute, and additional chronic fracture deformities of the bilateral pubic rami, which were present on prior radiographs. Partially imaged plate and screw fixation of the distal left femur. Osteopenia.   Review of Systems: As per HPI otherwise 10 point review of systems negative.   Review of Systems  Constitutional: Negative.   HENT: Negative.   Eyes:  Negative.   Respiratory: Negative.   Cardiovascular: Negative.   Gastrointestinal: Negative.   Genitourinary: Negative.   Musculoskeletal: Positive for back pain and falls.       Left hip pain.  Skin: Negative.   Neurological: Negative.   Endo/Heme/Allergies: Negative.   Psychiatric/Behavioral: Negative.     Past Medical History:  Diagnosis Date   Chronic back pain    Diverticulosis    GERD (gastroesophageal reflux disease)    Hyperlipidemia    Hypertension    Irritable bowel syndrome    Lumbago    Peripheral arterial disease (HCC)    Spinal stenosis of lumbar region with neurogenic claudication     Past Surgical History:  Procedure Laterality Date   APPENDECTOMY     BREAST BIOPSY Right 1980   neg   ORIF FEMUR FRACTURE Left 07/19/2019   Procedure: OPEN REDUCTION INTERNAL FIXATION (ORIF) DISTAL FEMUR FRACTURE;  Surgeon: Lyndle Herrlich, MD;  Location: [facility] ORS;  Service: Orthopedics;  Laterality: Left;     reports that she has never smoked. She has never used smokeless tobacco. She reports that she does not drink alcohol and does not use drugs.  Allergies  Allergen Reactions   Codeine Nausea And Vomiting   Garlic Other (See Comments)    indigestion   Milk-Related Compounds Other (See Comments)    Diarrhea/cramping   Sorbitol Other (See Comments)    Diarrhea/cramping    Family History  Problem Relation Age of Onset   Congestive Heart Failure Mother    Cerebrovascular Accident Father     Family history reviewed and not pertinent   Prior to Admission medications   Medication  Sig Start Date End Date Taking? Authorizing Provider  acetaminophen (TYLENOL) 500 MG tablet Take 1,000 mg by mouth 2 (two) times daily as needed.     [provider]  cholecalciferol (VITAMIN D3) 10 MCG (400 UNIT) TABS tablet Take 400 Units by mouth daily.     [provider]  clopidogrel (PLAVIX) 75 MG tablet TAKE 1 TABLET BY MOUTH DAILY 05/02/19    Schnier, Latina CraverGregory G, MD  diazepam (VALIUM) 2 MG tablet Take 1 tablet (2 mg total) by mouth every 8 (eight) hours as needed (dizziness). 07/21/19   Enedina FinnerPatel, Sona, MD  esomeprazole (NEXIUM) 40 MG capsule Take 40 mg by mouth daily.     [provider]  feeding supplement, ENSURE ENLIVE, (ENSURE ENLIVE) LIQD Take 237 mLs by mouth 2 (two) times daily between meals. 07/21/19   Enedina FinnerPatel, Sona, MD  gabapentin (NEURONTIN) 100 MG capsule Take 100 mg by mouth at bedtime.    [provider]  metoprolol succinate (TOPROL-XL) 25 MG 24 hr tablet Take 12.5 mg by mouth daily. 02/22/17   [provider]  Multiple Vitamin (MULTIVITAMIN WITH MINERALS) TABS tablet Take 1 tablet by mouth daily. 07/22/19   Enedina FinnerPatel, Sona, MD  polyethylene glycol (MIRALAX / GLYCOLAX) 17 g packet Take 17 g by mouth every other day.    [provider]  traMADol (ULTRAM) 50 MG tablet Take 1 tablet (50 mg total) by mouth every 6 (six) hours as needed for moderate pain. 07/21/19   Enedina FinnerPatel, Sona, MD    Physical Exam: Vitals:   04/21/20 1500 04/21/20 1530 04/21/20 1606 04/21/20 1630  BP: 120/62 (!) 132/59 108/76 (!) 115/58  Pulse: 95 86 93 93  Resp: 19 18 (!) 23 17  SpO2: 100% 99% 97% 97%  Weight:      Height:        Constitutional: NAD, calm, comfortable Vitals:   04/21/20 1500 04/21/20 1530 04/21/20 1606 04/21/20 1630  BP: 120/62 (!) 132/59 108/76 (!) 115/58  Pulse: 95 86 93 93  Resp: 19 18 (!) 23 17  SpO2: 100% 99% 97% 97%  Weight:      Height:       Eyes: PERRL, lids and conjunctivae normal ENMT: Mucous membranes are moist. Posterior pharynx clear of any exudate or lesions.Normal dentition.  Neck: normal, supple, no masses, no thyromegaly Respiratory: clear to auscultation bilaterally, no wheezing, no crackles. Normal respiratory effort. No accessory muscle use.  Cardiovascular: Regular rate and rhythm, no murmurs / rubs / gallops. No extremity edema. 2+ pedal pulses. No carotid bruits.  Abdomen: no  tenderness, no masses palpated. No hepatosplenomegaly. Bowel sounds positive.  Musculoskeletal: no clubbing / cyanosis. No joint deformity upper and lower extremities. Good ROM, no contractures. Normal muscle tone.  Skin: no rashes, lesions, ulcers. No induration Neurologic: CN 2-12 grossly intact. Sensation intact, DTR normal. Strength 5/5 in all 4.  Psychiatric: Normal judgment and insight. Alert and oriented x 3. Normal mood.  Extremities: Left hip tenderness noted,  left lower extremity externally rotated.   Labs on Admission: I have personally reviewed following labs and imaging studies  CBC: Recent Labs  Lab 04/21/20 1049  WBC 11.1*  NEUTROABS 10.0*  HGB 12.2  HCT 36.7  MCV 86.6  PLT 349   Basic Metabolic Panel: Recent Labs  Lab 04/21/20 1049  NA 135  K 3.4*  CL 100  CO2 21*  GLUCOSE 158*  BUN 20  CREATININE 0.67  CALCIUM 9.7   GFR: Estimated Creatinine Clearance: 29.3 mL/min (  by C-G formula based on SCr of 0.67 mg/dL). Liver Function Tests: Recent Labs  Lab 04/21/20 1049  AST 32  ALT 30  ALKPHOS 59  BILITOT 1.0  PROT 7.9  ALBUMIN 3.8   No results for input(s): LIPASE, AMYLASE in the last 168 hours. No results for input(s): AMMONIA in the last 168 hours. Coagulation Profile: Recent Labs  Lab 04/21/20 1049  INR 1.1   Cardiac Enzymes: No results for input(s): CKTOTAL, CKMB, CKMBINDEX, TROPONINI in the last 168 hours. BNP (last 3 results) No results for input(s): PROBNP in the last 8760 hours. HbA1C: No results for input(s): HGBA1C in the last 72 hours. CBG: No results for input(s): GLUCAP in the last 168 hours. Lipid Profile: No results for input(s): CHOL, HDL, LDLCALC, TRIG, CHOLHDL, LDLDIRECT in the last 72 hours. Thyroid Function Tests: No results for input(s): TSH, T4TOTAL, FREET4, T3FREE, THYROIDAB in the last 72 hours. Anemia Panel: No results for input(s): VITAMINB12, FOLATE, FERRITIN, TIBC, IRON, RETICCTPCT in the last 72 hours. Urine  analysis:    Component Value Date/Time   COLORURINE YELLOW (A) 07/18/2019 0922   APPEARANCEUR HAZY (A) 07/18/2019 0922   LABSPEC 1.018 07/18/2019 0922   PHURINE 7.0 07/18/2019 0922   GLUCOSEU NEGATIVE 07/18/2019 0922   HGBUR MODERATE (A) 07/18/2019 0922   BILIRUBINUR NEGATIVE 07/18/2019 0922   KETONESUR NEGATIVE 07/18/2019 0922   PROTEINUR 30 (A) 07/18/2019 0922   NITRITE POSITIVE (A) 07/18/2019 0922   LEUKOCYTESUR NEGATIVE 07/18/2019 0922    Radiological Exams on Admission: DG Chest 1 View  Result Date: 04/21/2020 CLINICAL DATA:  Dizziness, fall today. EXAM: CHEST  1 VIEW COMPARISON:  July 18, 2019. FINDINGS: Stable cardiomediastinal silhouette. No pneumothorax or pleural effusion is noted. Both lungs are clear. The visualized skeletal structures are unremarkable. IMPRESSION: No active disease. Electronically Signed   By: Lupita Raider M.D.   On: 04/21/2020 14:52   CT Head Wo Contrast  Result Date: 04/21/2020 CLINICAL DATA:  Head laceration after fall today. EXAM: CT HEAD WITHOUT CONTRAST TECHNIQUE: Contiguous axial images were obtained from the base of the skull through the vertex without intravenous contrast. COMPARISON:  July 18, 2019. FINDINGS: Brain: Mild diffuse cortical atrophy is noted. Mild chronic ischemic white matter disease is noted. No mass effect or midline shift is noted. Ventricular size is within normal limits. There is no evidence of mass lesion, hemorrhage or acute infarction. Vascular: No hyperdense vessel or unexpected calcification. Skull: Normal. Negative for fracture or focal lesion. Sinuses/Orbits: No acute finding. Other: Laceration is seen involving the left parietal scalp near the vertex with overlying hematoma. IMPRESSION: 1. Laceration is seen involving the left parietal scalp near the vertex with overlying hematoma. 2. Mild diffuse cortical atrophy. Mild chronic ischemic white matter disease. No acute intracranial abnormality seen. Electronically Signed    By: Lupita Raider M.D.   On: 04/21/2020 11:53   DG Hip Unilat W or Wo Pelvis 2-3 Views Left  Result Date: 04/21/2020 CLINICAL DATA:  Fall, left hip pain EXAM: DG HIP (WITH OR WITHOUT PELVIS) 2-3V LEFT COMPARISON:  07/18/2019 FINDINGS: Osteopenia. There is a displaced intertrochanteric fracture of the left femur. Partially imaged plate and screw fixation of the distal left femur. There are additional fractures of the left pubic symphysis, which appear acute, and additional chronic fracture deformities of the bilateral pubic rami, which were present on prior radiographs. The remaining pelvis and proximal right femur are unremarkable as included. IMPRESSION: 1. There is a displaced intertrochanteric fracture of  the left femur. 2. There are additional fractures of the left pubic symphysis, which appear acute, and additional chronic fracture deformities of the bilateral pubic rami, which were present on prior radiographs. 3. Partially imaged plate and screw fixation of the distal left femur. 4. Osteopenia. Electronically Signed   By: Lauralyn Primes M.D.   On: 04/21/2020 14:51    EKG: Independently reviewed.  Normal sinus rhythm no ST-T wave changes.  Assessment/Plan Principal Problem:   Closed left subtrochanteric femur fracture (HCC) Active Problems:   GERD (gastroesophageal reflux disease)   Carotid artery stenosis   Aortic atherosclerosis (HCC)   Hyperlipidemia   Essential hypertension, benign   Fibromuscular dysplasia (HCC)    S/p mechanical fall with left intertrochanteric femur fracture: Patient report mechanical fall, developed left hip pain. X-ray left hip consistent with displaced intertrochanteric fracture of left femur. There is also left pubic symphysis fractures. Admitted hip fracture pathway. Adequate pain control with oxycodone and morphine as needed. Orthopedic consulted,  recommended ORIF with left hip nailing. Will wait for anticoagulation to wash away. Tentative OR on  12/14. Will hold aspirin and Plavix for now.  Scalp laceration : It was stapled,  bleeding has stopped.  GERD: Continue esomeprazole.  Dementia: Patient is at baseline mental status.  Hypertension: Continue metoprolol 12.5 mg twice daily.  Carotid artery stenosis: We will hold Plavix in anticipation for hip surgery.   DVT prophylaxis: Lovenox Code Status: Full code. Family Communication:  No one at bed side. Disposition Plan:  SNF  Consults called: [facility] Dr. Hyacinth Meeker. Admission status: Inpatient.   Cipriano Bunker MD [facility]   If 7PM-7AM, please contact night-coverage [email or web address]   04/21/2020, 5:23 PM

## 2020-04-21 NOTE — ED Notes (Signed)
Pt to CT at this time.

## 2020-04-21 NOTE — ED Provider Notes (Signed)
..  Laceration Repair  Date/Time: 04/21/2020 2:38 PM Performed by: Racheal Patches, PA-C Authorized by: Racheal Patches, PA-C   Consent:    Consent obtained:  Verbal   Consent given by:  Patient   Risks discussed:  Pain and poor wound healing Universal protocol:    Patient identity confirmed:  Verbally with patient Anesthesia:    Anesthesia method:  Local infiltration   Local anesthetic:  Lidocaine 1% WITH epi Laceration details:    Location:  Scalp   Scalp location:  L parietal   Length (cm):  6 Exploration:    Hemostasis achieved with:  Epinephrine   Wound exploration: wound explored through full range of motion     Wound extent: vascular damage     Wound extent: no foreign bodies/material noted and no underlying fracture noted     Contaminated: no   Treatment:    Area cleansed with:  Saline   Amount of cleaning:  Extensive   Irrigation solution:  Sterile saline   Irrigation volume:  6 L   Irrigation method:  Syringe   Debridement:  Extensive Skin repair:    Repair method:  Staples   Number of staples:  12 Approximation:    Approximation:  Close Repair type:    Repair type:  Intermediate Post-procedure details:    Dressing:  Open (no dressing)   Procedure completion:  Tolerated Comments:     Patient presented with bleeding scalp wound.  Pressure dressing had been applied but had not fully controlled bleeding.  Patient had significant amount of blood and clot in her scalp.  This required extensive debridement of clotted blood.  6 L of saline plus hydrogen peroxide was used to break up clots.  Patient then had a comb ran through her hair to further remove clot so that I could finally assess and visualize patient's wound.  She has a 6 cm laceration that extends into the subcutaneous tissue of the scalp.  No visible skull identified.  Good bleeding cessation with lidocaine with epi.  12 staples placed with no complications.  Patient had no resumption of scalp  bleeding after staples are placed.      Racheal Patches, PA-C 04/21/20 1442    Delton Prairie, MD 04/21/20 1539    Delton Prairie, MD 04/21/20 [phone]

## 2020-04-21 NOTE — ED Notes (Signed)
Pt took PO tylenol with applesauce at this time.

## 2020-04-21 NOTE — ED Notes (Signed)
Pt son Emmagene Ortner updated at this time.

## 2020-04-21 NOTE — ED Notes (Signed)
Pt back from CT at this time 

## 2020-04-21 NOTE — ED Triage Notes (Signed)
Pt arrived via [facility] from [facility] with c/o fall. Per EMS, pt was dizzy this morning and fell out of wheelchair on way to bathroom.   Per EMS, head wound bled for 30 mins straight and were unable to determine if wound is lac or puncture.   Pt is on aspirin and plavix.   Per EMS, pt had hx of L leg/hip surgery so has known outward rotation of L leg but new L leg pain starting after fall.   Per EMS, pt pupils and grip strength equal. EMS VS are normal temp and CBG, 97% on 2L, 134/52, RR 12

## 2020-04-21 NOTE — ED Notes (Signed)
Lac cart at bedside at this time.

## 2020-04-21 NOTE — ED Notes (Addendum)
Pt gave verbal permission to this RN to update [place] her Charity fundraiser from [facility]. [place] updated at this time and informed that RN Jaclynn Guarneri is on phone with son at this time updating.  RN victoria also stated to this RN that the pt had not had a BM in couple of days and that they had informed their MD who would come and see pt on Monday. This RN will inform MD Katrinka Blazing of this new information.

## 2020-04-21 NOTE — ED Notes (Signed)
Pt placed in hospital gown, given warm blanket and pillow at this time. External cath placed at this time.

## 2020-04-21 NOTE — ED Notes (Signed)
EDP Christina Mooney made aware of [place] concerns of no bowel movement for the last few days. Also EDP Christina Mooney made aware that pt is becoming increasingly confused at this time. No new orders at this time, will continue to monitor pt condition for any changes.

## 2020-04-21 NOTE — Consult Note (Signed)
ORTHOPAEDIC CONSULTATION  REQUESTING PHYSICIAN: Delton Prairie, MD  Chief Complaint: Left hip pain  HPI: Christina Mooney is a 86 y.o. female who complains of left hip pain after a fall at her nursing home this morning.  She was brought to the emergency room where exam and x-rays reveal a comminuted displaced left intertrochanteric hip fracture.  The patient takes aspirin and Plavix and is anticoagulated.  She has been admitted for operative fixation and medical evaluation.  Surgery will be delayed 36 to 48 hours due to the Plavix.  Discussed surgery with the patient but will also discuss it with her family as well.  Past Medical History:  Diagnosis Date  . Chronic back pain   . Diverticulosis   . GERD (gastroesophageal reflux disease)   . Hyperlipidemia   . Hypertension   . Irritable bowel syndrome   . Lumbago   . Peripheral arterial disease (HCC)   . Spinal stenosis of lumbar region with neurogenic claudication    Past Surgical History:  Procedure Laterality Date  . APPENDECTOMY    . BREAST BIOPSY Right 1980   neg  . ORIF FEMUR FRACTURE Left 07/19/2019   Procedure: OPEN REDUCTION INTERNAL FIXATION (ORIF) DISTAL FEMUR FRACTURE;  Surgeon: Lyndle Herrlich, MD;  Location: [facility] ORS;  Service: Orthopedics;  Laterality: Left;   Social History   Socioeconomic History  . Marital status: Widowed    Spouse name: Not on file  . Number of children: Not on file  . Years of education: Not on file  . Highest education level: Not on file  Occupational History  . Not on file  Tobacco Use  . Smoking status: Never Smoker  . Smokeless tobacco: Never Used  Vaping Use  . Vaping Use: Never used  Substance and Sexual Activity  . Alcohol use: No  . Drug use: No  . Sexual activity: Never  Other Topics Concern  . Not on file  Social History Narrative  . Not on file   Social Determinants of Health   Financial Resource Strain: Not on file  Food Insecurity: Not on file  Transportation  Needs: Not on file  Physical Activity: Not on file  Stress: Not on file  Social Connections: Not on file   Family History  Problem Relation Age of Onset  . Congestive Heart Failure Mother   . Cerebrovascular Accident Father    Allergies  Allergen Reactions  . Codeine Nausea And Vomiting  . Garlic Other (See Comments)    indigestion  . Milk-Related Compounds Other (See Comments)    Diarrhea/cramping  . Sorbitol Other (See Comments)    Diarrhea/cramping   Prior to Admission medications   Medication Sig Start Date End Date Taking? Authorizing Provider  acetaminophen (TYLENOL) 500 MG tablet Take 1,000 mg by mouth 2 (two) times daily as needed.     [provider]  cholecalciferol (VITAMIN D3) 10 MCG (400 UNIT) TABS tablet Take 400 Units by mouth daily.     [provider]  clopidogrel (PLAVIX) 75 MG tablet TAKE 1 TABLET BY MOUTH DAILY 05/02/19   Schnier, Latina Craver, MD  diazepam (VALIUM) 2 MG tablet Take 1 tablet (2 mg total) by mouth every 8 (eight) hours as needed (dizziness). 07/21/19   Enedina Finner, MD  esomeprazole (NEXIUM) 40 MG capsule Take 40 mg by mouth daily.     [provider]  feeding supplement, ENSURE ENLIVE, (ENSURE ENLIVE) LIQD Take 237 mLs by mouth 2 (two) times daily between meals. 07/21/19  Enedina Finner, MD  gabapentin (NEURONTIN) 100 MG capsule Take 100 mg by mouth at bedtime.    [provider]  metoprolol succinate (TOPROL-XL) 25 MG 24 hr tablet Take 12.5 mg by mouth daily. 02/22/17   [provider]  Multiple Vitamin (MULTIVITAMIN WITH MINERALS) TABS tablet Take 1 tablet by mouth daily. 07/22/19   Enedina Finner, MD  polyethylene glycol (MIRALAX / GLYCOLAX) 17 g packet Take 17 g by mouth every other day.    [provider]  traMADol (ULTRAM) 50 MG tablet Take 1 tablet (50 mg total) by mouth every 6 (six) hours as needed for moderate pain. 07/21/19   Enedina Finner, MD   CT Head Wo Contrast  Result Date:  04/21/2020 CLINICAL DATA:  Head laceration after fall today. EXAM: CT HEAD WITHOUT CONTRAST TECHNIQUE: Contiguous axial images were obtained from the base of the skull through the vertex without intravenous contrast. COMPARISON:  July 18, 2019. FINDINGS: Brain: Mild diffuse cortical atrophy is noted. Mild chronic ischemic white matter disease is noted. No mass effect or midline shift is noted. Ventricular size is within normal limits. There is no evidence of mass lesion, hemorrhage or acute infarction. Vascular: No hyperdense vessel or unexpected calcification. Skull: Normal. Negative for fracture or focal lesion. Sinuses/Orbits: No acute finding. Other: Laceration is seen involving the left parietal scalp near the vertex with overlying hematoma. IMPRESSION: 1. Laceration is seen involving the left parietal scalp near the vertex with overlying hematoma. 2. Mild diffuse cortical atrophy. Mild chronic ischemic white matter disease. No acute intracranial abnormality seen. Electronically Signed   By: Lupita Raider M.D.   On: 04/21/2020 11:53    Positive ROS: All other systems have been reviewed and were otherwise negative with the exception of those mentioned in the HPI and as above.  Physical Exam: General: Alert, no acute distress Cardiovascular: No pedal edema Respiratory: No cyanosis, no use of accessory musculature GI: No organomegaly, abdomen is soft and non-tender Skin: No lesions in the area of chief complaint Neurologic: Sensation intact distally Psychiatric: Patient is competent for consent with normal mood and affect Lymphatic: No axillary or cervical lymphadenopathy  MUSCULOSKELETAL: The left leg is shortened and rotated.  There is pain with movement of the hip.  The skin is intact.  Neurovascular status is good distally.  Right leg is normal.  Upper extremities are normal and no other orthopedic injuries are noted.    Assessment: Displaced comminuted left intertrochanteric hip  fracture  Plan: Left hip nailing after waiting for anticoagulation to diminish .  Plan surgery for Tuesday morning.    Valinda Hoar, MD [phone]   04/21/2020 2:47 PM

## 2020-04-21 NOTE — ED Provider Notes (Signed)
[facility] Emergency Department Provider Note ____________________________________________   Event Date/Time   First MD Initiated Contact with Patient 04/21/20 1111     (approximate)  I have reviewed the triage vital signs and the nursing notes.  HISTORY  Chief Complaint Fall   HPI Christina Mooney is a 86 y.o. femalewho presents to the ED for evaluation of a fall.   Chart review indicates patient fell and fractured her left femur in March.  History of carotid stenosis on aspirin and Plavix.  HTN, GERD, dementia and patient resides at a local SNF, [place].  Patient reportedly felt dizzy this morning and fell out of her wheelchair on the way to the bathroom.  Patient fell forward and struck her head on the floor.  She is uncertain about a syncopal episode. EMS reports concern for significant scalp laceration they difficulty acquiring hemostasis in the field to her left parietal scalp. History limited due to disorientation of the patient.  Past Medical History:  Diagnosis Date  . Chronic back pain   . Diverticulosis   . GERD (gastroesophageal reflux disease)   . Hyperlipidemia   . Hypertension   . Irritable bowel syndrome   . Lumbago   . Peripheral arterial disease (HCC)   . Spinal stenosis of lumbar region with neurogenic claudication     Patient Active Problem List   Diagnosis Date Noted  . Protein-calorie malnutrition, severe 07/19/2019  . Femur fracture, left (HCC) 07/18/2019  . Closed fracture of left femur, unspecified fracture morphology, initial encounter (HCC) 07/18/2019  . Pneumothorax on left   . Pneumothorax, traumatic   . Fall 02/27/2019  . GERD (gastroesophageal reflux disease) 12/28/2015  . Carotid artery stenosis 12/28/2015  . Aortic atherosclerosis (HCC) 12/28/2015  . Hyperlipidemia 12/28/2015  . Essential hypertension, benign 12/28/2015  . Fibromuscular dysplasia (HCC) 12/28/2015    Past Surgical History:  Procedure  Laterality Date  . APPENDECTOMY    . BREAST BIOPSY Right 1980   neg  . ORIF FEMUR FRACTURE Left 07/19/2019   Procedure: OPEN REDUCTION INTERNAL FIXATION (ORIF) DISTAL FEMUR FRACTURE;  Surgeon: Lyndle Herrlich, MD;  Location: [facility] ORS;  Service: Orthopedics;  Laterality: Left;    Prior to Admission medications   Medication Sig Start Date End Date Taking? Authorizing Provider  acetaminophen (TYLENOL) 500 MG tablet Take 1,000 mg by mouth 2 (two) times daily as needed.     [provider]  cholecalciferol (VITAMIN D3) 10 MCG (400 UNIT) TABS tablet Take 400 Units by mouth daily.     [provider]  clopidogrel (PLAVIX) 75 MG tablet TAKE 1 TABLET BY MOUTH DAILY 05/02/19   Schnier, Latina Craver, MD  diazepam (VALIUM) 2 MG tablet Take 1 tablet (2 mg total) by mouth every 8 (eight) hours as needed (dizziness). 07/21/19   Enedina Finner, MD  esomeprazole (NEXIUM) 40 MG capsule Take 40 mg by mouth daily.     [provider]  feeding supplement, ENSURE ENLIVE, (ENSURE ENLIVE) LIQD Take 237 mLs by mouth 2 (two) times daily between meals. 07/21/19   Enedina Finner, MD  gabapentin (NEURONTIN) 100 MG capsule Take 100 mg by mouth at bedtime.    [provider]  metoprolol succinate (TOPROL-XL) 25 MG 24 hr tablet Take 12.5 mg by mouth daily. 02/22/17   [provider]  Multiple Vitamin (MULTIVITAMIN WITH MINERALS) TABS tablet Take 1 tablet by mouth daily. 07/22/19   Enedina Finner, MD  polyethylene glycol (MIRALAX / GLYCOLAX) 17 g packet  Take 17 g by mouth every other day.    [provider]  traMADol (ULTRAM) 50 MG tablet Take 1 tablet (50 mg total) by mouth every 6 (six) hours as needed for moderate pain. 07/21/19   Enedina Finner, MD    Allergies Codeine, Garlic, Milk-related compounds, and Sorbitol  Family History  Problem Relation Age of Onset  . Congestive Heart Failure Mother   . Cerebrovascular Accident Father     Social History Social History   Tobacco Use   . Smoking status: Never Smoker  . Smokeless tobacco: Never Used  Vaping Use  . Vaping Use: Never used  Substance Use Topics  . Alcohol use: No  . Drug use: No    Review of Systems  Unable to be accurately assessed due to patient's confusion and disorientation. ____________________________________________   PHYSICAL EXAM:  VITAL SIGNS: Vitals:   04/21/20 1300 04/21/20 1330  BP: (!) 124/48 (!) 128/98  Pulse: 79 87  Resp: (!) 21 (!) 21  SpO2: 96% 98%     Constitutional: Alert and pleasantly disoriented.  Follows commands in all 4 extremities.  Slightly hard of hearing. Eyes: Conjunctivae are normal. PERRL. EOMI. Head: Head wrapped with EMS gauze, saturating with blood to her left parietal scalp.  Removal of this reveals copious blood clot matting all of her hair.  Once cleaned up, lunate scalp laceration is noted. Repaired by Geanie Cooley, PA, as separately documented. Nose: No congestion/rhinnorhea. Mouth/Throat: Mucous membranes are moist.  Oropharynx non-erythematous. Neck: No stridor. No cervical spine tenderness to palpation. Cardiovascular: Normal rate, regular rhythm. Grossly normal heart sounds.  Good peripheral circulation. Respiratory: Normal respiratory effort.  No retractions. Lungs CTAB. Gastrointestinal: Soft , nondistended, nontender to palpation. No CVA tenderness. Musculoskeletal: Left hip pain to palpation and with logrolling of the LLE.  No discrete deformities or evidence of open injury.  LLE is distally neurovascularly intact.  Well-healed surgical incisions to her distal femur on the left. Neurologic:  Normal speech and language. No gross focal neurologic deficits are appreciated.  Skin:  Skin is warm, dry and intact. No rash noted. Psychiatric: Mood and affect are normal. Speech and behavior are normal.  ____________________________________________   LABS (all labs ordered are listed, but only abnormal results are displayed)  Labs Reviewed  CBC  WITH DIFFERENTIAL/PLATELET - Abnormal; Notable for the following components:      Result Value   WBC 11.1 (*)    Neutro Abs 10.0 (*)    Lymphs Abs 0.5 (*)    Abs Immature Granulocytes 0.15 (*)    All other components within normal limits  COMPREHENSIVE METABOLIC PANEL - Abnormal; Notable for the following components:   Potassium 3.4 (*)    CO2 21 (*)    Glucose, Bld 158 (*)    All other components within normal limits  RESP PANEL BY RT-PCR (FLU A&B, COVID) ARPGX2  PROTIME-INR  URINALYSIS, ROUTINE W REFLEX MICROSCOPIC  TYPE AND SCREEN   ____________________________________________  12 Lead EKG  Sinus rhythm, rate of 82 bpm.  Normal axis.  Normal intervals.  Wandering baseline.  No evidence of acute ischemia. ____________________________________________  RADIOLOGY  ED MD interpretation: CT head reviewed by me without evidence of acute intracranial pathology. Plain films of the pelvis and left hip reviewed by me with evidence of left-sided intertrochanteric femur fracture.  Official radiology report(s): CT Head Wo Contrast  Result Date: 04/21/2020 CLINICAL DATA:  Head laceration after fall today. EXAM: CT HEAD WITHOUT CONTRAST TECHNIQUE: Contiguous axial images were  obtained from the base of the skull through the vertex without intravenous contrast. COMPARISON:  July 18, 2019. FINDINGS: Brain: Mild diffuse cortical atrophy is noted. Mild chronic ischemic white matter disease is noted. No mass effect or midline shift is noted. Ventricular size is within normal limits. There is no evidence of mass lesion, hemorrhage or acute infarction. Vascular: No hyperdense vessel or unexpected calcification. Skull: Normal. Negative for fracture or focal lesion. Sinuses/Orbits: No acute finding. Other: Laceration is seen involving the left parietal scalp near the vertex with overlying hematoma. IMPRESSION: 1. Laceration is seen involving the left parietal scalp near the vertex with overlying hematoma.  2. Mild diffuse cortical atrophy. Mild chronic ischemic white matter disease. No acute intracranial abnormality seen. Electronically Signed   By: Lupita RaiderJames  Green Jr M.D.   On: 04/21/2020 11:53    ____________________________________________   PROCEDURES and INTERVENTIONS  Procedure(s) performed (including Critical Care):  .1-3 Lead EKG Interpretation Performed by: Delton PrairieSmith, Quincey Quesinberry, MD Authorized by: Delton PrairieSmith, Avantika Shere, MD     Interpretation: normal     ECG rate:  84   ECG rate assessment: normal     Rhythm: sinus rhythm     Ectopy: none     Conduction: normal      Medications  oxyCODONE (Oxy IR/ROXICODONE) immediate release tablet 5 mg (has no administration in time range)  0.9 %  sodium chloride infusion (has no administration in time range)  ceFAZolin (ANCEF) IVPB 2g/100 mL premix (has no administration in time range)  chlorhexidine (HIBICLENS) 4 % liquid 1 application (has no administration in time range)  clindamycin (CLEOCIN) IVPB 600 mg (has no administration in time range)  HYDROcodone-acetaminophen (NORCO/VICODIN) 5-325 MG per tablet 1 tablet (has no administration in time range)  morphine 2 MG/ML injection 1 mg (has no administration in time range)  lidocaine-EPINEPHrine (XYLOCAINE W/EPI) 2 %-1:100000 (with pres) injection 20 mL (20 mLs Infiltration Given 04/21/20 1341)  acetaminophen (TYLENOL) tablet 1,000 mg (1,000 mg Oral Given 04/21/20 1152)    ____________________________________________   MDM / ED COURSE   86 year old woman on DAPT presents after a fall with a scalp laceration and a broken left hip requiring medical admission for orthopedic fixation.  Normal vitals on room air.  Exam with bleeding scalp laceration requiring 12 staples for repair, as documented separately.  Also has tenderness to left hip with palpation of logrolling of the LLE, but is distally neurovascularly intact.  No further evidence of acute trauma on exam.  Blood work is unremarkable.  CT head shows no  ICH or cervical fracture.  Plain films of the pelvis and left hip shows intertrochanteric fracture.  Spoke with orthopedics who plans to fix the hip on Tuesday, due to her DAPT.  Admitted to hospitalist for further work-up and management.   Clinical Course as of 04/21/20 1444  Sun Apr 21, 2020  1230 PA Christiane HaJonathan helping with wound care and wound exploration [DS]  1321 Christiane HaJonathan finishes lac repair with 12 staples [DS]  1419 Educated patient on diagnosis of hip fracture [DS]  1425 Spoke with Dr. Hyacinth MeekerMiller, ortho on call,  He reviews xrays.  Plans to wait until Tuesday due to dapt.  [DS]    Clinical Course User Index [DS] Delton PrairieSmith, Rafeef Lau, MD    ____________________________________________   FINAL CLINICAL IMPRESSION(S) / ED DIAGNOSES  Final diagnoses:  Closed comminuted intertrochanteric fracture of left femur, initial encounter [facility](HCC)  Fall, initial encounter  Laceration of scalp, initial encounter  Injury of head, initial encounter  ED Discharge Orders    None       Starlee Corralejo Katrinka Blazing   Note:  This document was prepared using Dragon voice recognition software and may include unintentional dictation errors.   Delton Prairie, MD 04/21/20 [phone]

## 2020-04-21 NOTE — ED Notes (Signed)
Patient resting in stretcher, positioned on L side. No c/o pain, NAD noted.  Pt son bedside.

## 2020-04-21 NOTE — ED Notes (Signed)
Son. [name] updated at this time.

## 2020-04-21 NOTE — ED Notes (Signed)
Pt back from XRay at this time.

## 2020-04-22 DIAGNOSIS — Z01818 Encounter for other preprocedural examination: Secondary | ICD-10-CM

## 2020-04-22 DIAGNOSIS — S0101XS Laceration without foreign body of scalp, sequela: Secondary | ICD-10-CM

## 2020-04-22 DIAGNOSIS — I10 Essential (primary) hypertension: Secondary | ICD-10-CM

## 2020-04-22 DIAGNOSIS — K5900 Constipation, unspecified: Secondary | ICD-10-CM

## 2020-04-22 DIAGNOSIS — S7225XD Nondisplaced subtrochanteric fracture of left femur, subsequent encounter for closed fracture with routine healing: Secondary | ICD-10-CM

## 2020-04-22 DIAGNOSIS — N3001 Acute cystitis with hematuria: Secondary | ICD-10-CM

## 2020-04-22 DIAGNOSIS — S0101XA Laceration without foreign body of scalp, initial encounter: Secondary | ICD-10-CM

## 2020-04-22 LAB — MRSA PCR SCREENING: MRSA by PCR: NEGATIVE

## 2020-04-22 MED ORDER — ENSURE ENLIVE PO LIQD
237.0000 mL | Freq: Three times a day (TID) | ORAL | Status: DC
  Administered 2020-04-24 – 2020-04-29 (×10): 237 mL via ORAL

## 2020-04-22 MED ORDER — POLYETHYLENE GLYCOL 3350 17 G PO PACK
17.0000 g | PACK | Freq: Every day | ORAL | Status: DC
  Administered 2020-04-22 – 2020-04-29 (×7): 17 g via ORAL
  Filled 2020-04-22 (×7): qty 1

## 2020-04-22 MED ORDER — CEPHALEXIN 250 MG PO CAPS
250.0000 mg | ORAL_CAPSULE | Freq: Three times a day (TID) | ORAL | Status: DC
  Administered 2020-04-22: 21:00:00 250 mg via ORAL
  Filled 2020-04-22 (×6): qty 1

## 2020-04-22 MED ORDER — POTASSIUM CHLORIDE CRYS ER 10 MEQ PO TBCR
10.0000 meq | EXTENDED_RELEASE_TABLET | Freq: Two times a day (BID) | ORAL | Status: DC
  Administered 2020-04-22 (×2): 10 meq via ORAL
  Filled 2020-04-22 (×2): qty 1

## 2020-04-22 NOTE — Progress Notes (Signed)
Subjective:   Procedure(s) (LRB): INTRAMEDULLARY (IM) NAIL INTERTROCHANTRIC (Left)   Patient was comfortable overnight.  She awaits surgery tomorrow.  Hemoglobin stable.  Her son is present today and we discussed surgical treatment at length.  He wishes for [place] to proceed as planned.  Patient reports pain as mild.  Objective: The patient is sleeping..  Left leg remains shortened and rotated.  Neurovascular status good distally. VITALS:   Vitals:   04/22/20 0905 04/22/20 1328  BP: 120/67 135/71  Pulse: 81 92  Resp: 12   Temp: 98.3 F (36.8 C)   SpO2: 100%     Neurologically intact Sensation intact distally Dorsiflexion/Plantar flexion intact  LABS Recent Labs    04/21/20 1049  HGB 12.2  HCT 36.7  WBC 11.1*  PLT 349    Recent Labs    04/21/20 1049  NA 135  K 3.4*  BUN 20  CREATININE 0.67  GLUCOSE 158*    Recent Labs    04/21/20 1049  INR 1.1     Assessment/Plan:   Procedure(s) (LRB): INTRAMEDULLARY (IM) NAIL INTERTROCHANTRIC (Left)   Preop plans in place for surgery tomorrow.  Risks and benefits have been discussed with the patient and her son.

## 2020-04-22 NOTE — Progress Notes (Signed)
Patient ID: Christina Mooney, female   DOB: 03-30-1926, 86 y.o.   MRN: [id number] [facility] PROGRESS NOTE  Christina BarriosFrances S Mooney AOZ:[id number] DOB: 03-30-1926 DOA: 04/21/2020 PCP: Marguarite ArbourSparks, Jeffrey D, MD  HPI/Subjective: Patient is at [facility].  She had a fall and left hip pain and found to have a intertrochanteric left femur fracture.  Patient complains of soreness in her left leg.  No complaints of chest pain or shortness of breath.  Does not wear oxygen at home.  Patient is known the best historian about what happened with the fall.  According to the admitting physician she tripped and fell on her left side.  Objective: Vitals:   04/22/20 0800 04/22/20 0905  BP: 126/64 120/67  Pulse: 87 81  Resp:  12  Temp:  98.3 F (36.8 C)  SpO2: 100% 100%   No intake or output data in the 24 hours ending 04/22/20 1231 Filed Weights   04/21/20 1046  Weight: 41.3 kg    ROS: Review of Systems  Respiratory: Negative for shortness of breath.   Cardiovascular: Negative for chest pain.  Gastrointestinal: Positive for constipation. Negative for abdominal pain.  Genitourinary: Negative for dysuria.  Musculoskeletal: Positive for joint pain.   Exam: Physical Exam HENT:     Head: Normocephalic.     Mouth/Throat:     Pharynx: No oropharyngeal exudate.  Eyes:     General: Lids are normal.     Conjunctiva/sclera: Conjunctivae normal.  Cardiovascular:     Rate and Rhythm: Normal rate and regular rhythm.     Heart sounds: Normal heart sounds, S1 normal and S2 normal.  Pulmonary:     Breath sounds: No decreased breath sounds, wheezing, rhonchi or rales.  Abdominal:     Palpations: Abdomen is soft.     Tenderness: There is no abdominal tenderness.  Musculoskeletal:     Right lower leg: No swelling.     Left lower leg: No swelling.  Skin:    General: Skin is warm.     Findings: No rash.  Neurological:     Mental Status: She is alert.     Comments: Answers some yes/no questions but does  not elaborate.  Able to feel me touching her legs.  Able to wiggle toes bilaterally.       Data Reviewed: Basic Metabolic Panel: Recent Labs  Lab 04/21/20 1049  NA 135  K 3.4*  CL 100  CO2 21*  GLUCOSE 158*  BUN 20  CREATININE 0.67  CALCIUM 9.7   Liver Function Tests: Recent Labs  Lab 04/21/20 1049  AST 32  ALT 30  ALKPHOS 59  BILITOT 1.0  PROT 7.9  ALBUMIN 3.8   CBC: Recent Labs  Lab 04/21/20 1049  WBC 11.1*  NEUTROABS 10.0*  HGB 12.2  HCT 36.7  MCV 86.6  PLT 349     Recent Results (from the past 240 hour(s))  Resp Panel by RT-PCR (Flu A&B, Covid) Nasopharyngeal Swab     Status: None   Collection Time: 04/21/20  2:09 PM   Specimen: Nasopharyngeal Swab; Nasopharyngeal(NP) swabs in vial transport medium  Result Value Ref Range Status   SARS Coronavirus 2 by RT PCR NEGATIVE NEGATIVE Final    Comment: (NOTE) SARS-CoV-2 target nucleic acids are NOT DETECTED.  The SARS-CoV-2 RNA is generally detectable in upper respiratory specimens during the acute phase of infection. The lowest concentration of SARS-CoV-2 viral copies this assay can detect is 138 copies/mL. A negative result does not preclude SARS-Cov-2  infection and should not be used as the sole basis for treatment or other patient management decisions. A negative result may occur with  improper specimen collection/handling, submission of specimen other than nasopharyngeal swab, presence of viral mutation(s) within the areas targeted by this assay, and inadequate number of viral copies(<138 copies/mL). A negative result must be combined with clinical observations, patient history, and epidemiological information. The expected result is Negative.  Fact Sheet for Patients:  BloggerCourse.com  Fact Sheet for Healthcare Providers:  SeriousBroker.it  This test is no t yet approved or cleared by the [place] FDA and  has been authorized for  detection and/or diagnosis of SARS-CoV-2 by FDA under an Emergency Use Authorization (EUA). This EUA will remain  in effect (meaning this test can be used) for the duration of the COVID-19 declaration under Section 564(b)(1) of the Act, 21 U.S.C.section 360bbb-3(b)(1), unless the authorization is terminated  or revoked sooner.       Influenza A by PCR NEGATIVE NEGATIVE Final   Influenza B by PCR NEGATIVE NEGATIVE Final    Comment: (NOTE) The Xpert Xpress SARS-CoV-2/FLU/RSV plus assay is intended as an aid in the diagnosis of influenza from Nasopharyngeal swab specimens and should not be used as a sole basis for treatment. Nasal washings and aspirates are unacceptable for Xpert Xpress SARS-CoV-2/FLU/RSV testing.  Fact Sheet for Patients: BloggerCourse.com  Fact Sheet for Healthcare Providers: SeriousBroker.it  This test is not yet approved or cleared by the [place] FDA and has been authorized for detection and/or diagnosis of SARS-CoV-2 by FDA under an Emergency Use Authorization (EUA). This EUA will remain in effect (meaning this test can be used) for the duration of the COVID-19 declaration under Section 564(b)(1) of the Act, 21 U.S.C. section 360bbb-3(b)(1), unless the authorization is terminated or revoked.  Performed at [facility], [address] [place], [place] [address]   MRSA PCR Screening     Status: None   Collection Time: 04/22/20  9:03 AM   Specimen: Nasal Mucosa; Nasopharyngeal  Result Value Ref Range Status   MRSA by PCR NEGATIVE NEGATIVE Final    Comment:        The GeneXpert MRSA Assay (FDA approved for NASAL specimens only), is one component of a comprehensive MRSA colonization surveillance program. It is not intended to diagnose MRSA infection nor to guide or monitor treatment for MRSA infections. Performed at [facility], [address] [place], [place] [address]       Studies: DG Chest 1 View  Result Date: 04/21/2020 CLINICAL DATA:  Dizziness, fall today. EXAM: CHEST  1 VIEW COMPARISON:  July 18, 2019. FINDINGS: Stable cardiomediastinal silhouette. No pneumothorax or pleural effusion is noted. Both lungs are clear. The visualized skeletal structures are unremarkable. IMPRESSION: No active disease. Electronically Signed   By: Lupita Raider M.D.   On: 04/21/2020 14:52   CT Head Wo Contrast  Result Date: 04/21/2020 CLINICAL DATA:  Head laceration after fall today. EXAM: CT HEAD WITHOUT CONTRAST TECHNIQUE: Contiguous axial images were obtained from the base of the skull through the vertex without intravenous contrast. COMPARISON:  July 18, 2019. FINDINGS: Brain: Mild diffuse cortical atrophy is noted. Mild chronic ischemic white matter disease is noted. No mass effect or midline shift is noted. Ventricular size is within normal limits. There is no evidence of mass lesion, hemorrhage or acute infarction. Vascular: No hyperdense vessel or unexpected calcification. Skull: Normal. Negative for fracture or focal lesion. Sinuses/Orbits: No acute finding. Other: Laceration  is seen involving the left parietal scalp near the vertex with overlying hematoma. IMPRESSION: 1. Laceration is seen involving the left parietal scalp near the vertex with overlying hematoma. 2. Mild diffuse cortical atrophy. Mild chronic ischemic white matter disease. No acute intracranial abnormality seen. Electronically Signed   By: Lupita Raider M.D.   On: 04/21/2020 11:53   CT CERVICAL SPINE WO CONTRAST  Result Date: 04/21/2020 CLINICAL DATA:  Fall out of wheelchair on the way to the bathroom. EXAM: CT CERVICAL SPINE WITHOUT CONTRAST TECHNIQUE: Multidetector CT imaging of the cervical spine was performed without intravenous contrast. Multiplanar CT image reconstructions were also generated. COMPARISON:  CT 02/27/2019 FINDINGS: Alignment: Stable grade 1 anterolisthesis of C4 on C5. No traumatic  subluxation. Skull base and vertebrae: No acute fracture. The dens and skull base are intact. Degenerative flattening of the anterior arch of C1. Soft tissues and spinal canal: No prevertebral fluid or swelling. No visible canal hematoma. Disc levels: Disc space narrowing and endplate spurring A6-T0, C5-C6, and C6-C7. Multilevel facet hypertrophy. Degenerative changes are grossly stable from prior exam. Upper chest: No acute or unexpected findings. Other: None. IMPRESSION: Multilevel degenerative change in the cervical spine without acute fracture or subluxation. Electronically Signed   By: Narda Rutherford M.D.   On: 04/21/2020 18:29   DG Hip Unilat W or Wo Pelvis 2-3 Views Left  Result Date: 04/21/2020 CLINICAL DATA:  Fall, left hip pain EXAM: DG HIP (WITH OR WITHOUT PELVIS) 2-3V LEFT COMPARISON:  07/18/2019 FINDINGS: Osteopenia. There is a displaced intertrochanteric fracture of the left femur. Partially imaged plate and screw fixation of the distal left femur. There are additional fractures of the left pubic symphysis, which appear acute, and additional chronic fracture deformities of the bilateral pubic rami, which were present on prior radiographs. The remaining pelvis and proximal right femur are unremarkable as included. IMPRESSION: 1. There is a displaced intertrochanteric fracture of the left femur. 2. There are additional fractures of the left pubic symphysis, which appear acute, and additional chronic fracture deformities of the bilateral pubic rami, which were present on prior radiographs. 3. Partially imaged plate and screw fixation of the distal left femur. 4. Osteopenia. Electronically Signed   By: Lauralyn Primes M.D.   On: 04/21/2020 14:51    Scheduled Meds:  cephALEXin  250 mg Oral Q8H   chlorhexidine  1 application Topical Once   docusate sodium  100 mg Oral BID   gabapentin  100 mg Oral QHS   metoprolol succinate  12.5 mg Oral Daily   oxyCODONE  5 mg Oral Once   pantoprazole   40 mg Oral Daily   polyethylene glycol  17 g Oral Daily   potassium chloride  40 mEq Oral Once   Continuous Infusions:  sodium chloride 30 mL/hr at 04/22/20 1032    ceFAZolin (ANCEF) IV     clindamycin (CLEOCIN) IV      Assessment/Plan:  1. Preop evaluation for left intertrochanteric femur fracture.  No contraindications to surgery at this time.  Surgery must be done nonweightbearing bones in order to walk again.  If surgery not done mortality very high. 2. Left intertrochanteric femur fracture.  Patient to have repair in the operating room tomorrow.  Holding Plavix at this time. 3. Acute cystitis we will give Keflex 3 times daily for now.  Preop antibiotics will also cover.  Follow-up culture.  Short course of antibiotic. 4. Scalp laceration.  Stapled in the ER.  Keflex will also cover. 5. Essential  hypertension on metoprolol 6. Constipation start MiraLAX continue Colace 7. Neuropathy on gabapentin     Code Status:     Code Status Orders  (From admission, onward)         Start     Ordered   04/21/20 1643  Full code  Continuous        04/21/20 1648        Code Status History    Date Active Date Inactive Code Status Order ID Comments User Context   07/18/2019 1359 07/21/2019 1905 Full Code [id number]  Lucile Shutters, MD ED   02/27/2019 0902 03/02/2019 2239 Full Code [id number]  Enedina Finner, MD ED   Advance Care Planning Activity     Family Communication: Son at the bedside Disposition Plan: Status is: Inpatient  Dispo: The patient is from: [facility] rehab              Anticipated d/c is to: [facility] rehab              Anticipated d/c date is: 2 to 3 days postoperative which would be 04/25/2020 or 04/26/2020 depending on clinical course.              Patient currently will go to the operating room tomorrow for intertrochanteric femur fracture repair  Consultants:  Orthopedic surgery  Antibiotics:  Keflex  Time spent: 28 minutes  Birdella Sippel [employer]

## 2020-04-22 NOTE — Progress Notes (Signed)
5 lbs. [place] Traction was applied to the patient's left leg. She is A&O to person only and tolerated the application quite well. Her son [name] was at the bedside and educated about the need for [facility]. Patient states she is comfortable and has no pain.  Will instruct nursing to monitor for signs of pain/discomfort.

## 2020-04-22 NOTE — ED Notes (Signed)
Dry chux placed under patient, purewick in place. Patient repositioned on back.

## 2020-04-22 NOTE — ED Notes (Signed)
Son Christina Mooney called and message left on voicemail with pt's room assignment. That pt was taken to the inpatient unit.

## 2020-04-22 NOTE — ED Notes (Signed)
Pt cleaned up and clean brief and chux placed. Pt given pillow and warm blankets. Pt resting at this time.

## 2020-04-22 NOTE — TOC Initial Note (Signed)
Transition of Care [facility]) - Initial/Assessment Note    Patient Details  Name: Christina Mooney MRN: [id number] Date of Birth: 12/15/1925  Transition of Care [facility]) CM/SW Contact:    Magnus Ivan, LCSW Phone Number: 04/22/2020, 11:30 AM  Clinical Narrative:                CSW met with patient and son Legrand Como at bedside. Patient sleeping. Information obtained from son. Son reported patient is a long term resident at [facility] (since October 2020) and the plan is for him to discharge back there when medically ready. Sons live out of town. Patient uses a RW or w/c at SNF depending on the distance per son. SNF provides transportation and medications. CSW confirmed with Seth Bake at [facility] that patient is a resident and can return there at [place]. CSW will continue to follow.    Expected Discharge Plan: [place] Barriers to Discharge: Continued Medical Work up   Patient Goals and CMS Choice Patient states their goals for this hospitalization and ongoing recovery are:: to return to [facility] long term resident CMS Medicare.gov Compare Post Acute Care list provided to:: Patient Represenative (must comment) Choice offered to / list presented to : Adult Children  Expected Discharge Plan and Services Expected Discharge Plan: [place] arrangements for the past 2 months: [place]                                      Prior Living Arrangements/Services Living arrangements for the past 2 months: [place] Lives with:: Facility Resident Patient language and need for interpreter reviewed:: Yes Do you feel safe going back to the place where you live?: Yes      Need for Family Participation in Patient Care: Yes (Comment) Care giver support system in place?: Yes (comment) Current home services: DME Criminal Activity/Legal Involvement Pertinent to Current Situation/Hospitalization: No - Comment as  needed  Activities of Daily Living Home Assistive Devices/Equipment: Wheelchair,Raised toilet seat with rails ADL Screening (condition at time of admission) Patient's cognitive ability adequate to safely complete daily activities?: Yes Is the patient deaf or have difficulty hearing?: No Does the patient have difficulty seeing, even when wearing glasses/contacts?: No Does the patient have difficulty concentrating, remembering, or making decisions?: Yes Patient able to express need for assistance with ADLs?: Yes Does the patient have difficulty dressing or bathing?: Yes Independently performs ADLs?: No Communication: Independent Dressing (OT): Needs assistance Is this a change from baseline?: Pre-admission baseline Grooming: Needs assistance Is this a change from baseline?: Pre-admission baseline Feeding: Independent Bathing: Needs assistance Is this a change from baseline?: Pre-admission baseline Toileting: Needs assistance Is this a change from baseline?: Pre-admission baseline In/Out Bed: Needs assistance Is this a change from baseline?: Pre-admission baseline Walks in Home: Needs assistance Is this a change from baseline?: Pre-admission baseline Does the patient have difficulty walking or climbing stairs?: Yes Weakness of Legs: Both Weakness of Arms/Hands: Both  Permission Sought/Granted Permission sought to share information with : Facility Sport and exercise psychologist (by son Legrand Como) Permission granted to share information with : Yes, Verbal Permission Granted     Permission granted to share info w AGENCY: [place]        Emotional Assessment         Alcohol / Substance Use: Not Applicable Psych  Involvement: No (comment)  Admission diagnosis:  Closed left subtrochanteric femur fracture ([place]) [S72.22XA] Injury of head, initial encounter [S09.90XA] Fall, initial encounter [W19.XXXA] Laceration of scalp, initial encounter [S01.01XA] Closed  comminuted intertrochanteric fracture of left femur, initial encounter [facility]) [S72.142A] Patient Active Problem List   Diagnosis Date Noted  . Closed left subtrochanteric femur fracture ([place]) 04/21/2020  . Protein-calorie malnutrition, severe 07/19/2019  . Femur fracture, left ([place]) 07/18/2019  . Closed fracture of left femur, unspecified fracture morphology, initial encounter ([place]) 07/18/2019  . Pneumothorax on left   . Pneumothorax, traumatic   . Fall 02/27/2019  . GERD (gastroesophageal reflux disease) 12/28/2015  . Carotid artery stenosis 12/28/2015  . Aortic atherosclerosis ([place]) 12/28/2015  . Hyperlipidemia 12/28/2015  . Essential hypertension, benign 12/28/2015  . Fibromuscular dysplasia ([place]) 12/28/2015   PCP:  Idelle Crouch, MD Pharmacy:   [id number] [place]Phone: [phone]: [phone]    Social Determinants of Health (SDOH) Interventions    Readmission Risk Interventions No flowsheet data found.

## 2020-04-22 NOTE — ED Notes (Signed)
Spoke with pt's son Lorin Picket and updated him and provided hospital floor number.

## 2020-04-22 NOTE — Progress Notes (Signed)
Initial Nutrition Assessment  DOCUMENTATION CODES:   Severe malnutrition in context of social or environmental circumstances  INTERVENTION:  Provide Ensure Enlive po TID, each supplement provides 350 kcal and 20 grams of protein. Supplementation can be decreased as PO intake increases.  Monitor magnesium, potassium, and phosphorus daily for at least 3 days, MD to replete as needed, as pt is at risk for refeeding syndrome given severe malnutrition, recent poor PO intake related to constipation per son's report.  NUTRITION DIAGNOSIS:   Severe Malnutrition related to social / environmental circumstances (advanced age, suspected inadequate oral intake) as evidenced by severe fat depletion,severe muscle depletion.  GOAL:   Patient will meet greater than or equal to 90% of their needs  MONITOR:   PO intake,Supplement acceptance,Labs,Weight trends,Skin,I & O's  REASON FOR ASSESSMENT:   Consult Assessment of nutrition requirement/status  ASSESSMENT:   86 year old female with PMHx of IBS, HLD, HTN, PAD, GERD admitted after a fall with left intertrochanteric femur fracture.   Met with patient and her son at bedside. History provided by son as patient unable to provide any history. He reports patient resides at [facility]. Patient has had decreased appetite and intake for a while now but it has been worse lately due to constipation. Son reports patient was not able to eat anything so far today due to sleepiness. Patient now more awake. He has ordered her dinner that she will enjoy and is hopeful she will be able to eat some. He reports patient will likely also tolerate oral nutrition supplements. Patient is lactose-intolerant but discussed that Ensure Enlive is suitable for lactose intolerance. Discussed increased nutrient needs for post-operative healing. Plan is for surgery tomorrow.  Son reports patient's UBW is 80-90 lbs and that she had gained weight back to around 90 lbs. Per chart  patient was 37.2 kg on 07/18/2019 and is now documented to be 41.3 kg (91 lbs).  Medications reviewed and include: Colace 100 mg BID, Protonix, Miralax 17 grams daily, potassium chloride 10 mEq BID.  Labs reviewed: Potassium 3.4, CO2 21.  NUTRITION - FOCUSED PHYSICAL EXAM:  Flowsheet Row Most Recent Value  Orbital Region Severe depletion  Upper Arm Region Severe depletion  Thoracic and Lumbar Region Severe depletion  Buccal Region Moderate depletion  Temple Region Moderate depletion  Clavicle Bone Region Severe depletion  Clavicle and Acromion Bone Region Severe depletion  Scapular Bone Region Unable to assess  Dorsal Hand Severe depletion  Patellar Region Severe depletion  Anterior Thigh Region Severe depletion  Posterior Calf Region Severe depletion  Edema (RD Assessment) None  Hair Reviewed  Eyes Reviewed  Mouth Unable to assess  Skin Reviewed  Nails Reviewed     Diet Order:   Diet Order            DIET SOFT Room service appropriate? Yes; Fluid consistency: Thin  Diet effective now                EDUCATION NEEDS:   No education needs have been identified at this time  Skin:  Skin Assessment: Reviewed RN Assessment  Last BM:  Unknown  Height:   Ht Readings from Last 1 Encounters:  04/21/20 _0  (1.499 m)   Weight:   Wt Readings from Last 1 Encounters:  04/21/20 41.3 kg   BMI:  Body mass index is 18.38 kg/m.  Estimated Nutritional Needs:   Kcal:  1250-1450  Protein:  63-73 grams  Fluid:  1.2 L/day  Jacklynn Barnacle, MS, RD,  LDN Pager number available on Amion

## 2020-04-22 NOTE — ED Notes (Signed)
1A called and spoke with secretary and she confirmed room is clean and ready and so is RN Jun.

## 2020-04-22 NOTE — ED Notes (Signed)
Pt transported to 136-1A at this time.

## 2020-04-23 ENCOUNTER — Inpatient Hospital Stay: Payer: Medicare PPO | Admitting: Registered Nurse

## 2020-04-23 ENCOUNTER — Inpatient Hospital Stay: Payer: Medicare PPO

## 2020-04-23 ENCOUNTER — Encounter: Payer: Self-pay | Admitting: Family Medicine

## 2020-04-23 ENCOUNTER — Encounter
Admission: EM | Disposition: A | Discharge status: Skilled Nursing Facility | Payer: Self-pay | Source: Skilled Nursing Facility | Attending: Internal Medicine

## 2020-04-23 DIAGNOSIS — D509 Iron deficiency anemia, unspecified: Secondary | ICD-10-CM

## 2020-04-23 HISTORY — PX: INTRAMEDULLARY (IM) NAIL INTERTROCHANTERIC: SHX5875

## 2020-04-23 LAB — BASIC METABOLIC PANEL
Anion gap: 11 (ref 5–15)
BUN: 20 mg/dL (ref 8–23)
CO2: 22 mmol/L (ref 22–32)
Calcium: 8.7 mg/dL — ABNORMAL LOW (ref 8.9–10.3)
Chloride: 110 mmol/L (ref 98–111)
Creatinine, Ser: 0.49 mg/dL (ref 0.44–1.00)
GFR, Estimated: >60 mL/min (ref >60)
Glucose, Bld: 84 mg/dL (ref 70–99)
Potassium: 3.2 mmol/L — ABNORMAL LOW (ref 3.5–5.1)
Sodium: 143 mmol/L (ref 135–145)

## 2020-04-23 LAB — MAGNESIUM: Magnesium: 2.2 mg/dL (ref 1.7–2.4)

## 2020-04-23 LAB — CBC
HCT: 24.3 % — ABNORMAL LOW (ref 36.0–46.0)
Hemoglobin: 7.9 g/dL — ABNORMAL LOW (ref 12.0–15.0)
MCH: 28 pg (ref 26.0–34.0)
MCHC: 32.5 g/dL (ref 30.0–36.0)
MCV: 86.2 fL (ref 80.0–100.0)
Platelets: 254 10*3/uL (ref 150–400)
RBC: 2.82 MIL/uL — ABNORMAL LOW (ref 3.87–5.11)
RDW: 14.5 % (ref 11.5–15.5)
WBC: 11.2 10*3/uL — ABNORMAL HIGH (ref 4.0–10.5)
nRBC: 0 % (ref 0.0–0.2)

## 2020-04-23 LAB — IRON AND TIBC
Iron: 13 ug/dL — ABNORMAL LOW (ref 28–170)
Saturation Ratios: 6 % — ABNORMAL LOW (ref 10.4–31.8)
TIBC: 228 ug/dL — ABNORMAL LOW (ref 250–450)
UIBC: 215 ug/dL

## 2020-04-23 LAB — FERRITIN: Ferritin: 110 ng/mL (ref 11–307)

## 2020-04-23 LAB — PHOSPHORUS: Phosphorus: 2.2 mg/dL — ABNORMAL LOW (ref 2.5–4.6)

## 2020-04-23 SURGERY — FIXATION, FRACTURE, INTERTROCHANTERIC, WITH INTRAMEDULLARY ROD
Anesthesia: General | Laterality: Left

## 2020-04-23 MED ORDER — FLEET ENEMA 7-19 GM/118ML RE ENEM: 1.0000 | ENEMA | Freq: Once | RECTAL | Status: DC | PRN

## 2020-04-23 MED ORDER — MORPHINE SULFATE (PF) 2 MG/ML IV SOLN
0.5000 mg | INTRAVENOUS | Status: DC | PRN
Start: 2020-04-23 — End: 2020-04-29

## 2020-04-23 MED ORDER — FERROUS SULFATE 325 (65 FE) MG PO TABS
325.0000 mg | ORAL_TABLET | Freq: Every day | ORAL | Status: DC
  Administered 2020-04-24 – 2020-04-29 (×6): 325 mg via ORAL
  Filled 2020-04-23 (×6): qty 1

## 2020-04-23 MED ORDER — ONDANSETRON HCL 4 MG/2ML IJ SOLN
INTRAMUSCULAR | Status: DC | PRN
  Administered 2020-04-23: 4 mg via INTRAVENOUS

## 2020-04-23 MED ORDER — HYDROCODONE-ACETAMINOPHEN 5-325 MG PO TABS
1.0000 | ORAL_TABLET | ORAL | Status: DC | PRN
Start: 2020-04-23 — End: 2020-04-29

## 2020-04-23 MED ORDER — CEFAZOLIN SODIUM-DEXTROSE 2-4 GM/100ML-% IV SOLN
INTRAVENOUS | Status: AC
  Filled 2020-04-23: qty 100

## 2020-04-23 MED ORDER — METOCLOPRAMIDE HCL 5 MG/ML IJ SOLN: 5.0000 mg | Freq: Three times a day (TID) | INTRAMUSCULAR | Status: DC | PRN

## 2020-04-23 MED ORDER — EPHEDRINE SULFATE 50 MG/ML IJ SOLN
INTRAMUSCULAR | Status: DC | PRN
  Administered 2020-04-23 (×2): 5 mg via INTRAVENOUS

## 2020-04-23 MED ORDER — ZOLPIDEM TARTRATE 5 MG PO TABS: 5.0000 mg | ORAL_TABLET | Freq: Every evening | ORAL | Status: DC | PRN

## 2020-04-23 MED ORDER — METOCLOPRAMIDE HCL 10 MG PO TABS: 5.0000 mg | ORAL_TABLET | Freq: Three times a day (TID) | ORAL | Status: DC | PRN

## 2020-04-23 MED ORDER — PROPYLENE GLYCOL 0.6 % OP SOLN: 1.0000 [drp] | Freq: Three times a day (TID) | OPHTHALMIC | Status: DC | PRN

## 2020-04-23 MED ORDER — LIDOCAINE HCL (CARDIAC) PF 100 MG/5ML IV SOSY
PREFILLED_SYRINGE | INTRAVENOUS | Status: DC | PRN
  Administered 2020-04-23: 40 mg via INTRAVENOUS

## 2020-04-23 MED ORDER — FENTANYL CITRATE (PF) 100 MCG/2ML IJ SOLN
INTRAMUSCULAR | Status: AC
  Filled 2020-04-23: qty 2

## 2020-04-23 MED ORDER — ALUM & MAG HYDROXIDE-SIMETH 200-200-20 MG/5ML PO SUSP: 30.0000 mL | ORAL | Status: DC | PRN

## 2020-04-23 MED ORDER — SALINE SPRAY 0.65 % NA SOLN
1.0000 | NASAL | Status: DC | PRN
  Filled 2020-04-23: qty 44

## 2020-04-23 MED ORDER — PHENYLEPHRINE HCL (PRESSORS) 10 MG/ML IV SOLN
INTRAVENOUS | Status: DC | PRN
  Administered 2020-04-23 (×3): 100 ug via INTRAVENOUS

## 2020-04-23 MED ORDER — SODIUM CHLORIDE 0.9 % IR SOLN
Status: DC | PRN
  Administered 2020-04-23: 14:00:00 1000 mL

## 2020-04-23 MED ORDER — DEXAMETHASONE SODIUM PHOSPHATE 10 MG/ML IJ SOLN
INTRAMUSCULAR | Status: AC
  Filled 2020-04-23: qty 1

## 2020-04-23 MED ORDER — ONDANSETRON HCL 4 MG/2ML IJ SOLN: 4.0000 mg | Freq: Four times a day (QID) | INTRAMUSCULAR | Status: DC | PRN

## 2020-04-23 MED ORDER — CEFAZOLIN SODIUM-DEXTROSE 2-4 GM/100ML-% IV SOLN
2.0000 g | Freq: Three times a day (TID) | INTRAVENOUS | Status: DC
  Administered 2020-04-23 – 2020-04-24 (×2): 2 g via INTRAVENOUS
  Filled 2020-04-23 (×3): qty 100

## 2020-04-23 MED ORDER — ONDANSETRON HCL 4 MG PO TABS: 4.0000 mg | ORAL_TABLET | Freq: Four times a day (QID) | ORAL | Status: DC | PRN

## 2020-04-23 MED ORDER — ENOXAPARIN SODIUM 30 MG/0.3ML ~~LOC~~ SOLN
30.0000 mg | SUBCUTANEOUS | Status: DC
  Administered 2020-04-24: 10:00:00 30 mg via SUBCUTANEOUS
  Filled 2020-04-23: qty 0.3

## 2020-04-23 MED ORDER — ACETAMINOPHEN 325 MG PO TABS
325.0000 mg | ORAL_TABLET | Freq: Four times a day (QID) | ORAL | Status: DC | PRN
  Administered 2020-04-25: 650 mg via ORAL
  Filled 2020-04-23: qty 2

## 2020-04-23 MED ORDER — MENTHOL 3 MG MT LOZG
1.0000 | LOZENGE | OROMUCOSAL | Status: DC | PRN
  Filled 2020-04-23: qty 9

## 2020-04-23 MED ORDER — ARTIFICIAL TEARS OPHTHALMIC OINT
TOPICAL_OINTMENT | OPHTHALMIC | Status: AC
  Filled 2020-04-23: qty 3.5

## 2020-04-23 MED ORDER — LIDOCAINE HCL (PF) 2 % IJ SOLN
INTRAMUSCULAR | Status: AC
  Filled 2020-04-23: qty 5

## 2020-04-23 MED ORDER — PROPOFOL 10 MG/ML IV BOLUS
INTRAVENOUS | Status: DC | PRN
  Administered 2020-04-23: 60 mg via INTRAVENOUS

## 2020-04-23 MED ORDER — EPHEDRINE 5 MG/ML INJ
INTRAVENOUS | Status: AC
  Filled 2020-04-23: qty 10

## 2020-04-23 MED ORDER — CEFAZOLIN SODIUM-DEXTROSE 2-4 GM/100ML-% IV SOLN
2.0000 g | INTRAVENOUS | Status: AC
  Administered 2020-04-23: 13:00:00 1 g via INTRAVENOUS

## 2020-04-23 MED ORDER — SODIUM CHLORIDE 0.45 % IV SOLN: INTRAVENOUS | Status: DC

## 2020-04-23 MED ORDER — POLYETHYLENE GLYCOL 3350 17 G PO PACK: 17.0000 g | PACK | Freq: Every day | ORAL | Status: DC | PRN

## 2020-04-23 MED ORDER — BISMUTH SUBSALICYLATE 262 MG/15ML PO SUSP
30.0000 mL | ORAL | Status: DC | PRN
  Filled 2020-04-23: qty 118

## 2020-04-23 MED ORDER — CLINDAMYCIN PHOSPHATE 600 MG/50ML IV SOLN
600.0000 mg | Freq: Three times a day (TID) | INTRAVENOUS | Status: AC
  Administered 2020-04-23 – 2020-04-24 (×3): 600 mg via INTRAVENOUS
  Filled 2020-04-23 (×3): qty 50

## 2020-04-23 MED ORDER — DEXAMETHASONE SODIUM PHOSPHATE 10 MG/ML IJ SOLN
INTRAMUSCULAR | Status: DC | PRN
  Administered 2020-04-23: 8 mg via INTRAVENOUS

## 2020-04-23 MED ORDER — PROPOFOL 10 MG/ML IV BOLUS
INTRAVENOUS | Status: AC
  Filled 2020-04-23: qty 20

## 2020-04-23 MED ORDER — BUPIVACAINE HCL (PF) 0.5 % IJ SOLN
INTRAMUSCULAR | Status: DC | PRN
  Administered 2020-04-23: 30 mL

## 2020-04-23 MED ORDER — SUCCINYLCHOLINE CHLORIDE 20 MG/ML IJ SOLN
INTRAMUSCULAR | Status: DC | PRN
  Administered 2020-04-23: 60 mg via INTRAVENOUS

## 2020-04-23 MED ORDER — CLINDAMYCIN PHOSPHATE 600 MG/50ML IV SOLN
600.0000 mg | INTRAVENOUS | Status: AC
  Administered 2020-04-23: 13:00:00 600 mg via INTRAVENOUS

## 2020-04-23 MED ORDER — ROCURONIUM BROMIDE 100 MG/10ML IV SOLN
INTRAVENOUS | Status: DC | PRN
  Administered 2020-04-23: 5 mg via INTRAVENOUS
  Administered 2020-04-23: 20 mg via INTRAVENOUS

## 2020-04-23 MED ORDER — CLINDAMYCIN PHOSPHATE 600 MG/50ML IV SOLN
INTRAVENOUS | Status: AC
  Filled 2020-04-23: qty 50

## 2020-04-23 MED ORDER — MAGNESIUM HYDROXIDE 400 MG/5ML PO SUSP
30.0000 mL | Freq: Every day | ORAL | Status: DC | PRN
  Administered 2020-04-25: 09:00:00 30 mL via ORAL
  Filled 2020-04-23: qty 30

## 2020-04-23 MED ORDER — SUGAMMADEX SODIUM 500 MG/5ML IV SOLN
INTRAVENOUS | Status: AC
  Filled 2020-04-23: qty 5

## 2020-04-23 MED ORDER — PHENOL 1.4 % MT LIQD
1.0000 | OROMUCOSAL | Status: DC | PRN
  Filled 2020-04-23: qty 177

## 2020-04-23 MED ORDER — SUGAMMADEX SODIUM 200 MG/2ML IV SOLN
INTRAVENOUS | Status: DC | PRN
  Administered 2020-04-23: 200 mg via INTRAVENOUS

## 2020-04-23 MED ORDER — FENTANYL CITRATE (PF) 100 MCG/2ML IJ SOLN: 25.0000 ug | INTRAMUSCULAR | Status: DC | PRN

## 2020-04-23 MED ORDER — FENTANYL CITRATE (PF) 100 MCG/2ML IJ SOLN
INTRAMUSCULAR | Status: DC | PRN
  Administered 2020-04-23 (×2): 50 ug via INTRAVENOUS

## 2020-04-23 MED ORDER — BISACODYL 10 MG RE SUPP
10.0000 mg | Freq: Every day | RECTAL | Status: DC | PRN
  Administered 2020-04-25: 15:00:00 10 mg via RECTAL
  Filled 2020-04-23: qty 1

## 2020-04-23 MED ORDER — HYPROMELLOSE (GONIOSCOPIC) 2.5 % OP SOLN
1.0000 [drp] | Freq: Every day | OPHTHALMIC | Status: DC | PRN
  Filled 2020-04-23: qty 15

## 2020-04-23 MED ORDER — SENNA 8.6 MG PO TABS
1.0000 | ORAL_TABLET | Freq: Two times a day (BID) | ORAL | Status: DC
  Administered 2020-04-23 – 2020-04-29 (×11): 8.6 mg via ORAL
  Filled 2020-04-23 (×12): qty 1

## 2020-04-23 MED ORDER — ACETAMINOPHEN 325 MG PO TABS
650.0000 mg | ORAL_TABLET | Freq: Three times a day (TID) | ORAL | Status: DC
  Administered 2020-04-23 – 2020-04-29 (×17): 650 mg via ORAL
  Filled 2020-04-23 (×17): qty 2

## 2020-04-23 MED ORDER — ONDANSETRON HCL 4 MG/2ML IJ SOLN
INTRAMUSCULAR | Status: AC
  Filled 2020-04-23: qty 2

## 2020-04-23 SURGICAL SUPPLY — 46 items
BIT DRILL CALIBRATED 4.2 (BIT) IMPLANT
BLADE TFNA HELICAL 90 STERILE (Anchor) ×2 IMPLANT
BNDG COHESIVE 4X5 TAN STRL (GAUZE/BANDAGES/DRESSINGS) ×5 IMPLANT
BNDG GAUZE 4.5X4.1 6PLY STRL (MISCELLANEOUS) ×2 IMPLANT
CANISTER SUCT 1200ML W/VALVE (MISCELLANEOUS) ×3 IMPLANT
CHLORAPREP W/TINT 26 (MISCELLANEOUS) ×6 IMPLANT
COVER WAND RF STERILE (DRAPES) ×3 IMPLANT
DRAPE C-ARMOR (DRAPES) IMPLANT
DRAPE INCISE 23X17 IOBAN STRL (DRAPES) ×2
DRAPE INCISE 23X17 STRL (DRAPES) ×1 IMPLANT
DRAPE INCISE IOBAN 23X17 STRL (DRAPES) ×1 IMPLANT
DRILL BIT CALIBRATED 4.2 (BIT) ×3
DRSG AQUACEL AG ADV 3.5X10 (GAUZE/BANDAGES/DRESSINGS) IMPLANT
DRSG AQUACEL AG ADV 3.5X14 (GAUZE/BANDAGES/DRESSINGS) ×2 IMPLANT
ELECT REM PT RETURN 9FT ADLT (ELECTROSURGICAL) ×3
ELECTRODE REM PT RTRN 9FT ADLT (ELECTROSURGICAL) ×1 IMPLANT
GAUZE SPONGE 4X4 12PLY STRL (GAUZE/BANDAGES/DRESSINGS) ×5 IMPLANT
GAUZE XEROFORM 1X8 LF (GAUZE/BANDAGES/DRESSINGS) IMPLANT
GLOVE INDICATOR 8.0 STRL GRN (GLOVE) ×3 IMPLANT
GLOVE SURG ORTHO 8.5 STRL (GLOVE) ×3 IMPLANT
GOWN STRL REUS W/ TWL LRG LVL3 (GOWN DISPOSABLE) ×1 IMPLANT
GOWN STRL REUS W/TWL LRG LVL3 (GOWN DISPOSABLE) ×2
GOWN STRL REUS W/TWL LRG LVL4 (GOWN DISPOSABLE) ×3 IMPLANT
GUIDEWIRE 3.2X400 (WIRE) ×4 IMPLANT
KIT TURNOVER KIT A (KITS) ×3 IMPLANT
MANIFOLD NEPTUNE II (INSTRUMENTS) ×3 IMPLANT
MAT ABSORB  FLUID 56X50 GRAY (MISCELLANEOUS) ×2
MAT ABSORB FLUID 56X50 GRAY (MISCELLANEOUS) ×1 IMPLANT
NAIL TROCH FIX 10X170 130 (Nail) ×2 IMPLANT
NDL HPO THNWL 1X22GA REG BVL (NEEDLE) IMPLANT
NDL SPNL 18GX3.5 QUINCKE PK (NEEDLE) ×1 IMPLANT
NEEDLE HYPO 22GX1.5 SAFETY (NEEDLE) ×2 IMPLANT
NEEDLE SAFETY 22GX1 (NEEDLE) ×2
NEEDLE SPNL 18GX3.5 QUINCKE PK (NEEDLE) ×3 IMPLANT
NS IRRIG 500ML POUR BTL (IV SOLUTION) ×3 IMPLANT
PACK HIP COMPR (MISCELLANEOUS) ×3 IMPLANT
SCREW LOCKING 5.0X32MM (Screw) ×2 IMPLANT
SOL PREP PVP 2OZ (MISCELLANEOUS) ×3
SOLUTION PREP PVP 2OZ (MISCELLANEOUS) ×1 IMPLANT
STAPLER SKIN PROX 35W (STAPLE) ×3 IMPLANT
SUCTION FRAZIER HANDLE 10FR (MISCELLANEOUS) ×2
SUCTION TUBE FRAZIER 10FR DISP (MISCELLANEOUS) ×1 IMPLANT
SUT VIC AB 0 CT1 36 (SUTURE) ×3 IMPLANT
SUT VIC AB 2-0 CT1 27 (SUTURE) ×2
SUT VIC AB 2-0 CT1 TAPERPNT 27 (SUTURE) ×1 IMPLANT
SYR 30ML LL (SYRINGE) ×3 IMPLANT

## 2020-04-23 NOTE — Op Note (Signed)
DATE OF SURGERY:  04/23/2020  TIME: 2:34 PM  PATIENT NAME:  Christina Mooney  AGE: 86 y.o.  PRE-OPERATIVE DIAGNOSIS:  Left Hip displaced intertrochanteric  Fracture  POST-OPERATIVE DIAGNOSIS:  SAME  PROCEDURE:  INTRAMEDULLARY (IM) NAIL INTERTROCHANTRIC left hip  SURGEON:  Valinda Hoar  ASST:  EBL:  50 cc  COMPLICATIONS:  None  OPERATIVE IMPLANTS: Synthes trochanteric femoral nail  130*/47mm  with interlocking helical blade  90 mm and distal locking screw  32 mm.  PREOPERATIVE INDICATIONS:  Christina Mooney is a 86 y.o. year old who fell and suffered a hip fracture. She was brought into the ER and then admitted and optimized and then elected for surgical intervention.    The risks benefits and alternatives were discussed with the patient including but not limited to the risks of nonoperative treatment, versus surgical intervention including infection, bleeding, nerve injury, malunion, nonunion, hardware prominence, hardware failure, need for hardware removal, blood clots, cardiopulmonary complications, morbidity, mortality, among others, and they were willing to proceed.    OPERATIVE PROCEDURE:  The patient was brought to the operating room and placed in the supine position.  General anesthesia was administered, with a foley. She was placed on the fracture table.  Closed reduction was performed under C-arm guidance. The length of the femur was also measured using fluoroscopy. Time out was then performed after sterile prep and drape. She received preoperative antibiotics.  Incision was made proximal to the greater trochanter. A guidewire was placed in the appropriate position. Confirmation was made on AP and lateral views. The above-named nail was opened. I opened the proximal femur with a reamer. I then placed the nail by hand easily down. I did not need to ream the femur.  Once the nail was completely seated, I placed a guidepin into the femoral head into the center center  position through a second incision.  I measured the length, and then reamed the lateral cortex and up into the head. I then placed the helical blade. Slight compression was applied. Anatomic fixation achieved. Bone quality was mediocre.  I then secured the proximal interlock.  The distal locking screw was then placed and after confirming the position of the fracture fragments and hardware I then removed the instruments, and took final C-arm pictures AP and lateral the entire length of the leg. Anatomic reconstruction was achieved, and the wounds were irrigated copiously and closed with Vicryl  followed by staples and dry sterile dressing. Sponge and needle count were correct.   The patient was awakened and returned to PACU in stable and satisfactory condition. There no complications and the patient tolerated the procedure well.  She will be partial weightbearing as tolerated, and will be on Lovenox  For DVT prophylaxis.     Valinda Hoar, M.D.

## 2020-04-23 NOTE — Progress Notes (Signed)
Patient ID: Christina Mooney, female   DOB: August 10, 1925, 86 y.o.   MRN: [id number] [facility] PROGRESS NOTE  Christina Mooney:[id number] DOB: 06/13/1925 DOA: 04/21/2020 PCP: Marguarite Arbour, MD  HPI/Subjective: Patient seen this morning prior to surgery.  Having some pain in her left hip.  She states that she slept okay.  Denies any bleeding.  Objective: Vitals:   04/23/20 0434 04/23/20 0843  BP: (!) 126/44 (!) 124/59  Pulse: 78 84  Resp: 16 16  Temp: 98.8 F (37.1 C) 97.8 F (36.6 C)  SpO2: 100% 91%    Intake/Output Summary (Last 24 hours) at 04/23/2020 1230 Last data filed at 04/23/2020 0438 Gross per 24 hour  Intake 1709 ml  Output 400 ml  Net 1309 ml   Filed Weights   04/21/20 1046  Weight: 41.3 kg    ROS: Review of Systems  Respiratory: Negative for cough and shortness of breath.   Cardiovascular: Negative for chest pain.  Gastrointestinal: Negative for abdominal pain, nausea and vomiting.   Exam: Physical Exam HENT:     Head: Normocephalic.     Mouth/Throat:     Pharynx: No oropharyngeal exudate.  Eyes:     General: Lids are normal.     Conjunctiva/sclera: Conjunctivae normal.  Cardiovascular:     Rate and Rhythm: Normal rate and regular rhythm.     Heart sounds: Normal heart sounds, S1 normal and S2 normal.  Pulmonary:     Breath sounds: Normal breath sounds. No decreased breath sounds, wheezing, rhonchi or rales.  Abdominal:     Palpations: Abdomen is soft.     Tenderness: There is no abdominal tenderness.  Musculoskeletal:     Right lower leg: No swelling.     Left lower leg: No swelling.  Skin:    General: Skin is warm.     Findings: No rash.     Comments: Some bruising over her arms.  Neurological:     Mental Status: She is alert.     Comments: Answers some questions.       Data Reviewed: Basic Metabolic Panel: Recent Labs  Lab 04/21/20 1049 04/23/20 0348  NA 135 143  K 3.4* 3.2*  CL 100 110  CO2 21* 22  GLUCOSE 158*  84  BUN 20 20  CREATININE 0.67 0.49  CALCIUM 9.7 8.7*  MG  --  2.2  PHOS  --  2.2*   Liver Function Tests: Recent Labs  Lab 04/21/20 1049  AST 32  ALT 30  ALKPHOS 59  BILITOT 1.0  PROT 7.9  ALBUMIN 3.8   CBC: Recent Labs  Lab 04/21/20 1049 04/23/20 0348  WBC 11.1* 11.2*  NEUTROABS 10.0*  --   HGB 12.2 7.9*  HCT 36.7 24.3*  MCV 86.6 86.2  PLT 349 254     Recent Results (from the past 240 hour(s))  Resp Panel by RT-PCR (Flu A&B, Covid) Nasopharyngeal Swab     Status: None   Collection Time: 04/21/20  2:09 PM   Specimen: Nasopharyngeal Swab; Nasopharyngeal(NP) swabs in vial transport medium  Result Value Ref Range Status   SARS Coronavirus 2 by RT PCR NEGATIVE NEGATIVE Final    Comment: (NOTE) SARS-CoV-2 target nucleic acids are NOT DETECTED.  The SARS-CoV-2 RNA is generally detectable in upper respiratory specimens during the acute phase of infection. The lowest concentration of SARS-CoV-2 viral copies this assay can detect is 138 copies/mL. A negative result does not preclude SARS-Cov-2 infection and should not be used as  the sole basis for treatment or other patient management decisions. A negative result may occur with  improper specimen collection/handling, submission of specimen other than nasopharyngeal swab, presence of viral mutation(s) within the areas targeted by this assay, and inadequate number of viral copies(<138 copies/mL). A negative result must be combined with clinical observations, patient history, and epidemiological information. The expected result is Negative.  Fact Sheet for Patients:  BloggerCourse.com  Fact Sheet for Healthcare Providers:  SeriousBroker.it  This test is no t yet approved or cleared by the [place] FDA and  has been authorized for detection and/or diagnosis of SARS-CoV-2 by FDA under an Emergency Use Authorization (EUA). This EUA will remain  in effect (meaning  this test can be used) for the duration of the COVID-19 declaration under Section 564(b)(1) of the Act, 21 U.S.C.section 360bbb-3(b)(1), unless the authorization is terminated  or revoked sooner.       Influenza A by PCR NEGATIVE NEGATIVE Final   Influenza B by PCR NEGATIVE NEGATIVE Final    Comment: (NOTE) The Xpert Xpress SARS-CoV-2/FLU/RSV plus assay is intended as an aid in the diagnosis of influenza from Nasopharyngeal swab specimens and should not be used as a sole basis for treatment. Nasal washings and aspirates are unacceptable for Xpert Xpress SARS-CoV-2/FLU/RSV testing.  Fact Sheet for Patients: BloggerCourse.com  Fact Sheet for Healthcare Providers: SeriousBroker.it  This test is not yet approved or cleared by the [place] FDA and has been authorized for detection and/or diagnosis of SARS-CoV-2 by FDA under an Emergency Use Authorization (EUA). This EUA will remain in effect (meaning this test can be used) for the duration of the COVID-19 declaration under Section 564(b)(1) of the Act, 21 U.S.C. section 360bbb-3(b)(1), unless the authorization is terminated or revoked.  Performed at [facility], [address], [place], [place] [address]   Urine Culture     Status: Abnormal (Preliminary result)   Collection Time: 04/21/20  8:16 PM   Specimen: Urine, Catheterized  Result Value Ref Range Status   Specimen Description   Final    URINE, CATHETERIZED Performed at [facility], [address], [place], [place] [address]    Special Requests   Final    NONE Performed at [facility], [address] [place], [place] [address]    Culture (A)  Final    >=100,000 COLONIES/mL ESCHERICHIA COLI SUSCEPTIBILITIES TO FOLLOW Performed at [facility], [address], [place], [place] [address]    Report Status PENDING  Incomplete  MRSA PCR Screening     Status: None   Collection  Time: 04/22/20  9:03 AM   Specimen: Nasal Mucosa; Nasopharyngeal  Result Value Ref Range Status   MRSA by PCR NEGATIVE NEGATIVE Final    Comment:        The GeneXpert MRSA Assay (FDA approved for NASAL specimens only), is one component of a comprehensive MRSA colonization surveillance program. It is not intended to diagnose MRSA infection nor to guide or monitor treatment for MRSA infections. Performed at [name], [address], [place], [place] [address]      Studies: DG Chest 1 View  Result Date: 04/21/2020 CLINICAL DATA:  Dizziness, fall today. EXAM: CHEST  1 VIEW COMPARISON:  July 18, 2019. FINDINGS: Stable cardiomediastinal silhouette. No pneumothorax or pleural effusion is noted. Both lungs are clear. The visualized skeletal structures are unremarkable. IMPRESSION: No active disease. Electronically Signed   By: Lupita Raider M.D.   On: 04/21/2020  14:52   CT CERVICAL SPINE WO CONTRAST  Result Date: 04/21/2020 CLINICAL DATA:  Fall out of wheelchair on the way to the bathroom. EXAM: CT CERVICAL SPINE WITHOUT CONTRAST TECHNIQUE: Multidetector CT imaging of the cervical spine was performed without intravenous contrast. Multiplanar CT image reconstructions were also generated. COMPARISON:  CT 02/27/2019 FINDINGS: Alignment: Stable grade 1 anterolisthesis of C4 on C5. No traumatic subluxation. Skull base and vertebrae: No acute fracture. The dens and skull base are intact. Degenerative flattening of the anterior arch of C1. Soft tissues and spinal canal: No prevertebral fluid or swelling. No visible canal hematoma. Disc levels: Disc space narrowing and endplate spurring W2-B7, C5-C6, and C6-C7. Multilevel facet hypertrophy. Degenerative changes are grossly stable from prior exam. Upper chest: No acute or unexpected findings. Other: None. IMPRESSION: Multilevel degenerative change in the cervical spine without acute fracture or subluxation. Electronically Signed   By:  Narda Rutherford M.D.   On: 04/21/2020 18:29   DG Hip Unilat W or Wo Pelvis 2-3 Views Left  Result Date: 04/21/2020 CLINICAL DATA:  Fall, left hip pain EXAM: DG HIP (WITH OR WITHOUT PELVIS) 2-3V LEFT COMPARISON:  07/18/2019 FINDINGS: Osteopenia. There is a displaced intertrochanteric fracture of the left femur. Partially imaged plate and screw fixation of the distal left femur. There are additional fractures of the left pubic symphysis, which appear acute, and additional chronic fracture deformities of the bilateral pubic rami, which were present on prior radiographs. The remaining pelvis and proximal right femur are unremarkable as included. IMPRESSION: 1. There is a displaced intertrochanteric fracture of the left femur. 2. There are additional fractures of the left pubic symphysis, which appear acute, and additional chronic fracture deformities of the bilateral pubic rami, which were present on prior radiographs. 3. Partially imaged plate and screw fixation of the distal left femur. 4. Osteopenia. Electronically Signed   By: Lauralyn Primes M.D.   On: 04/21/2020 14:51    Scheduled Meds: . [MAR Hold] cephALEXin  250 mg Oral Q8H  . [MAR Hold] chlorhexidine  1 application Topical Once  . [MAR Hold] docusate sodium  100 mg Oral BID  . [MAR Hold] feeding supplement  237 mL Oral TID BM  . [MAR Hold] gabapentin  100 mg Oral QHS  . [MAR Hold] metoprolol succinate  12.5 mg Oral Daily  . [MAR Hold] pantoprazole  40 mg Oral Daily  . [MAR Hold] polyethylene glycol  17 g Oral Daily  . [MAR Hold] potassium chloride  10 mEq Oral BID   Continuous Infusions: . sodium chloride 30 mL/hr at 04/22/20 1032  . [START ON 04/24/2020]  ceFAZolin (ANCEF) IV    . [START ON 04/24/2020] clindamycin (CLEOCIN) IV      Assessment/Plan:  1.  Left intertrochanteric femur fracture requiring operative repair.  Patient seen this morning and currently in the operating room.  Holding Plavix at this time.  Patient already from  [facility] rehab. 2.  Anemia likely iron deficiency and dehydration.  May end up needing a blood transfusion during the hospital course.  Continue to monitor.  Guaiac stools.  Iron studies added on and ferritin 110.  B12 level will be checked tomorrow morning. 3.  Acute cystitis secondary to E. coli.  Started oral Keflex yesterday preop antibiotics will also cover.  Short course of antibiotic only. 4.  Scalp laceration from fall.  Stapled in ER.  Keflex will also cover. 5.  Essential hypertension on metoprolol 6.  Constipation MiraLAX started yesterday and also on Colace.  7.  Neuropathy on gabapentin    Code Status:     Code Status Orders  (From admission, onward)         Start     Ordered   04/21/20 1643  Full code  Continuous        04/21/20 1648        Code Status History    Date Active Date Inactive Code Status Order ID Comments User Context   07/18/2019 1359 07/21/2019 1905 Full Code [id number]  Lucile ShuttersAgbata, Tochukwu, MD ED   02/27/2019 0902 03/02/2019 2239 Full Code [id number]  Enedina FinnerPatel, Sona, MD ED   Advance Care Planning Activity     Family Communication: Spoke with son yesterday at the bedside, since orthopedic operating today they will speak with family. Disposition Plan: Status is: Inpatient  Dispo: The patient is from: [facility]              Anticipated d/c is to: [facility]              Anticipated d/c date is: In 2 to 3 days depending on postoperative course.              Patient currently having left hip repair in the operating room today  Consultants:  Orthopedic surgery  Antibiotics:  Keflex  Time spent: 27 minutes  Candra [employer]ner [employer]  [facility]

## 2020-04-23 NOTE — H&P (Signed)
THE PATIENT WAS SEEN PRIOR TO SURGERY TODAY.  HISTORY, ALLERGIES, HOME MEDICATIONS AND OPERATIVE PROCEDURE WERE REVIEWED. RISKS AND BENEFITS OF SURGERY DISCUSSED WITH PATIENT AGAIN.  NO CHANGES FROM INITIAL HISTORY AND PHYSICAL NOTED.    

## 2020-04-23 NOTE — Anesthesia Preprocedure Evaluation (Addendum)
Anesthesia Evaluation  Patient identified by MRN, date of birth, ID band Patient confused    Reviewed: Allergy & Precautions, H&P , NPO status , Patient's Chart, lab work & pertinent test results  History of Anesthesia Complications Negative for: history of anesthetic complications  Airway Mallampati: III  TM Distance: <3 FB Neck ROM: limited    Dental  (+) Chipped, Missing   Pulmonary neg pulmonary ROS, neg shortness of breath,    Pulmonary exam normal        Cardiovascular Exercise Tolerance: Good hypertension, (-) angina+ Peripheral Vascular Disease  (-) Past MI and (-) DOE Normal cardiovascular exam     Neuro/Psych PSYCHIATRIC DISORDERS Dementia negative neurological ROS     GI/Hepatic Neg liver ROS, GERD  Medicated and Controlled,  Endo/Other  negative endocrine ROS  Renal/GU      Musculoskeletal   Abdominal   Peds  Hematology negative hematology ROS (+)   Anesthesia Other Findings Past Medical History: No date: Chronic back pain No date: Diverticulosis No date: GERD (gastroesophageal reflux disease) No date: Hyperlipidemia No date: Hypertension No date: Irritable bowel syndrome No date: Lumbago No date: Peripheral arterial disease (HCC) No date: Spinal stenosis of lumbar region with neurogenic claudication  Past Surgical History: No date: APPENDECTOMY 1980: BREAST BIOPSY; Right     Comment:  neg 07/19/2019: ORIF FEMUR FRACTURE; Left     Comment:  Procedure: OPEN REDUCTION INTERNAL FIXATION (ORIF)               DISTAL FEMUR FRACTURE;  Surgeon: Lyndle Herrlich, MD;                Location: [facility] ORS;  Service: Orthopedics;  Laterality:               Left;  BMI    Body Mass Index: 18.38 kg/m      Reproductive/Obstetrics negative OB ROS                           Anesthesia Physical Anesthesia Plan  ASA: III  Anesthesia Plan: General ETT   Post-op Pain Management:     Induction: Intravenous  PONV Risk Score and Plan: Ondansetron, Dexamethasone, Midazolam and Treatment may vary due to age or medical condition  Airway Management Planned: Oral ETT  Additional Equipment:   Intra-op Plan:   Post-operative Plan: Extubation in OR  Informed Consent: I have reviewed the patients History and Physical, chart, labs and discussed the procedure including the risks, benefits and alternatives for the proposed anesthesia with the patient or authorized representative who has indicated his/her understanding and acceptance.     Dental Advisory Given  Plan Discussed with: Anesthesiologist, CRNA and Surgeon  Anesthesia Plan Comments: (Patient not a candidate for spinal 2/2 Plavix  History and consent from the patients son Christina Mooney  Son consented for risks of anesthesia including but not limited to:  - adverse reactions to medications - damage to eyes, teeth, lips or other oral mucosa - nerve damage due to positioning  - sore throat or hoarseness - Damage to heart, brain, nerves, lungs, other parts of body or loss of life  Son voiced understanding.)        Anesthesia Quick Evaluation

## 2020-04-23 NOTE — Transfer of Care (Signed)
Immediate Anesthesia Transfer of Care Note  Patient: Christina Mooney  Procedure(s) Performed: INTRAMEDULLARY (IM) NAIL INTERTROCHANTRIC (Left )  Patient Location: PACU  Anesthesia Type:General  Level of Consciousness: awake  Airway & Oxygen Therapy: Patient Spontanous Breathing and Patient connected to face mask oxygen  Post-op Assessment: Report given to RN and Post -op Vital signs reviewed and stable  Post vital signs: Reviewed and stable  Last Vitals:  Vitals Value Taken Time  BP 134/63 04/23/20 1438  Temp 36.6 C 04/23/20 1438  Pulse 96 04/23/20 1443  Resp 13 04/23/20 1443  SpO2 95 % 04/23/20 1443  Vitals shown include unvalidated device data.  Last Pain:  Vitals:   04/23/20 1235  TempSrc: Oral  PainSc:          Complications: No complications documented.

## 2020-04-23 NOTE — Anesthesia Procedure Notes (Signed)
Procedure Name: Intubation Date/Time: 04/23/2020 1:09 PM Performed by: Karoline Caldwell, CRNA Pre-anesthesia Checklist: Patient identified, Patient being monitored, Timeout performed, Emergency Drugs available and Suction available Patient Re-evaluated:Patient Re-evaluated prior to induction Oxygen Delivery Method: Circle system utilized Preoxygenation: Pre-oxygenation with 100% oxygen Induction Type: IV induction Ventilation: Mask ventilation without difficulty Laryngoscope Size: 3 and McGraph Grade View: Grade I Tube type: Oral Tube size: 6.5 mm Number of attempts: 1 Airway Equipment and Method: Stylet and Video-laryngoscopy Placement Confirmation: ETT inserted through vocal cords under direct vision,  positive ETCO2 and breath sounds checked- equal and bilateral Secured at: 19 cm Tube secured with: Tape Dental Injury: Teeth and Oropharynx as per pre-operative assessment

## 2020-04-24 ENCOUNTER — Encounter: Payer: Self-pay | Admitting: Specialist

## 2020-04-24 DIAGNOSIS — S7222XA Displaced subtrochanteric fracture of left femur, initial encounter for closed fracture: Principal | ICD-10-CM

## 2020-04-24 LAB — CBC
HCT: 21.8 % — ABNORMAL LOW (ref 36.0–46.0)
Hemoglobin: 7.1 g/dL — ABNORMAL LOW (ref 12.0–15.0)
MCH: 28.4 pg (ref 26.0–34.0)
MCHC: 32.6 g/dL (ref 30.0–36.0)
MCV: 87.2 fL (ref 80.0–100.0)
Platelets: 259 10*3/uL (ref 150–400)
RBC: 2.5 MIL/uL — ABNORMAL LOW (ref 3.87–5.11)
RDW: 14.7 % (ref 11.5–15.5)
WBC: 10.4 10*3/uL (ref 4.0–10.5)
nRBC: 0 % (ref 0.0–0.2)

## 2020-04-24 LAB — URINE CULTURE: Culture: >=100000 — AB

## 2020-04-24 LAB — VITAMIN B12: Vitamin B-12: 686 pg/mL (ref 180–914)

## 2020-04-24 LAB — BASIC METABOLIC PANEL
Anion gap: 11 (ref 5–15)
BUN: 18 mg/dL (ref 8–23)
CO2: 23 mmol/L (ref 22–32)
Calcium: 8.4 mg/dL — ABNORMAL LOW (ref 8.9–10.3)
Chloride: 108 mmol/L (ref 98–111)
Creatinine, Ser: 0.54 mg/dL (ref 0.44–1.00)
GFR, Estimated: >60 mL/min (ref >60)
Glucose, Bld: 118 mg/dL — ABNORMAL HIGH (ref 70–99)
Potassium: 3.5 mmol/L (ref 3.5–5.1)
Sodium: 142 mmol/L (ref 135–145)

## 2020-04-24 LAB — HEMOGLOBIN AND HEMATOCRIT, BLOOD
HCT: 26.7 % — ABNORMAL LOW (ref 36.0–46.0)
Hemoglobin: 9.1 g/dL — ABNORMAL LOW (ref 12.0–15.0)

## 2020-04-24 LAB — PREPARE RBC (CROSSMATCH)

## 2020-04-24 MED ORDER — CEFAZOLIN SODIUM-DEXTROSE 1-4 GM/50ML-% IV SOLN
1.0000 g | Freq: Two times a day (BID) | INTRAVENOUS | Status: DC
  Administered 2020-04-24 – 2020-04-26 (×4): 1 g via INTRAVENOUS
  Filled 2020-04-24 (×5): qty 50

## 2020-04-24 MED ORDER — CEFAZOLIN SODIUM-DEXTROSE 1-4 GM/50ML-% IV SOLN: 1.0000 g | Freq: Three times a day (TID) | INTRAVENOUS | Status: DC

## 2020-04-24 MED ORDER — CLOPIDOGREL BISULFATE 75 MG PO TABS
75.0000 mg | ORAL_TABLET | Freq: Every day | ORAL | Status: DC
  Administered 2020-04-25 – 2020-04-29 (×5): 75 mg via ORAL
  Filled 2020-04-24 (×5): qty 1

## 2020-04-24 MED ORDER — ASPIRIN EC 81 MG PO TBEC
81.0000 mg | DELAYED_RELEASE_TABLET | Freq: Every day | ORAL | Status: DC
  Administered 2020-04-25 – 2020-04-29 (×5): 81 mg via ORAL
  Filled 2020-04-24 (×5): qty 1

## 2020-04-24 MED ORDER — SODIUM CHLORIDE 0.9% IV SOLUTION: Freq: Once | INTRAVENOUS | Status: DC

## 2020-04-24 NOTE — Consult Note (Signed)
Pharmacy Antibiotic Note  Christina Mooney is a 86 y.o. female admitted on 04/21/2020 with UTI.  Pharmacy has been consulted for Cefazolin dosing. Day 2 antibiotics.   Urine culture: E.coli (sensitive to cefazolin)  Plan: Will adjust cefazolin from 2g Q8h to 1g Q12H x 5 total days (per consult)  Height: 4\' 11"  (149.9 cm) Weight: 41.3 kg (91 lb) IBW/kg (Calculated) : 43.2  Temp (24hrs), Avg:98.2 F (36.8 C), Min:97.6 F (36.4 C), Max:99.2 F (37.3 C)  Recent Labs  Lab 04/21/20 1049 04/23/20 0348 04/24/20 0534  WBC 11.1* 11.2* 10.4  CREATININE 0.67 0.49 0.54    Estimated Creatinine Clearance: 29.3 mL/min (by C-G formula based on SCr of 0.54 mg/dL).    Allergies  Allergen Reactions  . Codeine Nausea And Vomiting  . Garlic Other (See Comments)    indigestion  . Milk-Related Compounds Diarrhea and Other (See Comments)    Diarrhea/cramping  . Sorbitol Diarrhea and Other (See Comments)    Diarrhea/cramping    Antimicrobials this admission: 12/13 Keflex >> 12/13 12/14 Cefazolin >>   Microbiology results: 12/12 UCx: E.coli 12/13 MRSA PCR: (-)  Thank you for allowing pharmacy to be a part of this patient's care.  1/14 04/24/2020 9:07 AM

## 2020-04-24 NOTE — Progress Notes (Signed)
PROGRESS NOTE    KHOLE ARTERBURN  NWG:[id number] DOB: 1925/10/05 DOA: 04/21/2020 PCP: Marguarite Arbour, MD   Chief Complaint  Patient presents with  . Fall    Brief Narrative: 86 year old female with history of GERD, dementia, carotid artery stenosis on Plavix, hyperlipidemia, hypertension, fibromuscular dysplasia presented to the ED after a fall at the skilled nursing facility after she tripped and fell on her left side. In the ED vitals are stable,Labs include: Sodium 135, potassium 3.4, chloride 100, bicarb 21, glucose 158, BUN 20, creatinine 0.67, calcium 9.7, and anion gap 14, alkaline phosphatase 59, AST 32, ALT 30, total bilirubin 1.0, WBC 11.1, hemoglobin 12.2, hematocrit 36.7, platelet 349, influenza negative, Covid negative. CT head: Marland Kitchen Laceration is seen involving the left parietal scalp near the vertex with overlying hematoma. 2. Mild diffuse cortical atrophy. Mild chronic ischemic white matter disease. No acute intracranial abnormality seen. CT hip left: There is a displaced intertrochanteric fracture of the left femur. There are additional fractures of the left pubic symphysis, which appear acute, and additional chronic fracture deformities of the bilateral pubic rami, which were present on prior radiographs. Partially imaged plate and screw fixation of the distal left femur. Osteopenia. Seen by orthopedic and underwent operative intervention on 04/23/20.  Subjective: HB dropped to 7.1 from 7.9.  Urine culture came back positive for E. Coli Patient is stable, afebrile Pain controlled appears weak, pale, has not eaten well in may days  Assessment & Plan:  Closed left subtrochanteric femur fracture status post operative repair 12/14 by orthopedics.  Preop on Plavix and allowed washout.  Anemia multifactorial from acute blood loss in the setting of Plavix/hip fracture and also with iron deficiency: Hemoglobin has dropped, continue iron follow-up B12.  Discharge the patient  and her son at the bedside regarding blood transfusion and they have agreed.   Recent Labs  Lab 04/21/20 1049 04/23/20 0348 04/24/20 0534  HGB 12.2 7.9* 7.1*  HCT 36.7 24.3* 21.8*   Mild dementia/cognitive impairment and decline over past years, poor po intake.  Acute cystitis/UTI with E. Coli-pharmacy consulted to continue IV antibiotics and is receiving.  Antibiotics.  Scalp laceration from fall stapled in the ER  Constipation on MiraLAX/Colace  Essential hypertension: BP is controlled.  On Toprol.  Neuropathy on gabapentin  GERD: Continue PPI.  Carotid artery stenosis/Aortic atherosclerosis/Hyperlipidemia- on plavix and currently on hold. Resume once okay w/ ortho and once h/h stable.  Nutrition: Diet Order            DIET SOFT Room service appropriate? Yes; Fluid consistency: Thin  Diet effective now                 Nutrition Problem: Severe Malnutrition Etiology: social / environmental circumstances (advanced age, suspected inadequate oral intake) Signs/Symptoms: severe fat depletion,severe muscle depletion Interventions: Refer to RD note for recommendations  Body mass index is 18.38 kg/m.  DVT prophylaxis: enoxaparin (LOVENOX) injection 30 mg Start: 04/24/20 0800 SCDs Start: 04/23/20 1702 Code Status:   Code Status: Full Code  Family Communication: plan of care discussed with patient and son at bedside.  Status is: Inpatient Remains inpatient appropriate because:Ongoing active pain requiring inpatient pain management and Inpatient level of care appropriate due to severity of illness  Dispo: The patient is from: [facility]              Anticipated d/c is to: SNF-same place              Anticipated d/c  date is: 2 days              Patient currently is not medically stable to d/c.  Consultants:see note  Procedures:see note  Culture/Microbiology    Component Value Date/Time   SDES  04/21/2020 2016    URINE, CATHETERIZED Performed at [facility], [address] [place], [place] [address]    [facility]  04/21/2020 2016    NONE Performed at [facility], [address] [place], [place] [address]    CULT (A) 04/21/2020 2016    >=100,000 COLONIES/mL ESCHERICHIA COLI SUSCEPTIBILITIES TO FOLLOW Performed at [facility], [address] [place], [place] [address]    REPTSTATUS PENDING 04/21/2020 2016    Other culture-see note  Medications: Scheduled Meds: . acetaminophen  650 mg Oral TID  . docusate sodium  100 mg Oral BID  . enoxaparin (LOVENOX) injection  30 mg Subcutaneous Q24H  . feeding supplement  237 mL Oral TID BM  . ferrous sulfate  325 mg Oral Q breakfast  . metoprolol succinate  12.5 mg Oral Daily  . pantoprazole  40 mg Oral Daily  . polyethylene glycol  17 g Oral Daily  . senna  1 tablet Oral BID   Continuous Infusions: . sodium chloride 75 mL/hr at 04/23/20 1834  .  ceFAZolin (ANCEF) IV 2 g (04/24/20 0609)  . clindamycin (CLEOCIN) IV 600 mg (04/24/20 3875)    Antimicrobials: Anti-infectives (From admission, onward)   Start     Dose/Rate Route Frequency Ordered Stop   04/24/20 0600  ceFAZolin (ANCEF) IVPB 2g/100 mL premix        2 g 200 mL/hr over 30 Minutes Intravenous On call to O.R. 04/23/20 1222 04/23/20 1345   04/24/20 0600  clindamycin (CLEOCIN) IVPB 600 mg        600 mg 100 mL/hr over 30 Minutes Intravenous On call to O.R. 04/23/20 1222 04/23/20 1340   04/23/20 2200  ceFAZolin (ANCEF) IVPB 2g/100 mL premix        2 g 200 mL/hr over 30 Minutes Intravenous Every 8 hours 04/23/20 1702 04/24/20 2159   04/23/20 2200  clindamycin (CLEOCIN) IVPB 600 mg        600 mg 100 mL/hr over 30 Minutes Intravenous Every 8 hours 04/23/20 1702 04/24/20 2159   04/23/20 1341  gentamicin 80 mg in 0.9% sodium chloride 250 mL irrigation  Status:  Discontinued          As needed 04/23/20 1406 04/23/20 1434   04/23/20 1230  clindamycin (CLEOCIN) 600 MG/50ML IVPB       Note to Pharmacy:  Renie Ora   : cabinet override      04/23/20 1230 04/23/20 1326   04/23/20 1230  ceFAZolin (ANCEF) 2-4 GM/100ML-% IVPB       Note to Pharmacy: Renie Ora   : cabinet override      04/23/20 1230 04/23/20 1326   04/22/20 1400  cephALEXin (KEFLEX) capsule 250 mg  Status:  Discontinued        250 mg Oral Every 8 hours 04/22/20 1230 04/23/20 1702   04/22/20 0600  ceFAZolin (ANCEF) IVPB 2g/100 mL premix        2 g 200 mL/hr over 30 Minutes Intravenous On call to O.R. 04/21/20 1429 04/23/20 0559   04/22/20 0600  clindamycin (CLEOCIN) IVPB 600 mg        600 mg 100 mL/hr over 30 Minutes Intravenous On call to O.R. 04/21/20 1429 04/23/20 0559  Objective: Vitals: Today's Vitals   04/24/20 0033 04/24/20 0450 04/24/20 0726 04/24/20 0747  BP: 124/77 125/66  124/60  Pulse: 86 91  83  Resp: 16 16  15   Temp: 97.6 F (36.4 C) 97.8 F (36.6 C)  98.3 F (36.8 C)  TempSrc:    Oral  SpO2: 98% 96%  100%  Weight:      Height:      PainSc:   Asleep     Intake/Output Summary (Last 24 hours) at 04/24/2020 0815 Last data filed at 04/24/2020 0651 Gross per 24 hour  Intake 300 ml  Output 300 ml  Net 0 ml   Filed Weights   04/21/20 1046  Weight: 41.3 kg   Weight change:   Intake/Output from previous day: 12/14 0701 - 12/15 0700 In: 300 [I.V.:200; IV Piggyback:100] Out: 300 [Urine:250; Blood:50] Intake/Output this shift: No intake/output data recorded.  Examination: General exam: AAO at baseline, elderly, frail, NAD, weak appearing. HEENT:Oral mucosa moist, Ear/Nose WNL grossly,dentition normal. Respiratory system: bilaterally clear,no wheezing or crackles,no use of accessory muscle, non tender. Cardiovascular system: S1 & S2 +, regular, No JVD. Gastrointestinal system: Abdomen soft, NT,ND, BS+. Nervous System:Alert, awake, moving extremities and grossly nonfocal Extremities: No edema, distal peripheral pulses palpable.  Skin: No rashes,no icterus. MSK: Normal muscle  bulk,tone, power  Data Reviewed: I have personally reviewed following labs and imaging studies CBC: Recent Labs  Lab 04/21/20 1049 04/23/20 0348 04/24/20 0534  WBC 11.1* 11.2* 10.4  NEUTROABS 10.0*  --   --   HGB 12.2 7.9* 7.1*  HCT 36.7 24.3* 21.8*  MCV 86.6 86.2 87.2  PLT 349 254 259   Basic Metabolic Panel: Recent Labs  Lab 04/21/20 1049 04/23/20 0348 04/24/20 0534  NA 135 143 142  K 3.4* 3.2* 3.5  CL 100 110 108  CO2 21* 22 23  GLUCOSE 158* 84 118*  BUN 20 20 18   CREATININE 0.67 0.49 0.54  CALCIUM 9.7 8.7* 8.4*  MG  --  2.2  --   PHOS  --  2.2*  --    GFR: Estimated Creatinine Clearance: 29.3 mL/min (by C-G formula based on SCr of 0.54 mg/dL). Liver Function Tests: Recent Labs  Lab 04/21/20 1049  AST 32  ALT 30  ALKPHOS 59  BILITOT 1.0  PROT 7.9  ALBUMIN 3.8   No results for input(s): LIPASE, AMYLASE in the last 168 hours. No results for input(s): AMMONIA in the last 168 hours. Coagulation Profile: Recent Labs  Lab 04/21/20 1049  INR 1.1   Cardiac Enzymes: No results for input(s): CKTOTAL, CKMB, CKMBINDEX, TROPONINI in the last 168 hours. BNP (last 3 results) No results for input(s): PROBNP in the last 8760 hours. HbA1C: No results for input(s): HGBA1C in the last 72 hours. CBG: No results for input(s): GLUCAP in the last 168 hours. Lipid Profile: No results for input(s): CHOL, HDL, LDLCALC, TRIG, CHOLHDL, LDLDIRECT in the last 72 hours. Thyroid Function Tests: No results for input(s): TSH, T4TOTAL, FREET4, T3FREE, THYROIDAB in the last 72 hours. Anemia Panel: Recent Labs    04/23/20 0348  FERRITIN 110  TIBC 228*  IRON 13*   Sepsis Labs: No results for input(s): PROCALCITON, LATICACIDVEN in the last 168 hours.  Recent Results (from the past 240 hour(s))  Resp Panel by RT-PCR (Flu A&B, Covid) Nasopharyngeal Swab     Status: None   Collection Time: 04/21/20  2:09 PM   Specimen: Nasopharyngeal Swab; Nasopharyngeal(NP) swabs in vial  transport medium  Result  Value Ref Range Status   SARS Coronavirus 2 by RT PCR NEGATIVE NEGATIVE Final    Comment: (NOTE) SARS-CoV-2 target nucleic acids are NOT DETECTED.  The SARS-CoV-2 RNA is generally detectable in upper respiratory specimens during the acute phase of infection. The lowest concentration of SARS-CoV-2 viral copies this assay can detect is 138 copies/mL. A negative result does not preclude SARS-Cov-2 infection and should not be used as the sole basis for treatment or other patient management decisions. A negative result may occur with  improper specimen collection/handling, submission of specimen other than nasopharyngeal swab, presence of viral mutation(s) within the areas targeted by this assay, and inadequate number of viral copies(<138 copies/mL). A negative result must be combined with clinical observations, patient history, and epidemiological information. The expected result is Negative.  Fact Sheet for Patients:  BloggerCourse.com  Fact Sheet for Healthcare Providers:  SeriousBroker.it  This test is no t yet approved or cleared by the [place] FDA and  has been authorized for detection and/or diagnosis of SARS-CoV-2 by FDA under an Emergency Use Authorization (EUA). This EUA will remain  in effect (meaning this test can be used) for the duration of the COVID-19 declaration under Section 564(b)(1) of the Act, 21 U.S.C.section 360bbb-3(b)(1), unless the authorization is terminated  or revoked sooner.       Influenza A by PCR NEGATIVE NEGATIVE Final   Influenza B by PCR NEGATIVE NEGATIVE Final    Comment: (NOTE) The Xpert Xpress SARS-CoV-2/FLU/RSV plus assay is intended as an aid in the diagnosis of influenza from Nasopharyngeal swab specimens and should not be used as a sole basis for treatment. Nasal washings and aspirates are unacceptable for Xpert Xpress SARS-CoV-2/FLU/RSV testing.  Fact  Sheet for Patients: BloggerCourse.com  Fact Sheet for Healthcare Providers: SeriousBroker.it  This test is not yet approved or cleared by the [place] FDA and has been authorized for detection and/or diagnosis of SARS-CoV-2 by FDA under an Emergency Use Authorization (EUA). This EUA will remain in effect (meaning this test can be used) for the duration of the COVID-19 declaration under Section 564(b)(1) of the Act, 21 U.S.C. section 360bbb-3(b)(1), unless the authorization is terminated or revoked.  Performed at [facility], [address] [place], [place] [address]   Urine Culture     Status: Abnormal (Preliminary result)   Collection Time: 04/21/20  8:16 PM   Specimen: Urine, Catheterized  Result Value Ref Range Status   Specimen Description   Final    URINE, CATHETERIZED Performed at [facility], [address], [place], [place] [address]    Special Requests   Final    NONE Performed at [facility], [address] [place], [place] [address]    Culture (A)  Final    >=100,000 COLONIES/mL ESCHERICHIA COLI SUSCEPTIBILITIES TO FOLLOW Performed at [facility], [address], [place], [place] [address]    Report Status PENDING  Incomplete  MRSA PCR Screening     Status: None   Collection Time: 04/22/20  9:03 AM   Specimen: Nasal Mucosa; Nasopharyngeal  Result Value Ref Range Status   MRSA by PCR NEGATIVE NEGATIVE Final    Comment:        The GeneXpert MRSA Assay (FDA approved for NASAL specimens only), is one component of a comprehensive MRSA colonization surveillance program. It is not intended to diagnose MRSA infection nor to guide or monitor treatment for MRSA infections. Performed at [facility], [address] [place],  [place] [id number]      Radiology Studies: DG HIP OPERATIVE UNILAT W OR W/O PELVIS LEFT  Result Date: 04/23/2020 CLINICAL DATA:  Left  IM nail for femur fracture. EXAM: OPERATIVE left HIP (WITH PELVIS IF PERFORMED) TECHNIQUE: Fluoroscopic spot image(s) were submitted for interpretation post-operatively. COMPARISON:  Preoperative radiograph 04/21/2020 FINDINGS: Three fluoroscopic spot views obtained in the operating room of the left hip in frontal and lateral projections. Intramedullary nail with trans trochanteric and distal locking screws fixating intertrochanteric femur fracture. Fracture is in improved alignment fluoroscopy time 46 seconds. IMPRESSION: Intraoperative fluoroscopy after ORIF of left intertrochanteric femur fracture. Electronically Signed   By: Narda RutherfordMelanie  Sanford M.D.   On: 04/23/2020 15:21     LOS: 3 days   Lanae Boastamesh Harrell Niehoff, MD [facility]  04/24/2020, 8:15 AM

## 2020-04-24 NOTE — Evaluation (Signed)
Physical Therapy Evaluation Patient Details Name: Christina Mooney MRN: [id number] DOB: April 07, 1926 Today's Date: 04/24/2020   History of Present Illness  Pt is a 86 y.o. female presenting to hospital 12/12 s/p fall when in bathroom.  Pt sustained large laceration L parietal scalp.  Imaging showing displaced intertrochanteric fx L femur, acute appearing fx's of L pubic symphysis, and chronic fx deformities of B pubic rami.  Pt s/p L IMN intertrochantric 04/23/20.  PMH includes s/p ORIF distal L femur fx 07/19/19, carotid stenosis, htn, dementia, chronic back pain, IBS, PAD, and spinal stenosis of lumbar region with neurogenic claudication.  Clinical Impression  Prior to hospital admission, pt was ambulatory with RW (used w/c for longer distances); long term resident of [facility].  Currently pt is max assist semi-supine to/from sitting edge of bed and able to sit on edge of bed close SBA x5 minutes.  Limited time sitting edge of bed d/t fatigue.  Minimal active participation with LE ex's.  No c/o pain or pain-like behavior noted during sessions activities.  Pt's son present during session and able to provide PLOF details.  Pt would benefit from skilled PT to address noted impairments and functional limitations (see below for any additional details).  Upon hospital discharge, pt would benefit from STR.    Follow Up Recommendations SNF    Equipment Recommendations  Rolling walker with 5" wheels;3in1 (PT);Wheelchair (measurements PT);Wheelchair cushion (measurements PT) (youth sized)    Recommendations for Other Services       Precautions / Restrictions Precautions Precautions: Fall Restrictions Weight Bearing Restrictions: Yes LLE Weight Bearing: Partial weight bearing      Mobility  Bed Mobility Overal bed mobility: Needs Assistance Bed Mobility: Supine to Sit;Sit to Supine     Supine to sit: Max assist;HOB elevated Sit to supine: Max assist;HOB elevated   General bed mobility  comments: assist for trunk and B LE's    Transfers                 General transfer comment: Deferred d/t pt fatigue  Ambulation/Gait                Stairs            Wheelchair Mobility    Modified Rankin (Stroke Patients Only)       Balance Overall balance assessment: Needs assistance Sitting-balance support: Single extremity supported;Feet unsupported Sitting balance-Leahy Scale: Fair Sitting balance - Comments: steady static sitting x5 minutes (close SBA for safety)                                     Pertinent Vitals/Pain Pain Assessment: Faces Faces Pain Scale: No hurt Pain Location: L hip/thigh Pain Intervention(s): Limited activity within patient's tolerance;Monitored during session;Repositioned;Ice applied  Vitals (HR and O2 on 2 L via nasal cannula) stable and WFL throughout treatment session.    Home Living Family/patient expects to be discharged to:: Skilled nursing facility                 Additional Comments: Pt at SNF at [facility] (long term resident)    Prior Function Level of Independence: Needs assistance   Gait / Transfers Assistance Needed: Pt was ambulatory with RW to meals if feeling good (100-150 feet) or used w/c otherwise.  Pt's son reports since pt's fall in October 2020 pt was not supposed to get up on her own (but unsure  recently if staff present when pt up ambulating).  ADL's / Homemaking Assistance Needed: Assist from staff with ADLs        Hand Dominance        Extremity/Trunk Assessment   Upper Extremity Assessment Upper Extremity Assessment: Generalized weakness    Lower Extremity Assessment Lower Extremity Assessment:  (pt with minimal active movement with L LE when cued (similar to R side))    Cervical / Trunk Assessment Cervical / Trunk Assessment: Other exceptions Cervical / Trunk Exceptions: forward head/shoulders  Communication   Communication: HOH  Cognition  Arousal/Alertness:  (pt appearing tired) Behavior During Therapy: Flat affect                                   General Comments: Oriented to person; confusion noted during session      General Comments General comments (skin integrity, edema, etc.): mild drainage noted L hip/thigh dressing.  Nursing cleared pt for participation in physical therapy.  Pt agreeable to PT session.  Pt's son present entire session.    Exercises General Exercises - Lower Extremity Ankle Circles/Pumps: AAROM;Strengthening;Both;10 reps;Supine Heel Slides: AAROM;Strengthening;Left;10 reps;Supine Hip ABduction/ADduction: AAROM;Strengthening;Left;10 reps;Supine   Assessment/Plan    PT Assessment Patient needs continued PT services  PT Problem List Decreased strength;Decreased activity tolerance;Decreased balance;Decreased mobility;Decreased knowledge of use of DME;Decreased knowledge of precautions;Pain;Decreased skin integrity       PT Treatment Interventions DME instruction;Gait training;Functional mobility training;Therapeutic activities;Therapeutic exercise;Balance training;Patient/family education    PT Goals (Current goals can be found in the Care Plan section)  Acute Rehab PT Goals Patient Stated Goal: to improve strength and mobility PT Goal Formulation: With patient/family Time For Goal Achievement: 05/08/20 Potential to Achieve Goals: Fair    Frequency 7X/week   Barriers to discharge Decreased caregiver support      Co-evaluation               AM-PAC PT "6 Clicks" Mobility  Outcome Measure Help needed turning from your back to your side while in a flat bed without using bedrails?: A Lot Help needed moving from lying on your back to sitting on the side of a flat bed without using bedrails?: A Lot Help needed moving to and from a bed to a chair (including a wheelchair)?: Total Help needed standing up from a chair using your arms (e.g., wheelchair or bedside chair)?:  Total Help needed to walk in hospital room?: Total Help needed climbing 3-5 steps with a railing? : Total 6 Click Score: 8    End of Session Equipment Utilized During Treatment: Oxygen (2 L O2 via nasal cannula) Activity Tolerance: Patient limited by fatigue Patient left: in bed;with call bell/phone within reach;with family/visitor present;with SCD's reapplied;Other (comment) (B heels floating via pillow support; bed in lowest position; unable to set bed alarm without it continuously going off so bed alarm left off with pt's son in room (nurse notified immediately)) Nurse Communication: Mobility status;Precautions;Weight bearing status (bed alarm not on) PT Visit Diagnosis: Other abnormalities of gait and mobility (R26.89);Muscle weakness (generalized) (M62.81);History of falling (Z91.81);Difficulty in walking, not elsewhere classified (R26.2);Pain Pain - Right/Left: Left Pain - part of body: Hip    Time: 1017-5102 PT Time Calculation (min) (ACUTE ONLY): 47 min   Charges:   PT Evaluation $PT Eval Low Complexity: 1 Low PT Treatments $Therapeutic Exercise: 8-22 mins       Hendricks Limes, PT 04/24/20, 11:09 AM

## 2020-04-24 NOTE — Anesthesia Postprocedure Evaluation (Signed)
Anesthesia Post Note  Patient: Christina Mooney  Procedure(s) Performed: INTRAMEDULLARY (IM) NAIL INTERTROCHANTRIC (Left )  Patient location during evaluation: Nursing Unit Anesthesia Type: General Level of consciousness: awake Pain management: pain level controlled Vital Signs Assessment: post-procedure vital signs reviewed and stable Respiratory status: spontaneous breathing Cardiovascular status: blood pressure returned to baseline and stable Postop Assessment: no headache, no backache, no apparent nausea or vomiting and patient able to bend at knees Anesthetic complications: no   No complications documented.   Last Vitals:  Vitals:   04/24/20 1239 04/24/20 1523  BP: (!) 126/56 136/62  Pulse: 79 79  Resp: 16 15  Temp: 36.6 C 36.5 C  SpO2: 98% 100%    Last Pain:  Vitals:   04/24/20 1523  TempSrc: Oral  PainSc:                  Cleda Mccreedy Estefana Taylor

## 2020-04-24 NOTE — Progress Notes (Signed)
Subjective: 1 Day Post-Op Procedure(s) (LRB): INTRAMEDULLARY (IM) NAIL INTERTROCHANTRIC (Left)   Doing fairly well today.  Awake and conversant.  Getting 1 unit of blood right now.  Hemoglobin was 7.1.  Start PT tomorrow morning.  Dressing with minimal drainage.  Patient reports pain as mild.  Objective:   VITALS:   Vitals:   04/24/20 1214 04/24/20 1239  BP: (!) 127/52 (!) 126/56  Pulse: 78 79  Resp: 16 16  Temp: 97.9 F (36.6 C) 97.8 F (36.6 C)  SpO2: 100% 98%    Neurologically intact Dorsiflexion/Plantar flexion intact Incision: scant drainage  LABS Recent Labs    04/23/20 0348 04/24/20 0534  HGB 7.9* 7.1*  HCT 24.3* 21.8*  WBC 11.2* 10.4  PLT 254 259    Recent Labs    04/23/20 0348 04/24/20 0534  NA 143 142  K 3.2* 3.5  BUN 20 18  CREATININE 0.49 0.54  GLUCOSE 84 118*    No results for input(s): LABPT, INR in the last 72 hours.   Assessment/Plan: 1 Day Post-Op Procedure(s) (LRB): INTRAMEDULLARY (IM) NAIL INTERTROCHANTRIC (Left)   Advance diet Up with therapy Discharge to SNF when stable.  She came here from [facility].  Enteric-coated aspirin twice a day discharge for DVT prophylaxis Partial weightbearing left leg Return to clinic 2 weeks for x-ray and stitch removal

## 2020-04-25 LAB — TYPE AND SCREEN
ABO/RH(D): A NEG
Antibody Screen: NEGATIVE
Unit division: 0
Unit division: 0

## 2020-04-25 LAB — BPAM RBC
Blood Product Expiration Date: 202112272359
Blood Product Expiration Date: 202112292359
ISSUE DATE / TIME: 202112151219
Unit Type and Rh: 600
Unit Type and Rh: 600

## 2020-04-25 LAB — CBC
HCT: 27.5 % — ABNORMAL LOW (ref 36.0–46.0)
Hemoglobin: 9.6 g/dL — ABNORMAL LOW (ref 12.0–15.0)
MCH: 29.9 pg (ref 26.0–34.0)
MCHC: 34.9 g/dL (ref 30.0–36.0)
MCV: 85.7 fL (ref 80.0–100.0)
Platelets: 284 10*3/uL (ref 150–400)
RBC: 3.21 MIL/uL — ABNORMAL LOW (ref 3.87–5.11)
RDW: 13.9 % (ref 11.5–15.5)
WBC: 9.8 10*3/uL (ref 4.0–10.5)
nRBC: 0.6 % — ABNORMAL HIGH (ref 0.0–0.2)

## 2020-04-25 LAB — BASIC METABOLIC PANEL
Anion gap: 9 (ref 5–15)
BUN: 15 mg/dL (ref 8–23)
CO2: 26 mmol/L (ref 22–32)
Calcium: 8.4 mg/dL — ABNORMAL LOW (ref 8.9–10.3)
Chloride: 107 mmol/L (ref 98–111)
Creatinine, Ser: 0.42 mg/dL — ABNORMAL LOW (ref 0.44–1.00)
GFR, Estimated: >60 mL/min (ref >60)
Glucose, Bld: 99 mg/dL (ref 70–99)
Potassium: 3.1 mmol/L — ABNORMAL LOW (ref 3.5–5.1)
Sodium: 142 mmol/L (ref 135–145)

## 2020-04-25 MED ORDER — POTASSIUM CHLORIDE CRYS ER 20 MEQ PO TBCR
40.0000 meq | EXTENDED_RELEASE_TABLET | Freq: Once | ORAL | Status: AC
  Administered 2020-04-25: 09:00:00 40 meq via ORAL
  Filled 2020-04-25: qty 2

## 2020-04-25 NOTE — Progress Notes (Signed)
PROGRESS NOTE    Christina Mooney  ZHG:[id number] DOB: [birth year] DOA: 04/21/2020 PCP: Karie Schwalbe, MD   Chief Complaint  Patient presents with  . Fall    Brief Narrative: 86 year old female with history of GERD, dementia, carotid artery stenosis on Plavix, hyperlipidemia, hypertension, fibromuscular dysplasia presented to the ED after a fall at the skilled nursing facility after she tripped and fell on her left side. In the ED vitals are stable,Labs include: Sodium 135, potassium 3.4, chloride 100, bicarb 21, glucose 158, BUN 20, creatinine 0.67, calcium 9.7, and anion gap 14, alkaline phosphatase 59, AST 32, ALT 30, total bilirubin 1.0, WBC 11.1, hemoglobin 12.2, hematocrit 36.7, platelet 349, influenza negative, Covid negative. CT head: Marland Kitchen Laceration is seen involving the left parietal scalp near the vertex with overlying hematoma. 2. Mild diffuse cortical atrophy. Mild chronic ischemic white matter disease. No acute intracranial abnormality seen. CT hip left: There is a displaced intertrochanteric fracture of the left femur. There are additional fractures of the left pubic symphysis, which appear acute, and additional chronic fracture deformities of the bilateral pubic rami, which were present on prior radiographs. Partially imaged plate and screw fixation of the distal left femur. Osteopenia. Seen by orthopedic and underwent operative intervention on 04/23/20. Postop acute blood loss anemia noted 12/15 needing 1 unit PRBC  Subjective: Seen this morning patient appears perked up alert awake, denies any pain. Hemoglobin improved to 9 g Son at the bedside.  Assessment & Plan:  Closed left subtrochanteric femur fracture status post operative repair 12/14 by orthopedics.  Preop on Plavix and was allowed washout. PT OT evaluation today, continue pain control and likely need placement  Anemia multifactorial from acute blood loss in the setting of Plavix/hip fracture and also with iron  deficiency: As low as 7.1 g. Patient wanted [facility] hemoglobin has improved. At baseline around 8 to 9 g.   Recent Labs  Lab 04/21/20 1049 04/23/20 0348 04/24/20 0534 04/24/20 1648 04/25/20 0426  HGB 12.2 7.9* 7.1* 9.1* 9.6*  HCT 36.7 24.3* 21.8* 26.7* 27.5*   Mild dementia/cognitive impairment and decline over past years, poor po intake: Son assisting closely. Continue supportive measures PT OT.  Acute cystitis/UTI with E. Coli-continue Ancef will complete 5 days course.   Scalp laceration from fall stapled in the ER. Continue wound care  Constipation continue on MiraLAX/Colace  Essential hypertension: BP is fairly controlled.On Toprol.  Neuropathy cont  gabapentin  GERD: PPI to cont  Carotid artery stenosis/Aortic atherosclerosis/Hyperlipidemia- on plavix and resumed post op  Nutrition: Diet Order            Diet regular Room service appropriate? Yes; Fluid consistency: Thin  Diet effective now                 Nutrition Problem: Severe Malnutrition Etiology: social / environmental circumstances (advanced age, suspected inadequate oral intake) Signs/Symptoms: severe fat depletion,severe muscle depletion Interventions: Refer to RD note for recommendations  Body mass index is 18.38 kg/m.  DVT prophylaxis: SCDs Start: 04/23/20 1702 Code Status:   Code Status: Full Code  Family Communication: plan of care discussed with patient and son at bedside.  Status is: Inpatient Remains inpatient appropriate because:Ongoing active pain requiring inpatient pain management and Inpatient level of care appropriate due to severity of illness  Dispo: The patient is from: [facility]              Anticipated d/c is to: SNF-same place  Anticipated d/c date is: 1 day once okay w/ ortho              Patient currently is not medically stable to d/c.  Consultants:see note  Procedures:see note  Culture/Microbiology    Component Value Date/Time   SDES   04/21/2020 2016    URINE, CATHETERIZED Performed at [facility], [address] [place], [place] [address]    [facility]  04/21/2020 2016    NONE Performed at [facility], [address] [place], [place] [address]    CULT >=100,000 COLONIES/mL ESCHERICHIA COLI (A) 04/21/2020 2016   REPTSTATUS 04/24/2020 FINAL 04/21/2020 2016    Other culture-see note  Medications: Scheduled Meds: . sodium chloride   Intravenous Once  . acetaminophen  650 mg Oral TID  . aspirin EC  81 mg Oral Daily  . clopidogrel  75 mg Oral Daily  . docusate sodium  100 mg Oral BID  . feeding supplement  237 mL Oral TID BM  . ferrous sulfate  325 mg Oral Q breakfast  . metoprolol succinate  12.5 mg Oral Daily  . pantoprazole  40 mg Oral Daily  . polyethylene glycol  17 g Oral Daily  . senna  1 tablet Oral BID   Continuous Infusions: . sodium chloride 75 mL/hr at 04/23/20 1834  .  ceFAZolin (ANCEF) IV 1 g (04/25/20 2956)    Antimicrobials: Anti-infectives (From admission, onward)   Start     Dose/Rate Route Frequency Ordered Stop   04/24/20 1800  ceFAZolin (ANCEF) IVPB 1 g/50 mL premix        1 g 100 mL/hr over 30 Minutes Intravenous Every 12 hours 04/24/20 0935 04/28/20 1759   04/24/20 1400  ceFAZolin (ANCEF) IVPB 1 g/50 mL premix  Status:  Discontinued        1 g 100 mL/hr over 30 Minutes Intravenous Every 8 hours 04/24/20 0932 04/24/20 0933   04/24/20 0600  ceFAZolin (ANCEF) IVPB 2g/100 mL premix        2 g 200 mL/hr over 30 Minutes Intravenous On call to O.R. 04/23/20 1222 04/23/20 1345   04/24/20 0600  clindamycin (CLEOCIN) IVPB 600 mg        600 mg 100 mL/hr over 30 Minutes Intravenous On call to O.R. 04/23/20 1222 04/23/20 1340   04/23/20 2200  ceFAZolin (ANCEF) IVPB 2g/100 mL premix  Status:  Discontinued        2 g 200 mL/hr over 30 Minutes Intravenous Every 8 hours 04/23/20 1702 04/24/20 0932   04/23/20 2200  clindamycin (CLEOCIN) IVPB 600 mg        600 mg 100  mL/hr over 30 Minutes Intravenous Every 8 hours 04/23/20 1702 04/24/20 1703   04/23/20 1341  gentamicin 80 mg in 0.9% sodium chloride 250 mL irrigation  Status:  Discontinued          As needed 04/23/20 1406 04/23/20 1434   04/23/20 1230  clindamycin (CLEOCIN) 600 MG/50ML IVPB       Note to Pharmacy: Renie Ora   : cabinet override      04/23/20 1230 04/23/20 1326   04/23/20 1230  ceFAZolin (ANCEF) 2-4 GM/100ML-% IVPB       Note to Pharmacy: Renie Ora   : cabinet override      04/23/20 1230 04/23/20 1326   04/22/20 1400  cephALEXin (KEFLEX) capsule 250 mg  Status:  Discontinued        250 mg Oral Every 8 hours 04/22/20 1230 04/23/20  1702   04/22/20 0600  ceFAZolin (ANCEF) IVPB 2g/100 mL premix        2 g 200 mL/hr over 30 Minutes Intravenous On call to O.R. 04/21/20 1429 04/23/20 0559   04/22/20 0600  clindamycin (CLEOCIN) IVPB 600 mg        600 mg 100 mL/hr over 30 Minutes Intravenous On call to O.R. 04/21/20 1429 04/23/20 0559     Objective: Vitals: Today's Vitals   04/25/20 0308 04/25/20 0443 04/25/20 0444 04/25/20 0755  BP:  (!) 140/52  (!) 147/58  Pulse:  71 71 73  Resp:  16  15  Temp:  97.9 F (36.6 C)  98.3 F (36.8 C)  TempSrc:      SpO2:  (!) 89% 97% 94%  Weight:      Height:      PainSc: Asleep       Intake/Output Summary (Last 24 hours) at 04/25/2020 1022 Last data filed at 04/25/2020 0453 Gross per 24 hour  Intake 1782.95 ml  Output 450 ml  Net 1332.95 ml   Filed Weights   04/21/20 1046  Weight: 41.3 kg   Weight change:   Intake/Output from previous day: 12/15 0701 - 12/16 0700 In: 1783 [I.V.:1353; Blood:380; IV Piggyback:50] Out: 450 [Urine:450] Intake/Output this shift: No intake/output data recorded.  Examination: General exam: AAO at baseline , NAD, weak appearing. HEENT:Oral mucosa moist, Ear/Nose WNL grossly, dentition normal. Respiratory system: bilaterally clear,no wheezing or crackles,no use of accessory muscle Cardiovascular  system: S1 & S2 +, No JVD,. Gastrointestinal system: Abdomen soft, NT,ND, BS+ Nervous System:Alert, awake, moving extremities and grossly nonfocal Extremities: No edema, distal peripheral pulses palpable. Left surgical hip site clean dry intact Skin: No rashes,no icterus. MSK: Normal muscle bulk,tone, power  Data Reviewed: I have personally reviewed following labs and imaging studies CBC: Recent Labs  Lab 04/21/20 1049 04/23/20 0348 04/24/20 0534 04/24/20 1648 04/25/20 0426  WBC 11.1* 11.2* 10.4  --  9.8  NEUTROABS 10.0*  --   --   --   --   HGB 12.2 7.9* 7.1* 9.1* 9.6*  HCT 36.7 24.3* 21.8* 26.7* 27.5*  MCV 86.6 86.2 87.2  --  85.7  PLT 349 254 259  --  284   Basic Metabolic Panel: Recent Labs  Lab 04/21/20 1049 04/23/20 0348 04/24/20 0534 04/25/20 0426  NA 135 143 142 142  K 3.4* 3.2* 3.5 3.1*  CL 100 110 108 107  CO2 21* 22 23 26   GLUCOSE 158* 84 118* 99  BUN 20 20 18 15   CREATININE 0.67 0.49 0.54 0.42*  CALCIUM 9.7 8.7* 8.4* 8.4*  MG  --  2.2  --   --   PHOS  --  2.2*  --   --    GFR: Estimated Creatinine Clearance: 29.3 mL/min (A) (by C-G formula based on SCr of 0.42 mg/dL (L)). Liver Function Tests: Recent Labs  Lab 04/21/20 1049  AST 32  ALT 30  ALKPHOS 59  BILITOT 1.0  PROT 7.9  ALBUMIN 3.8   No results for input(s): LIPASE, AMYLASE in the last 168 hours. No results for input(s): AMMONIA in the last 168 hours. Coagulation Profile: Recent Labs  Lab 04/21/20 1049  INR 1.1   Cardiac Enzymes: No results for input(s): CKTOTAL, CKMB, CKMBINDEX, TROPONINI in the last 168 hours. BNP (last 3 results) No results for input(s): PROBNP in the last 8760 hours. HbA1C: No results for input(s): HGBA1C in the last 72 hours. CBG: No results for input(s):  GLUCAP in the last 168 hours. Lipid Profile: No results for input(s): CHOL, HDL, LDLCALC, TRIG, CHOLHDL, LDLDIRECT in the last 72 hours. Thyroid Function Tests: No results for input(s): TSH, T4TOTAL,  FREET4, T3FREE, THYROIDAB in the last 72 hours. Anemia Panel: Recent Labs    04/23/20 0348 04/24/20 0534  VITAMINB12  --  686  FERRITIN 110  --   TIBC 228*  --   IRON 13*  --    Sepsis Labs: No results for input(s): PROCALCITON, LATICACIDVEN in the last 168 hours.  Recent Results (from the past 240 hour(s))  Resp Panel by RT-PCR (Flu A&B, Covid) Nasopharyngeal Swab     Status: None   Collection Time: 04/21/20  2:09 PM   Specimen: Nasopharyngeal Swab; Nasopharyngeal(NP) swabs in vial transport medium  Result Value Ref Range Status   SARS Coronavirus 2 by RT PCR NEGATIVE NEGATIVE Final    Comment: (NOTE) SARS-CoV-2 target nucleic acids are NOT DETECTED.  The SARS-CoV-2 RNA is generally detectable in upper respiratory specimens during the acute phase of infection. The lowest concentration of SARS-CoV-2 viral copies this assay can detect is 138 copies/mL. A negative result does not preclude SARS-Cov-2 infection and should not be used as the sole basis for treatment or other patient management decisions. A negative result may occur with  improper specimen collection/handling, submission of specimen other than nasopharyngeal swab, presence of viral mutation(s) within the areas targeted by this assay, and inadequate number of viral copies(<138 copies/mL). A negative result must be combined with clinical observations, patient history, and epidemiological information. The expected result is Negative.  Fact Sheet for Patients:  BloggerCourse.com  Fact Sheet for Healthcare Providers:  SeriousBroker.it  This test is no t yet approved or cleared by the [place] FDA and  has been authorized for detection and/or diagnosis of SARS-CoV-2 by FDA under an Emergency Use Authorization (EUA). This EUA will remain  in effect (meaning this test can be used) for the duration of the COVID-19 declaration under Section 564(b)(1) of the Act,  21 U.S.C.section 360bbb-3(b)(1), unless the authorization is terminated  or revoked sooner.       Influenza A by PCR NEGATIVE NEGATIVE Final   Influenza B by PCR NEGATIVE NEGATIVE Final    Comment: (NOTE) The Xpert Xpress SARS-CoV-2/FLU/RSV plus assay is intended as an aid in the diagnosis of influenza from Nasopharyngeal swab specimens and should not be used as a sole basis for treatment. Nasal washings and aspirates are unacceptable for Xpert Xpress SARS-CoV-2/FLU/RSV testing.  Fact Sheet for Patients: BloggerCourse.com  Fact Sheet for Healthcare Providers: SeriousBroker.it  This test is not yet approved or cleared by the [place] FDA and has been authorized for detection and/or diagnosis of SARS-CoV-2 by FDA under an Emergency Use Authorization (EUA). This EUA will remain in effect (meaning this test can be used) for the duration of the COVID-19 declaration under Section 564(b)(1) of the Act, 21 U.S.C. section 360bbb-3(b)(1), unless the authorization is terminated or revoked.  Performed at [facility], [address], [place], [place] [address]   Urine Culture     Status: Abnormal   Collection Time: 04/21/20  8:16 PM   Specimen: Urine, Catheterized  Result Value Ref Range Status   Specimen Description   Final    URINE, CATHETERIZED Performed at [facility], [address] [place], [place] [address]    Special Requests   Final    NONE Performed at [facility], [address] [place] [place], [place] [address]  Culture >=100,000 COLONIES/mL ESCHERICHIA COLI (A)  Final   Report Status 04/24/2020 FINAL  Final   Organism ID, Bacteria ESCHERICHIA COLI (A)  Final      Susceptibility   Escherichia coli - MIC*    AMPICILLIN >=32 RESISTANT Resistant     CEFAZOLIN <=4 SENSITIVE Sensitive     CEFEPIME <=0.12 SENSITIVE Sensitive     CEFTRIAXONE <=0.25 SENSITIVE Sensitive     CIPROFLOXACIN  <=0.25 SENSITIVE Sensitive     GENTAMICIN <=1 SENSITIVE Sensitive     IMIPENEM <=0.25 SENSITIVE Sensitive     NITROFURANTOIN <=16 SENSITIVE Sensitive     TRIMETH/SULFA <=20 SENSITIVE Sensitive     AMPICILLIN/SULBACTAM >=32 RESISTANT Resistant     PIP/TAZO <=4 SENSITIVE Sensitive     * >=100,000 COLONIES/mL ESCHERICHIA COLI  MRSA PCR Screening     Status: None   Collection Time: 04/22/20  9:03 AM   Specimen: Nasal Mucosa; Nasopharyngeal  Result Value Ref Range Status   MRSA by PCR NEGATIVE NEGATIVE Final    Comment:        The GeneXpert MRSA Assay (FDA approved for NASAL specimens only), is one component of a comprehensive MRSA colonization surveillance program. It is not intended to diagnose MRSA infection nor to guide or monitor treatment for MRSA infections. Performed at [facility], [address] [place], [place] [address]      Radiology Studies: DG HIP OPERATIVE UNILAT W OR W/O PELVIS LEFT  Result Date: 04/23/2020 CLINICAL DATA:  Left IM nail for femur fracture. EXAM: OPERATIVE left HIP (WITH PELVIS IF PERFORMED) TECHNIQUE: Fluoroscopic spot image(s) were submitted for interpretation post-operatively. COMPARISON:  Preoperative radiograph 04/21/2020 FINDINGS: Three fluoroscopic spot views obtained in the operating room of the left hip in frontal and lateral projections. Intramedullary nail with trans trochanteric and distal locking screws fixating intertrochanteric femur fracture. Fracture is in improved alignment fluoroscopy time 46 seconds. IMPRESSION: Intraoperative fluoroscopy after ORIF of left intertrochanteric femur fracture. Electronically Signed   By: Narda RutherfordMelanie  Sanford M.D.   On: 04/23/2020 15:21     LOS: 4 days   Lanae Boastamesh Reshawn Ostlund, MD [facility]  04/25/2020, 10:22 AM

## 2020-04-25 NOTE — NC FL2 (Addendum)
[place] MEDICAID FL2 LEVEL OF CARE SCREENING TOOL     IDENTIFICATION  Patient Name: Christina Mooney Birthdate: 1925-12-29 Sex: female Admission Date (Current Location): 04/21/2020  [facility] and [place] Number:  Chiropodist and Address:  [facility], [address], [place], [place] [address]      Provider Number: [id number]  Attending Physician Name and Address:  Lanae Boast, MD  Relative Name and Phone Number:  Donelda, [place] / Son / [phone] /  [phone]    Current Level of Care: Hospital Recommended Level of Care: Skilled Nursing Facility Prior Approval Number:    Date Approved/Denied:   PASRR Number:  [id number]  Discharge Plan: SNF    Current Diagnoses: Patient Active Problem List   Diagnosis Date Noted   Iron deficiency anemia    Pre-op evaluation    Acute cystitis with hematuria    Scalp laceration    Constipation    Closed left subtrochanteric femur fracture (HCC) 04/21/2020   Protein-calorie malnutrition, severe 07/19/2019   Femur fracture, left (HCC) 07/18/2019   Closed fracture of left femur, unspecified fracture morphology, initial encounter (HCC) 07/18/2019   Pneumothorax on left    Pneumothorax, traumatic    Fall 02/27/2019   GERD (gastroesophageal reflux disease) 12/28/2015   Carotid artery stenosis 12/28/2015   Aortic atherosclerosis (HCC) 12/28/2015   Hyperlipidemia 12/28/2015   Essential hypertension, benign 12/28/2015   Fibromuscular dysplasia (HCC) 12/28/2015    Orientation RESPIRATION BLADDER Height & Weight     Self  O2 External catheter Weight: 91 lb (41.3 kg) Height:  4\' 11"  (149.9 cm)  BEHAVIORAL SYMPTOMS/MOOD NEUROLOGICAL BOWEL NUTRITION STATUS        Diet (regular)  AMBULATORY STATUS COMMUNICATION OF NEEDS Skin   Extensive Assist Verbally Bruising,Surgical wounds (surgical incision L hip)                       Personal Care Assistance Level of Assistance   Bathing,Feeding,Dressing Bathing Assistance: Maximum assistance Feeding assistance: Limited assistance Dressing Assistance: Maximum assistance     Functional Limitations Info             SPECIAL CARE FACTORS FREQUENCY  PT (By licensed PT),OT (By licensed OT)     PT Frequency: 5 x/week OT Frequency: 5 x/week            Contractures      Additional Factors Info  Code Status,Allergies Code Status Info: full code Allergies Info: codeine, garlic, milk related compounds, sorbitol           Current Medications (04/25/2020):  This is the current hospital active medication list Current Facility-Administered Medications  Medication Dose Route Frequency Provider Last Rate Last Admin   0.45 % sodium chloride infusion   Intravenous Continuous 04/27/2020, MD 75 mL/hr at 04/23/20 1834 New Bag at 04/23/20 1834   0.9 %  sodium chloride infusion (Manually program via Guardrails IV Fluids)   Intravenous Once Kc, 04/25/20, MD       acetaminophen (TYLENOL) tablet 325-650 mg  325-650 mg Oral Q6H PRN Dayna Barker, MD       acetaminophen (TYLENOL) tablet 650 mg  650 mg Oral TID Deeann Saint, MD   650 mg at 04/25/20 0909   alum & mag hydroxide-simeth (MAALOX/MYLANTA) 200-200-20 MG/5ML suspension 30 mL  30 mL Oral Q4H PRN 11-17-2000, MD       aspirin EC tablet 81 mg  81 mg Oral Daily Deeann Saint, MD   [phone]  mg at 04/25/20 0909   bisacodyl (DULCOLAX) EC tablet 5 mg  5 mg Oral Daily PRN Deeann Saint, MD       bisacodyl (DULCOLAX) suppository 10 mg  10 mg Rectal Daily PRN Deeann Saint, MD       bismuth subsalicylate (PEPTO BISMOL) 262 MG/15ML suspension 30 mL  30 mL Oral Q4H PRN Deeann Saint, MD       ceFAZolin (ANCEF) IVPB 1 g/50 mL premix  1 g Intravenous Q12H Katha Cabal, RPH 100 mL/hr at 04/25/20 0626 1 g at 04/25/20 5809   clopidogrel (PLAVIX) tablet 75 mg  75 mg Oral Daily Deeann Saint, MD   75 mg at 04/25/20 9833   diazepam (VALIUM) tablet 2 mg  2 mg Oral  Q8H PRN Deeann Saint, MD       docusate sodium (COLACE) capsule 100 mg  100 mg Oral BID Deeann Saint, MD   100 mg at 04/25/20 8250   feeding supplement (ENSURE ENLIVE / ENSURE PLUS) liquid 237 mL  237 mL Oral TID BM Deeann Saint, MD   237 mL at 04/25/20 0907   fentaNYL (SUBLIMAZE) injection 25-50 mcg  25-50 mcg Intravenous Q5 min PRN Deeann Saint, MD       ferrous sulfate tablet 325 mg  325 mg Oral Q breakfast Deeann Saint, MD   325 mg at 04/25/20 5397   HYDROcodone-acetaminophen (NORCO/VICODIN) 5-325 MG per tablet 1-2 tablet  1-2 tablet Oral Q4H PRN Deeann Saint, MD       hydroxypropyl methylcellulose / hypromellose (ISOPTO TEARS / GONIOVISC) 2.5 % ophthalmic solution 1 drop  1 drop Both Eyes Daily PRN Deeann Saint, MD       magnesium hydroxide (MILK OF MAGNESIA) suspension 30 mL  30 mL Oral Daily PRN Deeann Saint, MD   30 mL at 04/25/20 6734   menthol-cetylpyridinium (CEPACOL) lozenge 3 mg  1 lozenge Oral PRN Deeann Saint, MD       Or   phenol (CHLORASEPTIC) mouth spray 1 spray  1 spray Mouth/Throat PRN Deeann Saint, MD       metoCLOPramide (REGLAN) tablet 5-10 mg  5-10 mg Oral Q8H PRN Deeann Saint, MD       Or   metoCLOPramide (REGLAN) injection 5-10 mg  5-10 mg Intravenous Q8H PRN Deeann Saint, MD       metoprolol succinate (TOPROL-XL) 24 hr tablet 12.5 mg  12.5 mg Oral Daily Deeann Saint, MD   12.5 mg at 04/25/20 1937   morphine 2 MG/ML injection 0.5-1 mg  0.5-1 mg Intravenous Q2H PRN Deeann Saint, MD       ondansetron [facility]) tablet 4 mg  4 mg Oral Q6H PRN Deeann Saint, MD       Or   ondansetron [facility]) injection 4 mg  4 mg Intravenous Q6H PRN Deeann Saint, MD       pantoprazole (PROTONIX) EC tablet 40 mg  40 mg Oral Daily Deeann Saint, MD   40 mg at 04/25/20 0909   polyethylene glycol (MIRALAX / GLYCOLAX) packet 17 g  17 g Oral Daily Deeann Saint, MD   17 g at 04/25/20 0909   polyethylene glycol (MIRALAX / GLYCOLAX) packet 17 g  17 g  Oral Daily PRN Deeann Saint, MD       senna Mancel Parsons) tablet 8.6 mg  1 tablet Oral BID Deeann Saint, MD   8.6 mg at 04/25/20 0909   sodium chloride (OCEAN) 0.65 % nasal spray 1 spray  1 spray Each Nare Q2H PRN  Deeann Saint, MD       sodium phosphate (FLEET) 7-19 GM/118ML enema 1 enema  1 enema Rectal Once PRN Deeann Saint, MD       zolpidem Remus Loffler) tablet 5 mg  5 mg Oral QHS PRN Deeann Saint, MD         Discharge Medications: Please see discharge summary for a list of discharge medications.  Relevant Imaging Results:  Relevant Lab Results:   Additional Information SS #: [id number]  PASRR #: [id number]  Ed Mandich E Jlee Harkless, LCSW

## 2020-04-25 NOTE — Progress Notes (Signed)
Subjective: 2 Days Post-Op Procedure(s) (LRB): INTRAMEDULLARY (IM) NAIL INTERTROCHANTRIC (Left) Patient seems more alert today.  Pain is moderate.  She stood up at the bedside.  She came from [facility] skilled nursing and will be back there shortly.  Her son is present but is leaving town Advertising account executive.  She is doing well and stable.  Hemoglobin is stable.  Patient reports pain as mild.  Objective:   VITALS:   Vitals:   04/25/20 0444 04/25/20 0755  BP:  (!) 147/58  Pulse: 71 73  Resp:  15  Temp:  98.3 F (36.8 C)  SpO2: 97% 94%    Neurologically intact Intact pulses distally Incision: scant drainage  LABS Recent Labs    04/23/20 0348 04/24/20 0534 04/24/20 1648 04/25/20 0426  HGB 7.9* 7.1* 9.1* 9.6*  HCT 24.3* 21.8* 26.7* 27.5*  WBC 11.2* 10.4  --  9.8  PLT 254 259  --  284    Recent Labs    04/23/20 0348 04/24/20 0534 04/25/20 0426  NA 143 142 142  K 3.2* 3.5 3.1*  BUN 20 18 15   CREATININE 0.49 0.54 0.42*  GLUCOSE 84 118* 99    No results for input(s): LABPT, INR in the last 72 hours.   Assessment/Plan: 2 Days Post-Op Procedure(s) (LRB): INTRAMEDULLARY (IM) NAIL INTERTROCHANTRIC (Left)   Advance diet Up with therapy D/C IV fluids Discharge to SNF when stable.  Enteric-coated aspirin 81 mg and Plavix on discharge Partial weightbearing left leg Return to clinic 2 weeks for x-ray and staple removal

## 2020-04-25 NOTE — TOC Progression Note (Addendum)
Transition of Care [facility]) - Progression Note    Patient Details  Name: Christina Mooney MRN: [id number] Date of Birth: [birth year]  [facility] CM/SW Contact  Liliana Cline, LCSW Phone Number: 04/25/2020, 10:33 AM  Clinical Narrative:   PT recommending SNF rehab. CSW spoke with Christina [facility]oney at [facility] who reported they plan to take patient back as rehab then transition back to long term care when she finishes rehab. CSW will complete FL2.  11:15- Call to [facility] to confirm if patient is managed by Christina Mooney. Spoke to [employer]. He is checking and will call CSW back with answer.  1:00- [facility], spoke to Representative who reported patient is not managed by [employer]. CSW updated Christina Mooney with [facility] and asked her to start insurance auth. Per Dr. Jonathon Mooney patient should be medically ready to DC tomorrow. Attempted to call patient's son Christina Mooney to provide update, left a voicemail.  2:15- CSW provided update to patient's son Christina Mooney. Christina Mooney reported he is leaving to go back home (out of state) tomorrow, other son Christina Mooney will be coming in tomorrow night and will be available if anything is needed.   Expected Discharge Plan: Skilled Nursing Facility Barriers to Discharge: Continued Medical Work up  Expected Discharge Plan and Services Expected Discharge Plan: Skilled Nursing Facility       Living arrangements for the past 2 months: Skilled Nursing Facility                                       Social Determinants of Health (SDOH) Interventions    Readmission Risk Interventions No flowsheet data found.

## 2020-04-25 NOTE — Care Management Important Message (Signed)
Important Message  Patient Details  Name: Christina Mooney MRN: [id number] Date of Birth: 01/17/1926   Medicare Important Message Given:  Yes     [name] Baraa Tubbs 04/25/2020, 12:24 PM

## 2020-04-25 NOTE — Progress Notes (Signed)
Physical Therapy Treatment Patient Details Name: Christina Mooney MRN: [id number] DOB: 11/11/1925 Today's Date: 04/25/2020    History of Present Illness Pt is a 86 y.o. female presenting to hospital 12/12 s/p fall when in bathroom.  Pt sustained large laceration L parietal scalp.  Imaging showing displaced intertrochanteric fx L femur, acute appearing fx's of L pubic symphysis, and chronic fx deformities of B pubic rami.  Pt s/p L IMN intertrochantric 04/23/20.  PMH includes s/p ORIF distal L femur fx 07/19/19, carotid stenosis, htn, dementia, chronic back pain, IBS, PAD, and spinal stenosis of lumbar region with neurogenic claudication.    PT Comments    Patient alert, in bed agreeable to PT. Denied pain at rest, but with mobility exhibited significant pain signs/symptoms and referenced L hip and L flank/rib pain, RN notified of flank pain. Pt needed extended time to answer questions and follow commands, but behavior was WFLs. Supine to sit with maxA with cueing for technique to maximize participation. Sit <> stand with RW and mod-maxA performed twice. unable to come fully into standing; attempted with stand pivot set up, improved weight bearing of LE's noted, but pt unable to tolerate >5seconds due to increased L rib/flank pain. Returned to supine with totalA. Pt repositioned for comfort and all needs in reach at end of session. The patient would benefit from further skilled PT intervention to continue to progress towards goals. Recommendation remains appropriate.       Follow Up Recommendations  SNF     Equipment Recommendations  Rolling walker with 5" wheels;3in1 (PT);Wheelchair (measurements PT);Wheelchair cushion (measurements PT)    Recommendations for Other Services       Precautions / Restrictions Precautions Precautions: Fall Restrictions Weight Bearing Restrictions: Yes LLE Weight Bearing: Partial weight bearing    Mobility  Bed Mobility Overal bed mobility: Needs  Assistance Bed Mobility: Supine to Sit;Sit to Supine     Supine to sit: Max assist;HOB elevated Sit to supine: Total assist   General bed mobility comments: assist for trunk and B LE's  Transfers Overall transfer level: Needs assistance Equipment used: Rolling walker (2 wheeled) Transfers: Sit to/from [employer] Sit to Stand: Mod assist Stand pivot transfers: Max assist       General transfer comment: unable to come fully into standing with modA and RW; attempted with stand pivot set up, improved weight bearing of LE's noted, but pt unable to tolerate >5seconds due to increased L rib/flank pain  Ambulation/Gait             General Gait Details: deferred   Stairs             Wheelchair Mobility    Modified Rankin (Stroke Patients Only)       Balance Overall balance assessment: Needs assistance Sitting-balance support: Feet supported;Bilateral upper extremity supported Sitting balance-Leahy Scale: Fair Sitting balance - Comments: steady static sitting x5 minutes (close SBA for safety)     Standing balance-Leahy Scale: Zero                              Cognition Arousal/Alertness: Awake/alert Behavior During Therapy: Flat affect                                   General Comments: Pt able to follow commands, extended time needed for processing and answering      Exercises  General Comments General comments (skin integrity, edema, etc.): RN notified of pt complaints of L sided rib/flank pain      Pertinent Vitals/Pain Pain Assessment: Faces Faces Pain Scale: Hurts whole lot Pain Location: L hip/thigh, L flank/ribs Pain Descriptors / Indicators: Grimacing;Moaning;Guarding;Crying Pain Intervention(s): Limited activity within patient's tolerance;Monitored during session;Repositioned    Home Living                      Prior Function            PT Goals (current goals can now be found  in the care plan section) Progress towards PT goals: Progressing toward goals    Frequency    7X/week      PT Plan Current plan remains appropriate    Co-evaluation              AM-PAC PT "6 Clicks" Mobility   Outcome Measure  Help needed turning from your back to your side while in a flat bed without using bedrails?: A Lot Help needed moving from lying on your back to sitting on the side of a flat bed without using bedrails?: A Lot Help needed moving to and from a bed to a chair (including a wheelchair)?: Total Help needed standing up from a chair using your arms (e.g., wheelchair or bedside chair)?: Total Help needed to walk in hospital room?: Total Help needed climbing 3-5 steps with a railing? : Total 6 Click Score: 8    End of Session Equipment Utilized During Treatment: Oxygen (2L) Activity Tolerance: Patient limited by fatigue;Patient limited by pain Patient left: in bed;with call bell/phone within reach;with family/visitor present;with SCD's reapplied;Other (comment) Nurse Communication: Mobility status;Precautions;Weight bearing status PT Visit Diagnosis: Other abnormalities of gait and mobility (R26.89);Muscle weakness (generalized) (M62.81);History of falling (Z91.81);Difficulty in walking, not elsewhere classified (R26.2);Pain Pain - Right/Left: Left Pain - part of body: Hip     Time: 6599-3570 PT Time Calculation (min) (ACUTE ONLY): 23 min  Charges:  $Therapeutic Exercise: 23-37 mins                    Olga Coaster PT, DPT 11:52 AM,04/25/20

## 2020-04-26 DIAGNOSIS — Z7189 Other specified counseling: Secondary | ICD-10-CM

## 2020-04-26 DIAGNOSIS — Z515 Encounter for palliative care: Secondary | ICD-10-CM

## 2020-04-26 LAB — CBC
HCT: 29.7 % — ABNORMAL LOW (ref 36.0–46.0)
Hemoglobin: 10 g/dL — ABNORMAL LOW (ref 12.0–15.0)
MCH: 29.3 pg (ref 26.0–34.0)
MCHC: 33.7 g/dL (ref 30.0–36.0)
MCV: 87.1 fL (ref 80.0–100.0)
Platelets: 321 10*3/uL (ref 150–400)
RBC: 3.41 MIL/uL — ABNORMAL LOW (ref 3.87–5.11)
RDW: 14.4 % (ref 11.5–15.5)
WBC: 10.5 10*3/uL (ref 4.0–10.5)
nRBC: 0.3 % — ABNORMAL HIGH (ref 0.0–0.2)

## 2020-04-26 MED ORDER — CEPHALEXIN 500 MG PO CAPS
500.0000 mg | ORAL_CAPSULE | Freq: Two times a day (BID) | ORAL | Status: DC
  Administered 2020-04-26 – 2020-04-27 (×2): 500 mg via ORAL
  Filled 2020-04-26 (×2): qty 1

## 2020-04-26 MED ORDER — POTASSIUM CHLORIDE CRYS ER 20 MEQ PO TBCR
30.0000 meq | EXTENDED_RELEASE_TABLET | Freq: Once | ORAL | Status: AC
  Administered 2020-04-26: 15:00:00 30 meq via ORAL
  Filled 2020-04-26: qty 1

## 2020-04-26 NOTE — Consult Note (Signed)
Pharmacy Antibiotic Note  Christina Mooney is a 86 y.o. female admitted on 04/21/2020 with UTI.  Pharmacy has been consulted for Cefazolin dosing. Day 2 antibiotics.   Urine culture: E.coli (sensitive to cefazolin)  Plan: Will continue cefazolin from 2g Q8h to 1g Q12H x 5 total days (per consult) and follow up with MD on whether patient is a candidate for oral antibiotic.  Height: 4\' 11"  (149.9 cm) Weight: 41.3 kg (91 lb) IBW/kg (Calculated) : 43.2  Temp (24hrs), Avg:98 F (36.7 C), Min:97.6 F (36.4 C), Max:98.1 F (36.7 C)  Recent Labs  Lab 04/21/20 1049 04/23/20 0348 04/24/20 0534 04/25/20 0426 04/26/20 0428  WBC 11.1* 11.2* 10.4 9.8 10.5  CREATININE 0.67 0.49 0.54 0.42*  --     Estimated Creatinine Clearance: 29.3 mL/min (A) (by C-G formula based on SCr of 0.42 mg/dL (L)).    Allergies  Allergen Reactions  . Codeine Nausea And Vomiting  . Garlic Other (See Comments)    indigestion  . Milk-Related Compounds Diarrhea and Other (See Comments)    Diarrhea/cramping  . Sorbitol Diarrhea and Other (See Comments)    Diarrhea/cramping    Antimicrobials this admission: 12/13 Keflex >> 12/13 12/14 Cefazolin >>   Microbiology results: 12/12 UCx: E.coli 12/13 MRSA PCR: (-)  Thank you for allowing pharmacy to be a part of this patient's care.  1/14 04/26/2020 2:17 PM

## 2020-04-26 NOTE — Progress Notes (Signed)
Patient's son, Nyjah Schwake called for update on discharge.

## 2020-04-26 NOTE — Progress Notes (Signed)
Subjective: 3 Days Post-Op Procedure(s) (LRB): INTRAMEDULLARY (IM) NAIL INTERTROCHANTRIC (Left)   Patient is stable and doing well.  Feels comfortable.  Answers questions appropriately.  Slow progress with PT is very weak.  Ready for skilled nursing discharge from orthopedic viewpoint.  Patient reports pain as mild.  Objective:   Next VITALS:   Vitals:   04/26/20 0839 04/26/20 1118  BP: (!) 159/84 116/60  Pulse: 81 75  Resp: 18 16  Temp: 98.1 F (36.7 C) 98.1 F (36.7 C)  SpO2: 100% 100%    Neurologically intact Dorsiflexion/Plantar flexion intact Incision: scant drainage  LABS Recent Labs    04/24/20 0534 04/24/20 1648 04/25/20 0426 04/26/20 0428  HGB 7.1* 9.1* 9.6* 10.0*  HCT 21.8* 26.7* 27.5* 29.7*  WBC 10.4  --  9.8 10.5  PLT 259  --  284 321    Recent Labs    04/24/20 0534 04/25/20 0426  NA 142 142  K 3.5 3.1*  BUN 18 15  CREATININE 0.54 0.42*  GLUCOSE 118* 99    No results for input(s): LABPT, INR in the last 72 hours.   Assessment/Plan: 3 Days Post-Op Procedure(s) (LRB): INTRAMEDULLARY (IM) NAIL INTERTROCHANTRIC (Left)   Up with therapy Discharge to SNF unstable.  Aspirin and Plavix Partial weightbearing left leg Return to clinic 2 weeks for x-ray and staple removal

## 2020-04-26 NOTE — TOC Progression Note (Addendum)
Transition of Care [facility]) - Progression Note    Patient Details  Name: Christina Mooney MRN: [id number] Date of Birth: 1925-05-29  Transition [facility]) CM/SW Contact  Liliana Cline, LCSW Phone Number: 04/26/2020, 1:41 PM  Clinical Narrative:   CSW reached out to [place] with [facility], inquired about insurance auth status. No auth obtained yet.    Expected Discharge Plan: Skilled Nursing Facility Barriers to Discharge: Continued Medical Work up  Expected Discharge Plan and Services Expected Discharge Plan: Skilled Nursing Facility       Living arrangements for the past 2 months: Skilled Nursing Facility                                       Social Determinants of Health (SDOH) Interventions    Readmission Risk Interventions No flowsheet data found.

## 2020-04-26 NOTE — Progress Notes (Signed)
Physical Therapy Treatment Patient Details Name: Christina Mooney MRN: [id number] DOB: 02/07/1926 Today's Date: 04/26/2020    History of Present Illness Pt is a 86 y.o. female presenting to hospital 12/12 s/p fall when in bathroom.  Pt sustained large laceration L parietal scalp.  Imaging showing displaced intertrochanteric fx L femur, acute appearing fx's of L pubic symphysis, and chronic fx deformities of B pubic rami.  Pt s/p L IMN intertrochantric 04/23/20.  PMH includes s/p ORIF distal L femur fx 07/19/19, carotid stenosis, htn, dementia, chronic back pain, IBS, PAD, and spinal stenosis of lumbar region with neurogenic claudication.    PT Comments    Participated in exercises as described below supine and sitting.  She is able to transition to sitting with max a x 1.  Sat EOB x 5 minutes with min assist/guard.  Unsafe to be left unattended.  She is able to stand x 1 with mod a x 1 and unable to come to a complete standing relying on writer and legs on bed for support.  Returned to supine with dependent assist.    She does voice several times during session that she is scared.  Encouragement given.  Overall tolerated better today without c/o flank pain.  She has limited activity tolerance and after one stand does not feel as she could do another or transition to recliner.   Follow Up Recommendations  SNF     Equipment Recommendations  Rolling walker with 5" wheels;3in1 (PT);Wheelchair (measurements PT);Wheelchair cushion (measurements PT)    Recommendations for Other Services       Precautions / Restrictions Precautions Precautions: Fall Restrictions Weight Bearing Restrictions: Yes LLE Weight Bearing: Partial weight bearing    Mobility  Bed Mobility Overal bed mobility: Needs Assistance Bed Mobility: Supine to Sit;Sit to Supine     Supine to sit: Max assist;HOB elevated Sit to supine: Total assist   General bed mobility comments: assist for trunk and B  LE's  Transfers Overall transfer level: Needs assistance Equipment used: Rolling walker (2 wheeled) Transfers: Sit to/from Stand Sit to Stand: Mod assist            Ambulation/Gait             General Gait Details: unable   Stairs             Wheelchair Mobility    Modified Rankin (Stroke Patients Only)       Balance Overall balance assessment: Needs assistance Sitting-balance support: Feet supported;Bilateral upper extremity supported Sitting balance-Leahy Scale: Fair     Standing balance support: Bilateral upper extremity supported Standing balance-Leahy Scale: Poor                              Cognition Arousal/Alertness: Awake/alert Behavior During Therapy: WFL for tasks assessed/performed Overall Cognitive Status: Within Functional Limits for tasks assessed                                        Exercises General Exercises - Lower Extremity Ankle Circles/Pumps: AAROM;Strengthening;Both;10 reps;Supine Short Arc Quad: AROM;Both;10 reps;Seated Heel Slides: AAROM;Strengthening;Left;10 reps;Supine Hip ABduction/ADduction: AAROM;Strengthening;Left;10 reps;Supine Hip Flexion/Marching: AROM;Seated;Both;10 reps Other Exercises Other Exercises: sat EOB x 5 minutes before standing    General Comments        Pertinent Vitals/Pain Pain Assessment: Faces Faces Pain Scale: Hurts a little bit Pain Location:  L hip/thigh, L flank/ribs Pain Descriptors / Indicators: Grimacing;Moaning;Guarding;Crying Pain Intervention(s): Limited activity within patient's tolerance;Monitored during session;Repositioned    Home Living                      Prior Function            PT Goals (current goals can now be found in the care plan section) Progress towards PT goals: Progressing toward goals    Frequency    7X/week      PT Plan Current plan remains appropriate    Co-evaluation              AM-PAC PT "6  Clicks" Mobility   Outcome Measure  Help needed turning from your back to your side while in a flat bed without using bedrails?: A Lot Help needed moving from lying on your back to sitting on the side of a flat bed without using bedrails?: Total Help needed moving to and from a bed to a chair (including a wheelchair)?: Total Help needed standing up from a chair using your arms (e.g., wheelchair or bedside chair)?: A Lot Help needed to walk in hospital room?: Total Help needed climbing 3-5 steps with a railing? : Total 6 Click Score: 8    End of Session Equipment Utilized During Treatment: Gait belt;Oxygen Activity Tolerance: Patient limited by fatigue Patient left: in bed;with call bell/phone within reach;with SCD's reapplied Nurse Communication: Mobility status;Precautions;Weight bearing status Pain - Right/Left: Left Pain - part of body: Hip     Time: 2992-4268 PT Time Calculation (min) (ACUTE ONLY): 24 min  Charges:  $Therapeutic Exercise: 8-22 mins $Therapeutic Activity: 8-22 mins                    Danielle Dess, PTA 04/26/20, 10:16 AM

## 2020-04-26 NOTE — Progress Notes (Signed)
PROGRESS NOTE    Christina BarriosFrances S Mooney  ZOX:[id number] DOB: 15-Jan-1926 DOA: 04/21/2020 PCP: Karie SchwalbeLetvak, Richard I, MD   Chief Complaint  Patient presents with  . Fall    Brief Narrative: 86 year old female with history of GERD, dementia, carotid artery stenosis on Plavix, hyperlipidemia, hypertension, fibromuscular dysplasia presented to the ED after a fall at the skilled nursing facility after she tripped and fell on her left side. In the ED vitals are stable,Labs include: Sodium 135, potassium 3.4, chloride 100, bicarb 21, glucose 158, BUN 20, creatinine 0.67, calcium 9.7, and anion gap 14, alkaline phosphatase 59, AST 32, ALT 30, total bilirubin 1.0, WBC 11.1, hemoglobin 12.2, hematocrit 36.7, platelet 349, influenza negative, Covid negative. CT head: Marland Kitchen. Laceration is seen involving the left parietal scalp near the vertex with overlying hematoma. 2. Mild diffuse cortical atrophy. Mild chronic ischemic white matter disease. No acute intracranial abnormality seen. CT hip left: There is a displaced intertrochanteric fracture of the left femur. There are additional fractures of the left pubic symphysis, which appear acute, and additional chronic fracture deformities of the bilateral pubic rami, which were present on prior radiographs. Partially imaged plate and screw fixation of the distal left femur. Osteopenia. Seen by orthopedic and underwent operative intervention on 04/23/20. Postop acute blood loss anemia noted 12/15 needing 1 unit PRBC  Subjective: Seen this morning patient reports feeling very weak, and having difficulty with swallowing. No other acute events.  Nasal cannula oxygen in place, blood work showed low potassium hemoglobin improved to 10 g. Has some shortness of breath  Assessment & Plan:  Closed left subtrochanteric femur fracture status post operative repair 12/14 by orthopedics.  Appears very weak and deconditioned frail.  Encourage PT OT.  Very slow to progress.  On aspirin  Plavix for DVT prophylaxis as per orthopedics.   Anemia multifactorial from acute blood loss in the setting of Plavix/hip fracture and also with iron deficiency: As low as 7.1 g patient was transfused 1 unit PRBC .hemoglobin has improved 10 g. At baseline around 8 to 9 g.   Recent Labs  Lab 04/23/20 0348 04/24/20 0534 04/24/20 1648 04/25/20 0426 04/26/20 0428  HGB 7.9* 7.1* 9.1* 9.6* 10.0*  HCT 24.3* 21.8* 26.7* 27.5* 29.7*   Adult failure to thrive with severe protein calorie malnutrition with mild dementia/cognitive impairment and decline over past years/physical deconditioning, poor po intake: Son assisting closely. Continue supportive measures PT OT.  Will request palliative care input.  Overall prognosis does not appear bright in this elderly and frail lady.    Acute cystitis/UTI with E. Coli-continue with Ancef will complete 5 days course.    Scalp laceration from fall stapled in the ER. Cont wound care.  Constipation continue stool softener MiraLAX Colace.    Essential hypertension: BP is controlled, continue Toprol   Neuropathy cont  gabapentin  GERD: PPI to cont  Carotid artery stenosis/Aortic atherosclerosis/Hyperlipidemia- on plavix and resumed post op  Hypokalemia we will replete  Goals of care: briefly discussed w/ son 12/16-remains full code.  Given overall frailty old age and co-morbidities, requested Palliative care eval.  Nutrition: Diet Order            DIET DYS 3 Room service appropriate? Yes with Assist; Fluid consistency: Thin  Diet effective now                 Nutrition Problem: Severe Malnutrition Etiology: social / environmental circumstances (advanced age, suspected inadequate oral intake) Signs/Symptoms: severe fat depletion,severe muscle depletion Interventions: Refer  to RD note for recommendations  Body mass index is 18.38 kg/m.  DVT prophylaxis: SCDs Start: 04/23/20 1702 asa/plavix Code Status:   Code Status: Full Code  Family  Communication: plan of care discussed with patient and son at bedside.  Status is: Inpatient Remains inpatient appropriate because:Ongoing active pain requiring inpatient pain management and Inpatient level of care appropriate due to severity of illness  Dispo: The patient is from: [facility]              Anticipated d/c is to: SNF-same place              Anticipated d/c date is: 1 day once appear more stable, Palliative care eval today              Patient currently is not medically stable to d/c.  Consultants:see note  Procedures:see note  Culture/Microbiology    Component Value Date/Time   SDES  04/21/2020 2016    URINE, CATHETERIZED Performed at [facility], [address] [place], [place] [address]    [facility]  04/21/2020 2016    NONE Performed at [facility], [address] [place], [place] [address]    CULT >=100,000 COLONIES/mL ESCHERICHIA COLI (A) 04/21/2020 2016   REPTSTATUS 04/24/2020 FINAL 04/21/2020 2016    Other culture-see note  Medications: Scheduled Meds: . sodium chloride   Intravenous Once  . acetaminophen  650 mg Oral TID  . aspirin EC  81 mg Oral Daily  . clopidogrel  75 mg Oral Daily  . docusate sodium  100 mg Oral BID  . feeding supplement  237 mL Oral TID BM  . ferrous sulfate  325 mg Oral Q breakfast  . metoprolol succinate  12.5 mg Oral Daily  . pantoprazole  40 mg Oral Daily  . polyethylene glycol  17 g Oral Daily  . senna  1 tablet Oral BID   Continuous Infusions: . sodium chloride 75 mL/hr at 04/23/20 1834  .  ceFAZolin (ANCEF) IV 1 g (04/26/20 0540)    Antimicrobials: Anti-infectives (From admission, onward)   Start     Dose/Rate Route Frequency Ordered Stop   04/24/20 1800  ceFAZolin (ANCEF) IVPB 1 g/50 mL premix        1 g 100 mL/hr over 30 Minutes Intravenous Every 12 hours 04/24/20 0935 04/28/20 1759   04/24/20 1400  ceFAZolin (ANCEF) IVPB 1 g/50 mL premix  Status:  Discontinued        1 g 100  mL/hr over 30 Minutes Intravenous Every 8 hours 04/24/20 0932 04/24/20 0933   04/24/20 0600  ceFAZolin (ANCEF) IVPB 2g/100 mL premix        2 g 200 mL/hr over 30 Minutes Intravenous On call to O.R. 04/23/20 1222 04/23/20 1345   04/24/20 0600  clindamycin (CLEOCIN) IVPB 600 mg        600 mg 100 mL/hr over 30 Minutes Intravenous On call to O.R. 04/23/20 1222 04/23/20 1340   04/23/20 2200  ceFAZolin (ANCEF) IVPB 2g/100 mL premix  Status:  Discontinued        2 g 200 mL/hr over 30 Minutes Intravenous Every 8 hours 04/23/20 1702 04/24/20 0932   04/23/20 2200  clindamycin (CLEOCIN) IVPB 600 mg        600 mg 100 mL/hr over 30 Minutes Intravenous Every 8 hours 04/23/20 1702 04/24/20 1703   04/23/20 1341  gentamicin 80 mg in 0.9% sodium chloride 250 mL irrigation  Status:  Discontinued  As needed 04/23/20 1406 04/23/20 1434   04/23/20 1230  clindamycin (CLEOCIN) 600 MG/50ML IVPB       Note to Pharmacy: Renie Ora   : cabinet override      04/23/20 1230 04/23/20 1326   04/23/20 1230  ceFAZolin (ANCEF) 2-4 GM/100ML-% IVPB       Note to Pharmacy: Renie Ora   : cabinet override      04/23/20 1230 04/23/20 1326   04/22/20 1400  cephALEXin (KEFLEX) capsule 250 mg  Status:  Discontinued        250 mg Oral Every 8 hours 04/22/20 1230 04/23/20 1702   04/22/20 0600  ceFAZolin (ANCEF) IVPB 2g/100 mL premix        2 g 200 mL/hr over 30 Minutes Intravenous On call to O.R. 04/21/20 1429 04/23/20 0559   04/22/20 0600  clindamycin (CLEOCIN) IVPB 600 mg        600 mg 100 mL/hr over 30 Minutes Intravenous On call to O.R. 04/21/20 1429 04/23/20 0559     Objective: Vitals: Today's Vitals   04/26/20 0309 04/26/20 0457 04/26/20 0839 04/26/20 1118  BP:  (!) 159/60 (!) 159/84 116/60  Pulse:  76 81 75  Resp:  16 18 16   Temp:  98 F (36.7 C) 98.1 F (36.7 C) 98.1 F (36.7 C)  TempSrc:      SpO2:  100% 100% 100%  Weight:      Height:      PainSc: Asleep       Intake/Output Summary  (Last 24 hours) at 04/26/2020 1313 Last data filed at 04/26/2020 0453 Gross per 24 hour  Intake --  Output 750 ml  Net -750 ml   Filed Weights   04/21/20 1046  Weight: 41.3 kg   Weight change:   Intake/Output from previous day: 12/16 0701 - 12/17 0700 In: -  Out: 750 [Urine:750] Intake/Output this shift: No intake/output data recorded.  Examination: General exam: AAO frail, elderly,NAD, weak appearing. HEENT:Oral mucosa moist, Ear/Nose WNL grossly, dentition normal. Respiratory system: bilaterally clear,no wheezing or crackles,no use of accessory muscle Cardiovascular system: S1 & S2 +, No JVD,. Gastrointestinal system: Abdomen soft, NT,ND, BS+ Nervous System:Alert, awake, moving extremities and grossly nonfocal Extremities: No edema, distal peripheral pulses palpable.  Left hip surgical site dressing intact Skin: No rashes,no icterus. MSK: Normal muscle bulk,tone, power  Data Reviewed: I have personally reviewed following labs and imaging studies CBC: Recent Labs  Lab 04/21/20 1049 04/23/20 0348 04/24/20 0534 04/24/20 1648 04/25/20 0426 04/26/20 0428  WBC 11.1* 11.2* 10.4  --  9.8 10.5  NEUTROABS 10.0*  --   --   --   --   --   HGB 12.2 7.9* 7.1* 9.1* 9.6* 10.0*  HCT 36.7 24.3* 21.8* 26.7* 27.5* 29.7*  MCV 86.6 86.2 87.2  --  85.7 87.1  PLT 349 254 259  --  284 321   Basic Metabolic Panel: Recent Labs  Lab 04/21/20 1049 04/23/20 0348 04/24/20 0534 04/25/20 0426  NA 135 143 142 142  K 3.4* 3.2* 3.5 3.1*  CL 100 110 108 107  CO2 21* 22 23 26   GLUCOSE 158* 84 118* 99  BUN 20 20 18 15   CREATININE 0.67 0.49 0.54 0.42*  CALCIUM 9.7 8.7* 8.4* 8.4*  MG  --  2.2  --   --   PHOS  --  2.2*  --   --    GFR: Estimated Creatinine Clearance: 29.3 mL/min (A) (by C-G formula based on SCr  of 0.42 mg/dL (L)). Liver Function Tests: Recent Labs  Lab 04/21/20 1049  AST 32  ALT 30  ALKPHOS 59  BILITOT 1.0  PROT 7.9  ALBUMIN 3.8   No results for input(s):  LIPASE, AMYLASE in the last 168 hours. No results for input(s): AMMONIA in the last 168 hours. Coagulation Profile: Recent Labs  Lab 04/21/20 1049  INR 1.1   Cardiac Enzymes: No results for input(s): CKTOTAL, CKMB, CKMBINDEX, TROPONINI in the last 168 hours. BNP (last 3 results) No results for input(s): PROBNP in the last 8760 hours. HbA1C: No results for input(s): HGBA1C in the last 72 hours. CBG: No results for input(s): GLUCAP in the last 168 hours. Lipid Profile: No results for input(s): CHOL, HDL, LDLCALC, TRIG, CHOLHDL, LDLDIRECT in the last 72 hours. Thyroid Function Tests: No results for input(s): TSH, T4TOTAL, FREET4, T3FREE, THYROIDAB in the last 72 hours. Anemia Panel: Recent Labs    04/24/20 0534  VITAMINB12 686   Sepsis Labs: No results for input(s): PROCALCITON, LATICACIDVEN in the last 168 hours.  Recent Results (from the past 240 hour(s))  Resp Panel by RT-PCR (Flu A&B, Covid) Nasopharyngeal Swab     Status: None   Collection Time: 04/21/20  2:09 PM   Specimen: Nasopharyngeal Swab; Nasopharyngeal(NP) swabs in vial transport medium  Result Value Ref Range Status   SARS Coronavirus 2 by RT PCR NEGATIVE NEGATIVE Final    Comment: (NOTE) SARS-CoV-2 target nucleic acids are NOT DETECTED.  The SARS-CoV-2 RNA is generally detectable in upper respiratory specimens during the acute phase of infection. The lowest concentration of SARS-CoV-2 viral copies this assay can detect is 138 copies/mL. A negative result does not preclude SARS-Cov-2 infection and should not be used as the sole basis for treatment or other patient management decisions. A negative result may occur with  improper specimen collection/handling, submission of specimen other than nasopharyngeal swab, presence of viral mutation(s) within the areas targeted by this assay, and inadequate number of viral copies(<138 copies/mL). A negative result must be combined with clinical observations, patient  history, and epidemiological information. The expected result is Negative.  Fact Sheet for Patients:  BloggerCourse.com  Fact Sheet for Healthcare Providers:  SeriousBroker.it  This test is no t yet approved or cleared by the [place] FDA and  has been authorized for detection and/or diagnosis of SARS-CoV-2 by FDA under an Emergency Use Authorization (EUA). This EUA will remain  in effect (meaning this test can be used) for the duration of the COVID-19 declaration under Section 564(b)(1) of the Act, 21 U.S.C.section 360bbb-3(b)(1), unless the authorization is terminated  or revoked sooner.       Influenza A by PCR NEGATIVE NEGATIVE Final   Influenza B by PCR NEGATIVE NEGATIVE Final    Comment: (NOTE) The Xpert Xpress SARS-CoV-2/FLU/RSV plus assay is intended as an aid in the diagnosis of influenza from Nasopharyngeal swab specimens and should not be used as a sole basis for treatment. Nasal washings and aspirates are unacceptable for Xpert Xpress SARS-CoV-2/FLU/RSV testing.  Fact Sheet for Patients: BloggerCourse.com  Fact Sheet for Healthcare Providers: SeriousBroker.it  This test is not yet approved or cleared by the [place] FDA and has been authorized for detection and/or diagnosis of SARS-CoV-2 by FDA under an Emergency Use Authorization (EUA). This EUA will remain in effect (meaning this test can be used) for the duration of the COVID-19 declaration under Section 564(b)(1) of the Act, 21 U.S.C. section 360bbb-3(b)(1), unless the authorization is terminated or revoked.  Performed at [facility], [address] [place], [place] [address]   Urine Culture     Status: Abnormal   Collection Time: 04/21/20  8:16 PM   Specimen: Urine, Catheterized  Result Value Ref Range Status   Specimen Description   Final    URINE, CATHETERIZED Performed at  [facility], [address] [place], [place] [address]    Special Requests   Final    NONE Performed at [facility], [address] [place], [place] [address]    Culture >=100,000 COLONIES/mL ESCHERICHIA COLI (A)  Final   Report Status 04/24/2020 FINAL  Final   Organism ID, Bacteria ESCHERICHIA COLI (A)  Final      Susceptibility   Escherichia coli - MIC*    AMPICILLIN >=32 RESISTANT Resistant     CEFAZOLIN <=4 SENSITIVE Sensitive     CEFEPIME <=0.12 SENSITIVE Sensitive     CEFTRIAXONE <=0.25 SENSITIVE Sensitive     CIPROFLOXACIN <=0.25 SENSITIVE Sensitive     GENTAMICIN <=1 SENSITIVE Sensitive     IMIPENEM <=0.25 SENSITIVE Sensitive     NITROFURANTOIN <=16 SENSITIVE Sensitive     TRIMETH/SULFA <=20 SENSITIVE Sensitive     AMPICILLIN/SULBACTAM >=32 RESISTANT Resistant     PIP/TAZO <=4 SENSITIVE Sensitive     * >=100,000 COLONIES/mL ESCHERICHIA COLI  MRSA PCR Screening     Status: None   Collection Time: 04/22/20  9:03 AM   Specimen: Nasal Mucosa; Nasopharyngeal  Result Value Ref Range Status   MRSA by PCR NEGATIVE NEGATIVE Final    Comment:        The GeneXpert MRSA Assay (FDA approved for NASAL specimens only), is one component of a comprehensive MRSA colonization surveillance program. It is not intended to diagnose MRSA infection nor to guide or monitor treatment for MRSA infections. Performed at [facility], [address] [place], [place] [address]      Radiology Studies: No results found.   LOS: 5 days   Lanae Boast, MD [facility]  04/26/2020, 1:13 PM

## 2020-04-26 NOTE — Consult Note (Signed)
Consultation Note Date: 04/26/2020   Patient Name: Christina Mooney  DOB: 1926/03/11  MRN: [id number]  Age / Sex: 86 y.o., female  PCP: Karie Schwalbe, MD Referring Physician: Lanae Boast, MD  Reason for Consultation: Establishing goals of care  HPI/Patient Profile:Christina Mooney is a 86 y.o. female who complains of left hip pain after a fall at her nursing home the morning of admission.  She was brought to the emergency room where exam and x-rays reveal a comminuted displaced left intertrochanteric hip fracture. She underwent IMN of the hip.   Clinical Assessment and Goals of Care: Patient is resting in bed; no family at bedside. She states her name, the year of 2021, that she is at the hospital because she fell and broke her hip; she tells me she has broken the hip previously following a fall.  She states she lives at [facility]. She states she is widowed. She tells me she has 2 sons.   She states there are activities to do at the facility. She tells me she uses a walker. Ms. Pau tells me she will return to the facility for rehab. We broached GOC. She wants to continue to treat the treatable. She states she would not ever want a feeding tube, but would possibly be willing to accept one to keep her alive. She states she would probably not want to be placed on a ventilator or have CPR, but states those are difficult decisions she has thought about, but has not definitively decided against the interventions. Ms. Lannom states her sons would be her HPOA's, and work together. She states she needs to consider these decisions.     SUMMARY OF RECOMMENDATIONS   Patient would like to consider GOC. Recommend palliative outpatient as she would benefit from GOC discussions with her family present.   Prognosis:   Poor overall.       Primary Diagnoses: Present on Admission: . Closed left subtrochanteric femur  fracture (HCC) . GERD (gastroesophageal reflux disease) . Carotid artery stenosis . Aortic atherosclerosis (HCC) . Hyperlipidemia . Essential hypertension, benign . Fibromuscular dysplasia (HCC)   I have reviewed the medical record, interviewed the patient and family, and examined the patient. The following aspects are pertinent.  Past Medical History:  Diagnosis Date  . Chronic back pain   . Diverticulosis   . GERD (gastroesophageal reflux disease)   . Hyperlipidemia   . Hypertension   . Irritable bowel syndrome   . Lumbago   . Peripheral arterial disease (HCC)   . Spinal stenosis of lumbar region with neurogenic claudication    Social History   Socioeconomic History  . Marital status: Widowed    Spouse name: Not on file  . Number of children: Not on file  . Years of education: Not on file  . Highest education level: Not on file  Occupational History  . Not on file  Tobacco Use  . Smoking status: Never Smoker  . Smokeless tobacco: Never Used  Vaping Use  . Vaping Use:  Never used  Substance and Sexual Activity  . Alcohol use: No  . Drug use: No  . Sexual activity: Never  Other Topics Concern  . Not on file  Social History Narrative  . Not on file   Social Determinants of Health   Financial Resource Strain: Not on file  Food Insecurity: Not on file  Transportation Needs: Not on file  Physical Activity: Not on file  Stress: Not on file  Social Connections: Not on file   Family History  Problem Relation Age of Onset  . Congestive Heart Failure Mother   . Cerebrovascular Accident Father    Scheduled Meds: . sodium chloride   Intravenous Once  . acetaminophen  650 mg Oral TID  . aspirin EC  81 mg Oral Daily  . cephALEXin  500 mg Oral Q12H  . clopidogrel  75 mg Oral Daily  . docusate sodium  100 mg Oral BID  . feeding supplement  237 mL Oral TID BM  . ferrous sulfate  325 mg Oral Q breakfast  . metoprolol succinate  12.5 mg Oral Daily  . pantoprazole   40 mg Oral Daily  . polyethylene glycol  17 g Oral Daily  . senna  1 tablet Oral BID   Continuous Infusions: . sodium chloride 75 mL/hr at 04/23/20 1834   PRN Meds:.acetaminophen, alum & mag hydroxide-simeth, bisacodyl, bisacodyl, bismuth subsalicylate, diazepam, fentaNYL (SUBLIMAZE) injection, HYDROcodone-acetaminophen, hydroxypropyl methylcellulose / hypromellose, magnesium hydroxide, menthol-cetylpyridinium **OR** phenol, metoCLOPramide **OR** metoCLOPramide (REGLAN) injection, morphine injection, ondansetron **OR** ondansetron (ZOFRAN) IV, polyethylene glycol, sodium chloride, sodium phosphate, zolpidem Medications Prior to Admission:  Prior to Admission medications   Medication Sig Start Date End Date Taking? Authorizing Provider  acetaminophen (TYLENOL) 325 MG tablet Take 650 mg by mouth 3 (three) times daily.   Yes [provider]  acetaminophen (TYLENOL) 500 MG tablet Take 1,000 mg by mouth every 12 (twelve) hours as needed for mild pain (discomfort).   Yes [provider]  alum & mag hydroxide-simeth (MAALOX PLUS) 400-400-40 MG/5ML suspension Take 30 mLs by mouth every 4 (four) hours as needed for indigestion (gas/heartburn).   Yes [provider]  ARTIFICIAL TEAR SOLUTION OP Apply 1 drop to eye every 8 (eight) hours as needed (dry eyes).   Yes [provider]  aspirin EC 81 MG tablet Take 81 mg by mouth daily. Swallow whole.   Yes [provider]  bisacodyl (DULCOLAX) 10 MG suppository Place 10 mg rectally daily as needed for moderate constipation.   Yes [provider]  bismuth subsalicylate (PEPTO BISMOL) 262 MG/15ML suspension Take 30 mLs by mouth every 4 (four) hours as needed for diarrhea or loose stools.   Yes [provider]  cholecalciferol (VITAMIN D3) 10 MCG (400 UNIT) TABS tablet Take 400 Units by mouth daily.    Yes [provider]  clopidogrel (PLAVIX) 75 MG tablet TAKE 1 TABLET BY MOUTH DAILY Patient  taking differently: Take 75 mg by mouth daily. 05/02/19  Yes Schnier, Latina Craver, MD  esomeprazole (NEXIUM) 20 MG capsule Take 20 mg by mouth daily.   Yes [provider]  gabapentin (NEURONTIN) 100 MG capsule Take 100 mg by mouth at bedtime.   Yes [provider]  metoprolol succinate (TOPROL-XL) 25 MG 24 hr tablet Take 12.5 mg by mouth daily. 02/22/17  Yes [provider]  Multiple Vitamin (MULTIVITAMIN WITH MINERALS) TABS tablet Take 1 tablet by mouth daily. Patient taking differently: Take 1 tablet by mouth at bedtime. 07/22/19  Yes Enedina Finner, MD  Polyethyl Glycol-Propyl Glycol (SYSTANE) 0.4-0.3 % GEL ophthalmic gel Place 1 application into both eyes daily as needed (dry eyes).   Yes [provider]  polyethylene glycol (MIRALAX / GLYCOLAX) 17 g packet Take 17 g by mouth every other day.   Yes [provider]  polyethylene glycol (MIRALAX / GLYCOLAX) 17 g packet Take 17 g by mouth daily as needed for mild constipation.   Yes [provider]  Propylene Glycol (SYSTANE COMPLETE) 0.6 % SOLN Apply 1 drop to eye every 8 (eight) hours as needed (dry eyes).   Yes [provider]  sodium chloride (OCEAN) 0.65 % SOLN nasal spray Place 1 spray into both nostrils every 2 (two) hours as needed (dry nasal passages).   Yes [provider]  traMADol (ULTRAM) 50 MG tablet Take 1 tablet (50 mg total) by mouth every 6 (six) hours as needed for moderate pain. Patient taking differently: Take 50 mg by mouth every 4 (four) hours as needed (pain/discomfort). 07/21/19  Yes Enedina Finner, MD   Allergies  Allergen Reactions  . Codeine Nausea And Vomiting  . Garlic Other (See Comments)    indigestion  . Milk-Related Compounds Diarrhea and Other (See Comments)    Diarrhea/cramping  . Sorbitol Diarrhea and Other (See Comments)    Diarrhea/cramping   Review of Systems  All other systems reviewed and are negative.   Physical Exam Pulmonary:      Effort: Pulmonary effort is normal.  Neurological:     Mental Status: She is alert.     Vital Signs: BP 116/60 (BP Location: Right Arm)   Pulse 75   Temp 98.1 F (36.7 C)   Resp 16   Ht 4\' 11"  (1.499 m)   Wt 41.3 kg   SpO2 100%   BMI 18.38 kg/m  Pain Scale: 0-10   Pain Score: Asleep   SpO2: SpO2: 100 % O2 Device:SpO2: 100 % O2 Flow Rate: .O2 Flow Rate (L/min): 2 L/min  IO: Intake/output summary:   Intake/Output Summary (Last 24 hours) at 04/26/2020 1546 Last data filed at 04/26/2020 1459 Gross per 24 hour  Intake --  Output 1000 ml  Net -1000 ml    LBM: Last BM Date: 04/26/20 Baseline Weight: Weight: 41.3 kg Most recent weight: Weight: 41.3 kg     Palliative Assessment/Data:     Time In: 3:40 Time Out: 4:10 Time Total: 30 min Greater than 50%  of this time was spent counseling and coordinating care related to the above assessment and plan.  Signed by: 04/28/20, NP   Please contact Palliative Medicine Team phone at [phone] for questions and concerns.  For individual provider: See [phone]

## 2020-04-26 NOTE — Evaluation (Addendum)
Clinical/Bedside Swallow Evaluation Patient Details  Name: Christina Mooney MRN: [id number] Date of Birth: 1926-03-31  Today's Date: 04/26/2020 Time: SLP Start Time (ACUTE ONLY): 0935 SLP Stop Time (ACUTE ONLY): 1035 SLP Time Calculation (min) (ACUTE ONLY): 60 min  Past Medical History:  Past Medical History:  Diagnosis Date  . Chronic back pain   . Diverticulosis   . GERD (gastroesophageal reflux disease)   . Hyperlipidemia   . Hypertension   . Irritable bowel syndrome   . Lumbago   . Peripheral arterial disease (HCC)   . Spinal stenosis of lumbar region with neurogenic claudication    Past Surgical History:  Past Surgical History:  Procedure Laterality Date  . APPENDECTOMY    . BREAST BIOPSY Right 1980   neg  . INTRAMEDULLARY (IM) NAIL INTERTROCHANTERIC Left 04/23/2020   Procedure: INTRAMEDULLARY (IM) NAIL INTERTROCHANTRIC;  Surgeon: Deeann Saint, MD;  Location: [facility] ORS;  Service: Orthopedics;  Laterality: Left;  . ORIF FEMUR FRACTURE Left 07/19/2019   Procedure: OPEN REDUCTION INTERNAL FIXATION (ORIF) DISTAL FEMUR FRACTURE;  Surgeon: Lyndle Herrlich, MD;  Location: [facility] ORS;  Service: Orthopedics;  Laterality: Left;   HPI:  Pt is a 86 y.o. female presenting to hospital 12/12 s/p fall when in bathroom.  Pt sustained large laceration L parietal scalp.  Imaging showing displaced intertrochanteric fx L femur, acute appearing fx's of L pubic symphysis, and chronic fx deformities of B pubic rami.  Pt s/p L IMN intertrochantric 04/23/20.  PMH includes s/p ORIF distal L femur fx 07/19/19, carotid stenosis, htn, Dementia, chronic back pain, IBS, PAD, and spinal stenosis of lumbar region with neurogenic claudication.   Assessment / Plan / Recommendation Clinical Impression  Pt appears to present w/ adequate oropharyngeal phase swallow w/ No oropharyngeal phase dysphagia noted, No neuromuscular deficits noted. Pt consumed po trials w/ No overt, clinical s/s of aspiration during po  trials. Pt appears at reduced risk for aspiration following general aspiration precautions. Pt explained need for general aspiration precautions and agreed verbally to the need for following them especially sitting upright for all oral intake. She c/o mild "soreness" in "back of my throat" -- pt is on [facility] O2 support currently which may be increasing dryness, esp. when swallowing. Discussed and encouraged more sips of water based drinks, jello, applesauce, popcicles.  Pt assisted w/ positioning d/t general weakness then encouraged to feed self some items of her meal. She consumed trials of thin liquids VIA CUP (only), then trials of purees and mech soft foods. No overt clinical s/s of aspiration were noted w/ any trial/consistency; respiratory status remained calm and unlabored, vocal quality clear b/t trials. Pt held Cup when drinking following instructions for single, small sips slowly. NO straws were utiilized for better oral control. NSG reported good toleration of Pills in Puree; the puree providing cohesion for swallowing tablets. Oral phase appeared grossly [facility] for bolus management and timely A-P transfer for swallowing; oral clearing achieved w/ consistencies given. However, Increased Time needed for full mastication of tougher solid foods such as meat.  Recommend a more mech soft diet w/ Chopped meats w/ gravies added to moisten; Thin liquids VIA CUP ONLY -- No Straws. Support w/ tray setup at meals d/t pt's generalized weakness - pt MUST HOLD CUP TO DRINK for safer swallowing. Recommend general aspiration precautions; Pills Whole vs Crushed in Puree as needed moving forward to reduce risk for aspiration; post d/c; tray setup and positioning assistance for meals. Education given on general aspiration precautions; food  consistencies for conservation of energy, ease of nutritional intake at this time. Recommend Dietician f/u for support; Palliative Care consult for GOC. ST services will be available for  further education as needed while admitted; no further skilled needs. NSG updated, agreed. Precautions posted at bedside. SLP Visit Diagnosis: Dysphagia, unspecified (R13.10) (min slower oral phase w/ tough textured foods)    Aspiration Risk  Risk for inadequate nutrition/hydration (reduced risk for aspiration following precautions)    Diet Recommendation     Medication Administration: Whole meds with puree (vs need to Crush)    Other  Recommendations Recommended Consults:  (Dietician and Palliative Care f/u for GOC) Oral Care Recommendations: Oral care BID;Oral care before and after PO;Staff/trained caregiver to provide oral care Other Recommendations:  (n/a)   Follow up Recommendations None      Frequency and Duration  (n/a)   (n/a)       Prognosis Prognosis for Safe Diet Advancement: Good Barriers to Reach Goals: Cognitive deficits;Severity of deficits;Time post onset (min)      Swallow Study   General Date of Onset: 04/21/20 HPI: Pt is a 86 y.o. female presenting to hospital 12/12 s/p fall when in bathroom.  Pt sustained large laceration L parietal scalp.  Imaging showing displaced intertrochanteric fx L femur, acute appearing fx's of L pubic symphysis, and chronic fx deformities of B pubic rami.  Pt s/p L IMN intertrochantric 04/23/20.  PMH includes s/p ORIF distal L femur fx 07/19/19, carotid stenosis, htn, Dementia, chronic back pain, IBS, PAD, and spinal stenosis of lumbar region with neurogenic claudication. Type of Study: Bedside Swallow Evaluation Previous Swallow Assessment: none noted Diet Prior to this Study: Regular;Thin liquids Temperature Spikes Noted: No (wbc 10.5) Respiratory Status: Nasal cannula (1L) History of Recent Intubation: No Behavior/Cognition: Alert;Cooperative;Pleasant mood;Confused;Distractible;Requires cueing (baseline Dementia) Oral Cavity Assessment: Dry (min) Oral Care Completed by SLP: Recent completion by staff Oral Cavity - Dentition:  Adequate natural dentition Vision: Functional for self-feeding Self-Feeding Abilities: Able to feed self;Needs assist;Needs set up Patient Positioning: Upright in bed (needed positioning) Baseline Vocal Quality: Normal;Low vocal intensity (min) Volitional Cough:  (adequate) Volitional Swallow: Able to elicit    Oral/Motor/Sensory Function Overall Oral Motor/Sensory Function: Within functional limits   Ice Chips Ice chips: Within functional limits Presentation: Spoon (fed; 2 trials)   Thin Liquid Thin Liquid: Within functional limits Presentation: Cup;Self Fed (15+ trials) Other Comments: several ozs slowly    Nectar Thick Nectar Thick Liquid: Not tested   Honey Thick Honey Thick Liquid: Not tested   Puree Puree: Within functional limits Presentation: Self Fed;Spoon (~4=ozs)   Solid     Solid: Impaired Presentation: Self Fed;Spoon (15+ trials of mech soft consistency; bacon) Oral Phase Impairments:  (increased time w/ bacon) Pharyngeal Phase Impairments:  (none)       Christina Som, MS, [facility] [phone] Christina Mooney 04/26/2020,11:39 AM

## 2020-04-27 MED ORDER — CEPHALEXIN 500 MG PO CAPS: 500.0000 mg | ORAL_CAPSULE | Freq: Two times a day (BID) | ORAL | 0 refills | Status: DC

## 2020-04-27 MED ORDER — BISACODYL 5 MG PO TBEC: 5.0000 mg | DELAYED_RELEASE_TABLET | Freq: Every day | ORAL | 0 refills | Status: DC | PRN

## 2020-04-27 MED ORDER — TRAMADOL HCL 50 MG PO TABS: 50.0000 mg | ORAL_TABLET | Freq: Four times a day (QID) | ORAL | 0 refills | Status: DC | PRN

## 2020-04-27 MED ORDER — CEPHALEXIN 250 MG/5ML PO SUSR
500.0000 mg | Freq: Two times a day (BID) | ORAL | Status: DC
  Administered 2020-04-28 – 2020-04-29 (×3): 500 mg via ORAL
  Filled 2020-04-27 (×5): qty 10

## 2020-04-27 MED ORDER — FERROUS SULFATE 325 (65 FE) MG PO TABS: 325.0000 mg | ORAL_TABLET | Freq: Every day | ORAL | 3 refills | Status: DC

## 2020-04-27 MED ORDER — DOCUSATE SODIUM 100 MG PO CAPS: 100.0000 mg | ORAL_CAPSULE | Freq: Two times a day (BID) | ORAL | 0 refills | Status: DC

## 2020-04-27 MED ORDER — CEPHALEXIN 250 MG/5ML PO SUSR: 500.0000 mg | Freq: Two times a day (BID) | ORAL | 0 refills | Status: DC

## 2020-04-27 MED ORDER — TRAMADOL HCL 50 MG PO TABS: 50.0000 mg | ORAL_TABLET | Freq: Four times a day (QID) | ORAL | Status: DC | PRN

## 2020-04-27 NOTE — Progress Notes (Signed)
Subjective: 4 Days Post-Op Procedure(s) (LRB): INTRAMEDULLARY (IM) NAIL INTERTROCHANTRIC (Left) Patient is alert and sitting up.  Her son is at the bedside.  Plan is for her to go back to [facility] today to continue her rehabilitation.  Hemoglobin remained stable at 10.0 yesterday.  Dressing is dry.  Pain is minimal.  Patient reports pain as mild.  Objective:   VITALS:   Vitals:   04/27/20 0912 04/27/20 1302  BP: (!) 149/84 (!) 156/75  Pulse: 80 86  Resp: 18   Temp: 98.1 F (36.7 C) 98 F (36.7 C)  SpO2: 98% 97%    Neurovascular intact Dorsiflexion/Plantar flexion intact Incision: dressing C/D/I  LABS Recent Labs    04/24/20 1648 04/25/20 0426 04/26/20 0428  HGB 9.1* 9.6* 10.0*  HCT 26.7* 27.5* 29.7*  WBC  --  9.8 10.5  PLT  --  284 321    Recent Labs    04/25/20 0426  NA 142  K 3.1*  BUN 15  CREATININE 0.42*  GLUCOSE 99    No results for input(s): LABPT, INR in the last 72 hours.   Assessment/Plan: 4 Days Post-Op Procedure(s) (LRB): INTRAMEDULLARY (IM) NAIL INTERTROCHANTRIC (Left)   Up with therapy Discharge to SNF later today.  Resume aspirin and Plavix  Partial weightbearing left leg with walker Return to clinic 2 weeks for x-ray and staple removal

## 2020-04-27 NOTE — Plan of Care (Signed)

## 2020-04-27 NOTE — Discharge Instructions (Signed)
Distal Femur Fracture, Adult A distal femur fracture is a break in the lower part of the thigh bone (femur) near the knee joint. There are several types of this fracture. They include:  Stable. The bones remain in place after the break.  Displaced. The bones no longer line up after the break.  Comminuted. The bone breaks into several pieces.  Open. The broken bone comes through the skin. What are the causes? This condition may be caused by:  A fall. This injury can result from the impact of the fall or other violent contact.  A motor vehicle accident.  A sports injury.  Other collisions with a hard surface. What increases the risk? You are more likely to develop this condition if:  You are female.  You are 20-50 years old.  You participate in high-energy sports such as soccer, ice hockey, football, and baseball.  You have a condition that weakens the bones, such as osteoporosis.  You have had a knee replacement. What are the signs or symptoms? Symptoms of this condition include:  Pain.  Swelling.  Bruising.  Inability to bend your knee.  Misshapen knee.  Inability to walk.  Inability to use your injured leg to support your body weight. How is this diagnosed? This condition may be diagnosed based on:  Your symptoms.  A physical exam.  Other tests, such as: ? Imaging studies, such as an X-ray, CT scan, MRI scan, or ultrasound. ? A procedure to view the inside of your knee with a small camera (arthroscopy). How is this treated? This condition may be treated with:  A splint. You will wear the splint until the swelling goes down.  A cast. This is used to keep the fractured bone from moving while it heals. A cast is usually put on after swelling has gone down.  Surgery. This is done to move the bones back into place. The surgeon will use a combination of screws, metal plates, or rods (hardware) to hold the bones in place.  Physical therapy. Follow these  instructions at home: Medicines  Take over-the-counter and prescription medicines only as told by your health care provider.  Do not drive or operate heavy machinery while taking pain medicine.  If you are taking prescription pain medicine, take actions to prevent or treat constipation. Your health care provider may recommend that you: ? Drink enough fluid to keep your urine pale yellow. ? Eat foods that are high in fiber, such as fresh fruits and vegetables, whole grains, and beans. ? Limit foods that are high in fat and processed sugars, such as fried or sweet foods. ? Take an over-the-counter or prescription medicine for constipation. If you have a splint:  Wear the splint as told by your health care provider. Remove it only as told by your health care provider.  Loosen the splint if your toes tingle, become numb, or turn cold and blue.  Keep the splint clean.  If the splint is not waterproof: ? Do not let it get wet. ? Cover it with a watertight covering when you take a bath or a shower. If you have a cast:  Do not stick anything inside the cast to scratch your skin. Doing that increases your risk of infection.  Check the skin around the cast every day. Tell your health care provider about any concerns.  You may put lotion on dry skin around the edges of the cast. Do not put lotion on the skin underneath the cast.  Keep the   cast clean.  If the cast is not waterproof: ? Do not let it get wet. ? Cover it with a watertight covering when you take a bath or a shower. Activity  Do not use your leg to support your body weight until your health care provider says that you can. Follow weight-bearing restrictions.  Use crutches, a cane, or a walker as directed.  Return to your normal activities as directed by your health care provider. Ask your health care provider what activities are safe for you.  Do not drive until your health care provider approves. Managing pain,  stiffness, and swelling   If directed, put ice on the injured area: ? If you have a removable splint, remove it as told by your health care provider. ? Put ice in a plastic bag. ? Place a towel between your skin and the bag. ? Leave the ice on for 20 minutes, 2-3 times a day.  Move your toes often to avoid stiffness and to lessen swelling.  Raise the injured area above the level of your heart while you are lying down. General instructions   Do not put pressure on any part of the cast or splint until it is fully hardened. This may take several hours.  Do not use any products that contain nicotine or tobacco, such as cigarettes and e-cigarettes. These can delay bone healing. If you need help quitting, ask your health care provider.  Keep all follow-up visits as told by your health care provider. This is important. Contact a health care provider if:  You have knee pain and swelling.  You have trouble walking.  Your cast becomes wet or damaged or suddenly feels too tight. Get help right away if:  Your pain and swelling get worse.  You have severe pain below the fracture.  Your skin or toenails turn blue or gray, feel cold, or become numb.  You have fluid, blood, or pus coming from under your cast.  You develop a fever.  You have pain, swelling, or redness in your leg. Summary  A distal femur fracture is a break in the lower part of the thigh bone (femur).  Falls are the most common causes of femur fractures, but sports-related injuries and motor vehicle accidents also can cause this.  Treatment will depend on characteristics of the fracture, such as location, shape, displacement, and whether the fracture broke through the skin. Options include splinting, casting, or surgery. This information is not intended to replace advice given to you by your health care provider. Make sure you discuss any questions you have with your health care provider. Document Revised: 08/17/2018  Document Reviewed: 06/16/2017 [employer] Patient Education  2020 [employer]  

## 2020-04-27 NOTE — Discharge Summary (Addendum)
Physician Discharge Summary  Johnnette BarriosFrances S Langhorst ZOX:[id number] DOB: 11/22/1925 DOA: 04/21/2020  PCP: Karie SchwalbeLetvak, Richard I, MD  Admit date: 04/21/2020 Discharge date: 04/29/2020  Admitted From:SNF Disposition:SNF  Recommendations for Outpatient Follow-up:  1. Follow up with PCP in 1-2 weeks 2. Please obtain BMP/CBC in one week 3. Follow-up with orthopedics for postop hip evaluation in 2 weeks  Home Health:NO  Equipment/Devices: AT SNF  Discharge Condition: Stable Code Status:   Code Status: Full Code Diet recommendation:  Diet Order            DIET DYS 3 Room service appropriate? Yes with Assist; Fluid consistency: Thin  Diet effective now                 Brief/Interim Summary: 86 year old female with history of GERD, dementia, carotid artery stenosis on Plavix, hyperlipidemia, hypertension, fibromuscular dysplasia presented to the ED after a fall at the skilled nursing facility after she tripped and fell on her left side. In the ED vitals are stable,Labs include:Sodium 135,potassium 3.4,chloride 100,bicarb 21,glucose 158,BUN 20,creatinine 0.67,calcium 9.7,and anion gap 14,alkaline phosphatase 59,AST 32,ALT 30,total bilirubin 1.0,WBC 11.1,hemoglobin 12.2,hematocrit 36.7,platelet 349,influenza negative,Covid negative. CT head:Marland Kitchen. Laceration is seen involving the left parietal scalp near the vertex with overlying hematoma. 2. Mild diffuse cortical atrophy. Mild chronic ischemic white matter disease. No acute intracranial abnormality seen. CT hip left:There is a displaced intertrochanteric fracture of the left femur. There are additional fractures of the left pubic symphysis, which appear acute, and additional chronic fracture deformities of the bilateral pubic rami, which were present on prior radiographs. Partially imaged plate and screw fixation of the distal left femur. Osteopenia. Seen by orthopedic and underwent operative intervention on 04/23/20. Postop acute blood  loss anemia noted 12/15 needing 1 unit PRBC and hemoglobin has improved to 10 g since then.  She remains weak frail, seen by pt ot and Palliative care Plan is to cont w/ SNF for now. Advised palliative care discussion at SNF Pt was awaiting on SNF and Authorization has been obtained and [facility] Patient remains hemodynamically stable alert awake tolerating p.o. but appears weak deconditioned and will need continued PT OT encouragement supportive care. Plan of care discharge patient's son at the bedside.  Discharge Diagnoses:   Closed left subtrochanteric femur fracture status post operative repair 12/14 by orthopedics. Cont snf ptot, cont w/ asa/plavis for dvt prophylaxis as per ortho we will add low-dose tramadol for pain control if not controlled by Tylenol.  Actually she does take tramadol normal at SNF  Anemia multifactorial from acute blood loss in the setting of Plavix/hip fracture and also with iron deficiency: As low as 7.1 g patient was transfused 1 unit PRBC -improved to 10 g.  Adult failure to thrive with severe protein calorie malnutrition with mild dementia/cognitive impairment and decline over past years/physical deconditioning, poor po intake: Son assisting closely.  Does appear deconditioned and will benefit with continued supportive measures, and family environment back to a skilled nursing rehab.  She remains hemodynamically stable and afebrile.  Encourage po intake pt/ot diet adjusted by SLP.  Of note overall prognosis does not appear bright given her old age,hip fracture multiple comorbidities  Acute cystitis/UTI with E. Coli- s/p Ancef AND completing with PO keflex at SNF.  Scalp laceration from fall stapled in the ER. Cont wound care.  Constipation cont tool softener MiraLAX Colace.    Essential hypertension: BP stable on Toprol   Neuropathy cont gabapentin  GERD: PPI to cont  Carotid artery stenosis/Aortic  atherosclerosis/Hyperlipidemia- on plavix and resumed post op  Hypokalemia repleted.  Goals of care: briefly discussed w/ son 12/16-remains full code.  Given overall frailty old age and co-morbidities, requested Palliative care eval here and cont ongoinG GOC discussion at SNF.  I did have a frank discussion with patient's son today and she will benefit with palliative care evaluation and further ongoing discussion about goals of care and CODE STATUS as overall outlook does not appear bright in near future.  Consults:  Ortho, palliative care  Subjective: Patient alert awake and weak.  Able to tell me her name. May have baseline memory issues getting unmasked in this acute illness  Discharge Exam: Vitals:   04/29/20 0438 04/29/20 0721  BP: (!) 146/64 (!) 118/59  Pulse: 80 83  Resp: 16 16  Temp: (!) 97.5 F (36.4 C) 98.3 F (36.8 C)  SpO2: 100% 100%   General: Pt is alert, awake, not in acute distress Cardiovascular: RRR, S1/S2 +, no rubs, no gallops Respiratory: CTA bilaterally, no wheezing, no rhonchi Abdominal: Soft, NT, ND, bowel sounds + Extremities: no edema, no cyanosis  Discharge Instructions  Discharge Instructions    Discharge instructions   Complete by: As directed    Follow w/ orthopedics in 2 wk post op for wound check staple removal and xray hip  Please call call MD or return to ER for similar or worsening recurring problem that brought you to hospital or if any fever,nausea/vomiting,abdominal pain, uncontrolled pain, chest pain,  shortness of breath or any other alarming symptoms.  DIET for discharge: Mech Soft w/ Minced meats, gravy; Thin liquids VIA CUP - NO Straws. General aspiration precautions and tray setup at meals. Pills in Puree for safer swallowing. Recommend a Dietician consult at SNF/Rehab for support -- more liquids in diet as able, foods easy to eat(ie, soups).  Please follow-up your doctor as instructed in a week time and call the office for  appointment.  Please avoid alcohol, smoking, or any other illicit substance and maintain healthy habits including taking your regular medications as prescribed.  You were cared for by a hospitalist during your hospital stay. If you have any questions about your discharge medications or the care you received while you were in the hospital after you are discharged, you can call the unit and ask to speak with the hospitalist on call if the hospitalist that took care of you is not available.  Once you are discharged, your primary care physician will handle any further medical issues. Please note that NO REFILLS for any discharge medications will be authorized once you are discharged, as it is imperative that you return to your primary care physician (or establish a relationship with a primary care physician if you do not have one) for your aftercare needs so that they can reassess your need for medications and monitor your lab values   Discharge wound care:   Complete by: As directed    Reinforce dressing  Until discontinued    . Follow up with orthopedics   Increase activity slowly   Complete by: As directed      Allergies as of 04/29/2020      Reactions   Codeine Nausea And Vomiting   Garlic Other (See Comments)   indigestion   Milk-related Compounds Diarrhea, Other (See Comments)   Diarrhea/cramping   Sorbitol Diarrhea, Other (See Comments)   Diarrhea/cramping      Medication List    TAKE these medications   acetaminophen 500  MG tablet Commonly known as: TYLENOL Take 1,000 mg by mouth every 12 (twelve) hours as needed for mild pain (discomfort).   acetaminophen 325 MG tablet Commonly known as: TYLENOL Take 650 mg by mouth 3 (three) times daily.   alum & mag hydroxide-simeth 400-400-40 MG/5ML suspension Commonly known as: MAALOX PLUS Take 30 mLs by mouth every 4 (four) hours as needed for indigestion (gas/heartburn).   ARTIFICIAL TEAR SOLUTION OP Apply 1 drop to eye every 8  (eight) hours as needed (dry eyes).   aspirin EC 81 MG tablet Take 81 mg by mouth daily. Swallow whole.   bisacodyl 10 MG suppository Commonly known as: DULCOLAX Place 10 mg rectally daily as needed for moderate constipation. What changed: Another medication with the same name was added. Make sure you understand how and when to take each.   bisacodyl 5 MG EC tablet Commonly known as: DULCOLAX Take 1 tablet (5 mg total) by mouth daily as needed for moderate constipation. What changed: You were already taking a medication with the same name, and this prescription was added. Make sure you understand how and when to take each.   bismuth subsalicylate 262 MG/15ML suspension Commonly known as: PEPTO BISMOL Take 30 mLs by mouth every 4 (four) hours as needed for diarrhea or loose stools.   cephALEXin 250 MG/5ML suspension Commonly known as: KEFLEX Take 10 mLs (500 mg total) by mouth every 12 (twelve) hours for 1 dose.   cholecalciferol 10 MCG (400 UNIT) Tabs tablet Commonly known as: VITAMIN D3 Take 400 Units by mouth daily.   clopidogrel 75 MG tablet Commonly known as: PLAVIX TAKE 1 TABLET BY MOUTH DAILY   docusate sodium 100 MG capsule Commonly known as: COLACE Take 1 capsule (100 mg total) by mouth 2 (two) times daily.   esomeprazole 20 MG capsule Commonly known as: NEXIUM Take 20 mg by mouth daily.   ferrous sulfate 325 (65 FE) MG tablet Take 1 tablet (325 mg total) by mouth daily with breakfast.   gabapentin 100 MG capsule Commonly known as: NEURONTIN Take 100 mg by mouth at bedtime.   metoprolol succinate 25 MG 24 hr tablet Commonly known as: TOPROL-XL Take 12.5 mg by mouth daily.   multivitamin with minerals Tabs tablet Take 1 tablet by mouth daily. What changed: when to take this   polyethylene glycol 17 g packet Commonly known as: MIRALAX / GLYCOLAX Take 17 g by mouth every other day.   polyethylene glycol 17 g packet Commonly known as: MIRALAX /  GLYCOLAX Take 17 g by mouth daily as needed for mild constipation.   sodium chloride 0.65 % Soln nasal spray Commonly known as: OCEAN Place 1 spray into both nostrils every 2 (two) hours as needed (dry nasal passages).   Systane 0.4-0.3 % Gel ophthalmic gel Generic drug: Polyethyl Glycol-Propyl Glycol Place 1 application into both eyes daily as needed (dry eyes).   Systane Complete 0.6 % Soln Generic drug: Propylene Glycol Apply 1 drop to eye every 8 (eight) hours as needed (dry eyes).   traMADol 50 MG tablet Commonly known as: ULTRAM Take 1 tablet (50 mg total) by mouth every 6 (six) hours as needed for up to 6 doses for severe pain (pain/discomfort). What changed: reasons to take this            Discharge Care Instructions  (From admission, onward)         Start     Ordered   04/27/20 0000  Discharge wound care:  Comments: Reinforce dressing  Until discontinued    . Follow up with orthopedics   04/27/20 1751          Contact information for follow-up providers    Deeann Saint, MD. Go on 05/15/2020.   Specialty: Orthopedic Surgery Why: Appointment time 9:45a.m Contact information: [address] [place] [address] [phone]        Tillman Abide I, MD Follow up in 1 week(s).   Specialties: Internal Medicine, Pediatrics Contact information: [address] [place] [address] [phone]            Contact information for after-discharge care    Destination    [facility] .   Service: Skilled Nursing Contact information: [address] [place] [address] [phone]                 Allergies  Allergen Reactions  . Codeine Nausea And Vomiting  . Garlic Other (See Comments)    indigestion  . Milk-Related Compounds Diarrhea and Other (See Comments)    Diarrhea/cramping  . Sorbitol Diarrhea and Other (See Comments)    Diarrhea/cramping    The results of significant  diagnostics from this hospitalization (including imaging, microbiology, ancillary and laboratory) are listed below for reference.    Microbiology: Recent Results (from the past 240 hour(s))  Resp Panel by RT-PCR (Flu A&B, Covid) Nasopharyngeal Swab     Status: None   Collection Time: 04/21/20  2:09 PM   Specimen: Nasopharyngeal Swab; Nasopharyngeal(NP) swabs in vial transport medium  Result Value Ref Range Status   SARS Coronavirus 2 by RT PCR NEGATIVE NEGATIVE Final    Comment: (NOTE) SARS-CoV-2 target nucleic acids are NOT DETECTED.  The SARS-CoV-2 RNA is generally detectable in upper respiratory specimens during the acute phase of infection. The lowest concentration of SARS-CoV-2 viral copies this assay can detect is 138 copies/mL. A negative result does not preclude SARS-Cov-2 infection and should not be used as the sole basis for treatment or other patient management decisions. A negative result may occur with  improper specimen collection/handling, submission of specimen other than nasopharyngeal swab, presence of viral mutation(s) within the areas targeted by this assay, and inadequate number of viral copies(<138 copies/mL). A negative result must be combined with clinical observations, patient history, and epidemiological information. The expected result is Negative.  Fact Sheet for Patients:  BloggerCourse.com  Fact Sheet for Healthcare Providers:  SeriousBroker.it  This test is no t yet approved or cleared by the [place] FDA and  has been authorized for detection and/or diagnosis of SARS-CoV-2 by FDA under an Emergency Use Authorization (EUA). This EUA will remain  in effect (meaning this test can be used) for the duration of the COVID-19 declaration under Section 564(b)(1) of the Act, 21 U.S.C.section 360bbb-3(b)(1), unless the authorization is terminated  or revoked sooner.       Influenza A by PCR NEGATIVE  NEGATIVE Final   Influenza B by PCR NEGATIVE NEGATIVE Final    Comment: (NOTE) The Xpert Xpress SARS-CoV-2/FLU/RSV plus assay is intended as an aid in the diagnosis of influenza from Nasopharyngeal swab specimens and should not be used as a sole basis for treatment. Nasal washings and aspirates are unacceptable for Xpert Xpress SARS-CoV-2/FLU/RSV testing.  Fact Sheet for Patients: BloggerCourse.com  Fact Sheet for Healthcare Providers: SeriousBroker.it  This test is not yet approved or cleared by the [place] FDA and has been authorized for detection and/or diagnosis of SARS-CoV-2 by  FDA under an Emergency Use Authorization (EUA). This EUA will remain in effect (meaning this test can be used) for the duration of the COVID-19 declaration under Section 564(b)(1) of the Act, 21 U.S.C. section 360bbb-3(b)(1), unless the authorization is terminated or revoked.  Performed at [facility], [address], [place], [place] [address]   Urine Culture     Status: Abnormal   Collection Time: 04/21/20  8:16 PM   Specimen: Urine, Catheterized  Result Value Ref Range Status   Specimen Description   Final    URINE, CATHETERIZED Performed at [facility], [address] [place], [place] [address]    Special Requests   Final    NONE Performed at [facility], [address] [place], [place] [address]    Culture >=100,000 COLONIES/mL ESCHERICHIA COLI (A)  Final   Report Status 04/24/2020 FINAL  Final   Organism ID, Bacteria ESCHERICHIA COLI (A)  Final      Susceptibility   Escherichia coli - MIC*    AMPICILLIN >=32 RESISTANT Resistant     CEFAZOLIN <=4 SENSITIVE Sensitive     CEFEPIME <=0.12 SENSITIVE Sensitive     CEFTRIAXONE <=0.25 SENSITIVE Sensitive     CIPROFLOXACIN <=0.25 SENSITIVE Sensitive     GENTAMICIN <=1 SENSITIVE Sensitive     IMIPENEM <=0.25 SENSITIVE Sensitive     NITROFURANTOIN <=16  SENSITIVE Sensitive     TRIMETH/SULFA <=20 SENSITIVE Sensitive     AMPICILLIN/SULBACTAM >=32 RESISTANT Resistant     PIP/TAZO <=4 SENSITIVE Sensitive     * >=100,000 COLONIES/mL ESCHERICHIA COLI  MRSA PCR Screening     Status: None   Collection Time: 04/22/20  9:03 AM   Specimen: Nasal Mucosa; Nasopharyngeal  Result Value Ref Range Status   MRSA by PCR NEGATIVE NEGATIVE Final    Comment:        The GeneXpert MRSA Assay (FDA approved for NASAL specimens only), is one component of a comprehensive MRSA colonization surveillance program. It is not intended to diagnose MRSA infection nor to guide or monitor treatment for MRSA infections. Performed at [facility], [address] [place], [place] [address]     Procedures/Studies: DG Chest 1 View  Result Date: 04/21/2020 CLINICAL DATA:  Dizziness, fall today. EXAM: CHEST  1 VIEW COMPARISON:  July 18, 2019. FINDINGS: Stable cardiomediastinal silhouette. No pneumothorax or pleural effusion is noted. Both lungs are clear. The visualized skeletal structures are unremarkable. IMPRESSION: No active disease. Electronically Signed   By: Lupita Raider M.D.   On: 04/21/2020 14:52   CT Head Wo Contrast  Result Date: 04/21/2020 CLINICAL DATA:  Head laceration after fall today. EXAM: CT HEAD WITHOUT CONTRAST TECHNIQUE: Contiguous axial images were obtained from the base of the skull through the vertex without intravenous contrast. COMPARISON:  July 18, 2019. FINDINGS: Brain: Mild diffuse cortical atrophy is noted. Mild chronic ischemic white matter disease is noted. No mass effect or midline shift is noted. Ventricular size is within normal limits. There is no evidence of mass lesion, hemorrhage or acute infarction. Vascular: No hyperdense vessel or unexpected calcification. Skull: Normal. Negative for fracture or focal lesion. Sinuses/Orbits: No acute finding. Other: Laceration is seen involving the left parietal scalp near the vertex with  overlying hematoma. IMPRESSION: 1. Laceration is seen involving the left parietal scalp near the vertex with overlying hematoma. 2. Mild diffuse cortical atrophy. Mild chronic ischemic white matter disease. No acute intracranial abnormality seen. Electronically Signed   By: Roque Lias  Jr M.D.   On: 04/21/2020 11:53   CT CERVICAL SPINE WO CONTRAST  Result Date: 04/21/2020 CLINICAL DATA:  Fall out of wheelchair on the way to the bathroom. EXAM: CT CERVICAL SPINE WITHOUT CONTRAST TECHNIQUE: Multidetector CT imaging of the cervical spine was performed without intravenous contrast. Multiplanar CT image reconstructions were also generated. COMPARISON:  CT 02/27/2019 FINDINGS: Alignment: Stable grade 1 anterolisthesis of C4 on C5. No traumatic subluxation. Skull base and vertebrae: No acute fracture. The dens and skull base are intact. Degenerative flattening of the anterior arch of C1. Soft tissues and spinal canal: No prevertebral fluid or swelling. No visible canal hematoma. Disc levels: Disc space narrowing and endplate spurring W2-N5, C5-C6, and C6-C7. Multilevel facet hypertrophy. Degenerative changes are grossly stable from prior exam. Upper chest: No acute or unexpected findings. Other: None. IMPRESSION: Multilevel degenerative change in the cervical spine without acute fracture or subluxation. Electronically Signed   By: Narda Rutherford M.D.   On: 04/21/2020 18:29   DG HIP OPERATIVE UNILAT W OR W/O PELVIS LEFT  Result Date: 04/23/2020 CLINICAL DATA:  Left IM nail for femur fracture. EXAM: OPERATIVE left HIP (WITH PELVIS IF PERFORMED) TECHNIQUE: Fluoroscopic spot image(s) were submitted for interpretation post-operatively. COMPARISON:  Preoperative radiograph 04/21/2020 FINDINGS: Three fluoroscopic spot views obtained in the operating room of the left hip in frontal and lateral projections. Intramedullary nail with trans trochanteric and distal locking screws fixating intertrochanteric femur fracture.  Fracture is in improved alignment fluoroscopy time 46 seconds. IMPRESSION: Intraoperative fluoroscopy after ORIF of left intertrochanteric femur fracture. Electronically Signed   By: Narda Rutherford M.D.   On: 04/23/2020 15:21   DG Hip Unilat W or Wo Pelvis 2-3 Views Left  Result Date: 04/21/2020 CLINICAL DATA:  Fall, left hip pain EXAM: DG HIP (WITH OR WITHOUT PELVIS) 2-3V LEFT COMPARISON:  07/18/2019 FINDINGS: Osteopenia. There is a displaced intertrochanteric fracture of the left femur. Partially imaged plate and screw fixation of the distal left femur. There are additional fractures of the left pubic symphysis, which appear acute, and additional chronic fracture deformities of the bilateral pubic rami, which were present on prior radiographs. The remaining pelvis and proximal right femur are unremarkable as included. IMPRESSION: 1. There is a displaced intertrochanteric fracture of the left femur. 2. There are additional fractures of the left pubic symphysis, which appear acute, and additional chronic fracture deformities of the bilateral pubic rami, which were present on prior radiographs. 3. Partially imaged plate and screw fixation of the distal left femur. 4. Osteopenia. Electronically Signed   By: Lauralyn Primes M.D.   On: 04/21/2020 14:51    Labs: BNP (last 3 results) No results for input(s): BNP in the last 8760 hours. Basic Metabolic Panel: Recent Labs  Lab 04/23/20 0348 04/24/20 0534 04/25/20 0426  NA 143 142 142  K 3.2* 3.5 3.1*  CL 110 108 107  CO2 GLUCOSE 84 118* 99  BUN CREATININE 0.49 0.54 0.42*  CALCIUM 8.7* 8.4* 8.4*  MG 2.2  --   --   PHOS 2.2*  --   --    Liver Function Tests: No results for input(s): AST, ALT, ALKPHOS, BILITOT, PROT, ALBUMIN in the last 168 hours. No results for input(s): LIPASE, AMYLASE in the last 168 hours. No results for input(s): AMMONIA in the last 168 hours. CBC: Recent Labs  Lab 04/23/20 0348 04/24/20 0534  04/24/20 1648 04/25/20 0426 04/26/20 0428  WBC 11.2* 10.4  --  9.8 10.5  HGB 7.9* 7.1* 9.1* 9.6* 10.0*  HCT 24.3* 21.8* 26.7* 27.5* 29.7*  MCV 86.2 87.2  --  85.7 87.1  PLT 254 259  --  284 321   Cardiac Enzymes: No results for input(s): CKTOTAL, CKMB, CKMBINDEX, TROPONINI in the last 168 hours. BNP: Invalid input(s): POCBNP CBG: No results for input(s): GLUCAP in the last 168 hours. D-Dimer No results for input(s): DDIMER in the last 72 hours. Hgb A1c No results for input(s): HGBA1C in the last 72 hours. Lipid Profile No results for input(s): CHOL, HDL, LDLCALC, TRIG, CHOLHDL, LDLDIRECT in the last 72 hours. Thyroid function studies No results for input(s): TSH, T4TOTAL, T3FREE, THYROIDAB in the last 72 hours.  Invalid input(s): FREET3 Anemia work up No results for input(s): VITAMINB12, FOLATE, FERRITIN, TIBC, IRON, RETICCTPCT in the last 72 hours. Urinalysis    Component Value Date/Time   COLORURINE AMBER (A) 04/21/2020 2016   APPEARANCEUR CLOUDY (A) 04/21/2020 2016   LABSPEC 1.040 (H) 04/21/2020 2016   PHURINE 5.0 04/21/2020 2016   GLUCOSEU NEGATIVE 04/21/2020 2016   HGBUR LARGE (A) 04/21/2020 2016   BILIRUBINUR NEGATIVE 04/21/2020 2016   KETONESUR 80 (A) 04/21/2020 2016   PROTEINUR >=300 (A) 04/21/2020 2016   NITRITE POSITIVE (A) 04/21/2020 2016   LEUKOCYTESUR MODERATE (A) 04/21/2020 2016   Sepsis Labs Invalid input(s): PROCALCITONIN,  WBC,  LACTICIDVEN Microbiology Recent Results (from the past 240 hour(s))  Resp Panel by RT-PCR (Flu A&B, Covid) Nasopharyngeal Swab     Status: None   Collection Time: 04/21/20  2:09 PM   Specimen: Nasopharyngeal Swab; Nasopharyngeal(NP) swabs in vial transport medium  Result Value Ref Range Status   SARS Coronavirus 2 by RT PCR NEGATIVE NEGATIVE Final    Comment: (NOTE) SARS-CoV-2 target nucleic acids are NOT DETECTED.  The SARS-CoV-2 RNA is generally detectable in upper respiratory specimens during the acute phase of  infection. The lowest concentration of SARS-CoV-2 viral copies this assay can detect is 138 copies/mL. A negative result does not preclude SARS-Cov-2 infection and should not be used as the sole basis for treatment or other patient management decisions. A negative result may occur with  improper specimen collection/handling, submission of specimen other than nasopharyngeal swab, presence of viral mutation(s) within the areas targeted by this assay, and inadequate number of viral copies(<138 copies/mL). A negative result must be combined with clinical observations, patient history, and epidemiological information. The expected result is Negative.  Fact Sheet for Patients:  BloggerCourse.com  Fact Sheet for Healthcare Providers:  SeriousBroker.it  This test is no t yet approved or cleared by the [place] FDA and  has been authorized for detection and/or diagnosis of SARS-CoV-2 by FDA under an Emergency Use Authorization (EUA). This EUA will remain  in effect (meaning this test can be used) for the duration of the COVID-19 declaration under Section 564(b)(1) of the Act, 21 U.S.C.section 360bbb-3(b)(1), unless the authorization is terminated  or revoked sooner.       Influenza A by PCR NEGATIVE NEGATIVE Final   Influenza B by PCR NEGATIVE NEGATIVE Final    Comment: (NOTE) The Xpert Xpress SARS-CoV-2/FLU/RSV plus assay is intended as an aid in the diagnosis of influenza from Nasopharyngeal swab specimens and should not be used as a sole basis for treatment. Nasal washings and aspirates are unacceptable for Xpert Xpress SARS-CoV-2/FLU/RSV testing.  Fact Sheet for Patients: BloggerCourse.com  Fact Sheet for Healthcare Providers: SeriousBroker.it  This test is not yet approved or cleared by the [place]  FDA and has been authorized for detection and/or diagnosis of SARS-CoV-2  by FDA under an Emergency Use Authorization (EUA). This EUA will remain in effect (meaning this test can be used) for the duration of the COVID-19 declaration under Section 564(b)(1) of the Act, 21 U.S.C. section 360bbb-3(b)(1), unless the authorization is terminated or revoked.  Performed at [facility], [address], [place], [place] [address]   Urine Culture     Status: Abnormal   Collection Time: 04/21/20  8:16 PM   Specimen: Urine, Catheterized  Result Value Ref Range Status   Specimen Description   Final    URINE, CATHETERIZED Performed at [facility], [address] [place], [place] [address]    Special Requests   Final    NONE Performed at [facility], [address] [place], [place] [address]    Culture >=100,000 COLONIES/mL ESCHERICHIA COLI (A)  Final   Report Status 04/24/2020 FINAL  Final   Organism ID, Bacteria ESCHERICHIA COLI (A)  Final      Susceptibility   Escherichia coli - MIC*    AMPICILLIN >=32 RESISTANT Resistant     CEFAZOLIN <=4 SENSITIVE Sensitive     CEFEPIME <=0.12 SENSITIVE Sensitive     CEFTRIAXONE <=0.25 SENSITIVE Sensitive     CIPROFLOXACIN <=0.25 SENSITIVE Sensitive     GENTAMICIN <=1 SENSITIVE Sensitive     IMIPENEM <=0.25 SENSITIVE Sensitive     NITROFURANTOIN <=16 SENSITIVE Sensitive     TRIMETH/SULFA <=20 SENSITIVE Sensitive     AMPICILLIN/SULBACTAM >=32 RESISTANT Resistant     PIP/TAZO <=4 SENSITIVE Sensitive     * >=100,000 COLONIES/mL ESCHERICHIA COLI  MRSA PCR Screening     Status: None   Collection Time: 04/22/20  9:03 AM   Specimen: Nasal Mucosa; Nasopharyngeal  Result Value Ref Range Status   MRSA by PCR NEGATIVE NEGATIVE Final    Comment:        The GeneXpert MRSA Assay (FDA approved for NASAL specimens only), is one component of a comprehensive MRSA colonization surveillance program. It is not intended to diagnose MRSA infection nor to guide or monitor treatment for MRSA  infections. Performed at [facility], [address] [place], [place] [address]    Time coordinating discharge: 35 minutes  SIGNED: Lanae Boast, MD  [facility] 04/29/2020, 10:07 AM  If 7PM-7AM, please contact night-coverage [email or web address]

## 2020-04-28 NOTE — Progress Notes (Signed)
Seen and examined this am. She appears more alert awake today.  Hemodynamically stable, afebrile.  No new complaints.  Her son is at the bedside, Patient is awaiting for insurance authorization for discharge to skilled nursing facility. No change in the plan and she remains medically stable for discharge. Answered all the questions and son updated at the bedside.

## 2020-04-28 NOTE — Progress Notes (Signed)
Physical Therapy Treatment Patient Details Name: Christina Mooney MRN: [id number] DOB: [birth year] Today's Date: 04/28/2020    History of Present Illness Pt is a 86 y.o. female presenting to hospital 12/12 s/p fall when in bathroom.  Pt sustained large laceration L parietal scalp.  Imaging showing displaced intertrochanteric fx L femur, acute appearing fx's of L pubic symphysis, and chronic fx deformities of B pubic rami.  Pt s/p L IMN intertrochantric 04/23/20.  PMH includes s/p ORIF distal L femur fx 07/19/19, carotid stenosis, htn, dementia, chronic back pain, IBS, PAD, and spinal stenosis of lumbar region with neurogenic claudication.    PT Comments    Participated in exercises as described below.  To and from EOB with max a/dependant.  Sat x 5 minutes with min guard/assist x 1.  Stood x 2 with RW and mod a x 1 pulling on RW.  Unable to come to a complete stand leaning back on bed and my arms for support.  Unable to attempt steps.  Pt continues to voice fear of mobility which along with global weakness seems to be primary barriers.  She voices every few moments that she is scared.  Encouragement and education given.  She frequently asks to lay back down.  "Help me" is also repeated frequently during session.   Follow Up Recommendations  SNF     Equipment Recommendations  Rolling walker with 5" wheels;3in1 (PT);Wheelchair (measurements PT);Wheelchair cushion (measurements PT)    Recommendations for Other Services       Precautions / Restrictions Precautions Precautions: Fall Restrictions Weight Bearing Restrictions: Yes LLE Weight Bearing: Partial weight bearing    Mobility  Bed Mobility Overal bed mobility: Needs Assistance Bed Mobility: Supine to Sit;Sit to Supine     Supine to sit: Max assist;HOB elevated Sit to supine: Total assist   General bed mobility comments: assist for trunk and B LE's  Transfers Overall transfer level: Needs assistance Equipment used: Rolling  walker (2 wheeled) Transfers: Sit to/from Stand Sit to Stand: Mod assist         General transfer comment: stood x 2 with mod a x 1 and pulling up on RW.  Unable to come to a complete stand and requires constant assist and hands on support to remian standing.  Ambulation/Gait             General Gait Details: unable to safely attempt steps   Stairs             Wheelchair Mobility    Modified Rankin (Stroke Patients Only)       Balance Overall balance assessment: Needs assistance Sitting-balance support: Feet supported;Bilateral upper extremity supported Sitting balance-Leahy Scale: Fair     Standing balance support: Bilateral upper extremity supported Standing balance-Leahy Scale: Poor                              Cognition Arousal/Alertness: Awake/alert Behavior During Therapy: WFL for tasks assessed/performed Overall Cognitive Status: Within Functional Limits for tasks assessed                                        Exercises General Exercises - Lower Extremity Ankle Circles/Pumps: AAROM;Strengthening;Both;10 reps;Supine Short Arc Quad: AROM;Both;10 reps;Seated Heel Slides: AAROM;Strengthening;Left;10 reps;Supine Hip ABduction/ADduction: AAROM;Strengthening;Left;10 reps;Supine Hip Flexion/Marching: AROM;Seated;Both;10 reps Other Exercises Other Exercises: sat EOB x 5 minutes before standing  General Comments        Pertinent Vitals/Pain Pain Assessment: Faces Faces Pain Scale: Hurts even more Pain Location: L hip/thigh, Pain Descriptors / Indicators: Grimacing;Moaning;Guarding Pain Intervention(s): Limited activity within patient's tolerance;Monitored during session;Repositioned    Home Living                      Prior Function            PT Goals (current goals can now be found in the care plan section) Progress towards PT goals: Progressing toward goals    Frequency    7X/week      PT  Plan Current plan remains appropriate    Co-evaluation              AM-PAC PT "6 Clicks" Mobility   Outcome Measure  Help needed turning from your back to your side while in a flat bed without using bedrails?: A Lot Help needed moving from lying on your back to sitting on the side of a flat bed without using bedrails?: Total Help needed moving to and from a bed to a chair (including a wheelchair)?: Total Help needed standing up from a chair using your arms (e.g., wheelchair or bedside chair)?: A Lot Help needed to walk in hospital room?: Total Help needed climbing 3-5 steps with a railing? : Total 6 Click Score: 8    End of Session Equipment Utilized During Treatment: Gait belt;Oxygen Activity Tolerance: Patient limited by fatigue Patient left: in bed;with call bell/phone within reach;with SCD's reapplied Nurse Communication: Mobility status;Precautions;Weight bearing status Pain - Right/Left: Left Pain - part of body: Hip     Time: 7867-6720 PT Time Calculation (min) (ACUTE ONLY): 23 min  Charges:  $Therapeutic Exercise: 8-22 mins $Therapeutic Activity: 8-22 mins                    Danielle Dess, PTA 04/28/20, 11:03 AM

## 2020-04-28 NOTE — Progress Notes (Signed)
Subjective: 5 Days Post-Op Procedure(s) (LRB): INTRAMEDULLARY (IM) NAIL INTERTROCHANTRIC (Left)   Patient is comfortable and waiting on insurance approval for transfer back to [facility].  Dressing is dry.  Her son is at the bedside.  She is minimal progress with PT yet.   Patient reports pain as mild.  Objective:   VITALS:   Vitals:   04/28/20 1131 04/28/20 1627  BP: (!) 132/59 (!) 143/59  Pulse: 75 76  Resp: 18 15  Temp: (!) 97.4 F (36.3 C) 97.7 F (36.5 C)  SpO2: 100% 100%    Sensation intact distally Dorsiflexion/Plantar flexion intact Incision: no drainage and scant drainage  LABS Recent Labs    04/26/20 0428  HGB 10.0*  HCT 29.7*  WBC 10.5  PLT 321    No results for input(s): NA, K, BUN, CREATININE, GLUCOSE in the last 72 hours.  No results for input(s): LABPT, INR in the last 72 hours.   Assessment/Plan: 5 Days Post-Op Procedure(s) (LRB): INTRAMEDULLARY (IM) NAIL INTERTROCHANTRIC (Left)   Up with therapy Discharge to SNF and insurance settled.  Continue aspirin and Plavix  Partial weightbearing left leg  Return to clinic 2 weeks for x-ray and staple removal

## 2020-04-29 LAB — SARS CORONAVIRUS 2 BY RT PCR (HOSPITAL ORDER, PERFORMED IN ~~LOC~~ HOSPITAL LAB): SARS Coronavirus 2: NEGATIVE

## 2020-04-29 MED ORDER — CEPHALEXIN 250 MG/5ML PO SUSR: 500.0000 mg | Freq: Two times a day (BID) | ORAL | 0 refills | Status: AC

## 2020-04-29 NOTE — Progress Notes (Signed)
PROGRESS NOTE    Christina Mooney  ZOX:[id number] DOB: 1926-01-13 DOA: 04/21/2020 PCP: Karie Schwalbe, MD   Chief Complaint  Patient presents with   Fall   Brief Narrative: 86 year old female with history of GERD, dementia, carotid artery stenosis on Plavix, hyperlipidemia, hypertension, fibromuscular dysplasia presented to the ED after a fall at the skilled nursing facility after she tripped and fell on her left side. In the ED vitals are stable,Labs include:Sodium 135,potassium 3.4,chloride 100,bicarb 21,glucose 158,BUN 20,creatinine 0.67,calcium 9.7,and anion gap 14,alkaline phosphatase 59,AST 32,ALT 30,total bilirubin 1.0,WBC 11.1,hemoglobin 12.2,hematocrit 36.7,platelet 349,influenza negative,Covid negative. CT head:Marland Kitchen Laceration is seen involving the left parietal scalp near the vertex with overlying hematoma. 2. Mild diffuse cortical atrophy. Mild chronic ischemic white matter disease. No acute intracranial abnormality seen. CT hip left:There is a displaced intertrochanteric fracture of the left femur. There are additional fractures of the left pubic symphysis, which appear acute, and additional chronic fracture deformities of the bilateral pubic rami, which were present on prior radiographs. Partially imaged plate and screw fixation of the distal left femur. Osteopenia. Seen by orthopedic and underwent operative intervention on 04/23/20. Postop acute blood loss anemia noted 12/15 needing 1 unit PRBC and hemoglobin has improved to 10 g since then.  She remains weak frail, seen by pt ot and Palliative care Plan is to cont w/ SNF for now. Advised palliative care discussion at SNF Pt was awaiting on SNF Authorization.  Subjective: Alert awake oriented at baseline.  Son at the bedside.  No acute events overnight.  Assessment & Plan:  Closed left subtrochanteric femur fracture status post operative repair 12/14 by orthopedics.  Medically stable for discharge back  to skilled nursing facility.  Patient is waiting for SNF authorization and has been obtained today.  Low-dose tramadol for pain control.  She will resume aspirin/Plavix for DVT prophylaxis as per orthopedics.   Anemia multifactorial from acute blood loss in the setting of Plavix/hip fracture and also with iron deficiency: Recent monitored PRBC.  Hemoglobin is stable.  Continue iron supplementation Recent Labs  Lab 04/23/20 0348 04/24/20 0534 04/24/20 1648 04/25/20 0426 04/26/20 0428  HGB 7.9* 7.1* 9.1* 9.6* 10.0*  HCT 24.3* 21.8* 26.7* 27.5* 29.7*   Adult failure to thrive with severe protein calorie malnutrition with mild dementia/cognitive impairment and decline over past years/physical deconditioning, poor po intake: Seen by palliative care.  Encourage oral intake.    Acute cystitis/UTI with E. Coli-she will be completing Keflex today.    Scalp laceration from fall stapled in the ER. Cont wound care.  Constipation cont as needed laxatives   Essential hypertension: BP stable on Toprol   Neuropathy cont gabapentin  GERD: PPI to cont  Carotid artery stenosis/Aortic atherosclerosis/Hyperlipidemia- cotn her asa,plavis  Hypokalemia repleted.  Goals of care: Seen by palliative care.  Advised ongoing discussion and follow-up with palliative care at the skilled nursing facility.  Prognosis does not appear bright in her current setting with overlays hip fracture multiple comorbidities.  Nutrition: Diet Order            DIET DYS 3 Room service appropriate? Yes with Assist; Fluid consistency: Thin  Diet effective now                 Nutrition Problem: Severe Malnutrition Etiology: social / environmental circumstances (advanced age, suspected inadequate oral intake) Signs/Symptoms: severe fat depletion,severe muscle depletion Interventions: Refer to RD note for recommendations  Body mass index is 18.38 kg/m.  DVT prophylaxis: SCDs Start: 04/23/20 1702 asa/plavix  Code  Status:   Code Status: Full Code  Family Communication: plan of care discussed with patient at bedside.  Status is: Inpatient She is being discharged back to the facility today.  Discharge summary has been updated.   Consultants:see note  Procedures:see note  Culture/Microbiology    Component Value Date/Time   SDES  04/21/2020 2016    URINE, CATHETERIZED Performed at [facility], [address] [place], [place] [address]    [facility]  04/21/2020 2016    NONE Performed at [facility], [address] [place], [place] [address]    CULT >=100,000 COLONIES/mL ESCHERICHIA COLI (A) 04/21/2020 2016   REPTSTATUS 04/24/2020 FINAL 04/21/2020 2016    Other culture-see note  Medications: Scheduled Meds:  sodium chloride   Intravenous Once   acetaminophen  650 mg Oral TID   aspirin EC  81 mg Oral Daily   cephALEXin  500 mg Oral Q12H   clopidogrel  75 mg Oral Daily   docusate sodium  100 mg Oral BID   feeding supplement  237 mL Oral TID BM   ferrous sulfate  325 mg Oral Q breakfast   metoprolol succinate  12.5 mg Oral Daily   pantoprazole  40 mg Oral Daily   polyethylene glycol  17 g Oral Daily   senna  1 tablet Oral BID   Continuous Infusions:  sodium chloride 75 mL/hr at 04/23/20 1834    Antimicrobials: Anti-infectives (From admission, onward)   Start     Dose/Rate Route Frequency Ordered Stop   04/29/20 0000  cephALEXin (KEFLEX) 250 MG/5ML suspension        500 mg Oral Every 12 hours 04/29/20 1007 04/30/20 2359   04/27/20 2100  cephALEXin (KEFLEX) 250 MG/5ML suspension 500 mg        500 mg Oral Every 12 hours 04/27/20 1053 04/30/20 0459   04/27/20 0000  cephALEXin (KEFLEX) 500 MG capsule  Status:  Discontinued        500 mg Oral Every 12 hours 04/27/20 0910 04/27/20    04/27/20 0000  cephALEXin (KEFLEX) 250 MG/5ML suspension  Status:  Discontinued        500 mg Oral Every 12 hours 04/27/20 1054 04/29/20    04/26/20 1800  cephALEXin  (KEFLEX) capsule 500 mg  Status:  Discontinued        500 mg Oral Every 12 hours 04/26/20 1503 04/27/20 1053   04/24/20 1800  ceFAZolin (ANCEF) IVPB 1 g/50 mL premix  Status:  Discontinued        1 g 100 mL/hr over 30 Minutes Intravenous Every 12 hours 04/24/20 0935 04/26/20 1503   04/24/20 1400  ceFAZolin (ANCEF) IVPB 1 g/50 mL premix  Status:  Discontinued        1 g 100 mL/hr over 30 Minutes Intravenous Every 8 hours 04/24/20 0932 04/24/20 0933   04/24/20 0600  ceFAZolin (ANCEF) IVPB 2g/100 mL premix        2 g 200 mL/hr over 30 Minutes Intravenous On call to O.R. 04/23/20 1222 04/23/20 1345   04/24/20 0600  clindamycin (CLEOCIN) IVPB 600 mg        600 mg 100 mL/hr over 30 Minutes Intravenous On call to O.R. 04/23/20 1222 04/23/20 1340   04/23/20 2200  ceFAZolin (ANCEF) IVPB 2g/100 mL premix  Status:  Discontinued        2 g 200 mL/hr over 30 Minutes Intravenous Every 8 hours 04/23/20 1702 04/24/20 0932   04/23/20 2200  clindamycin (CLEOCIN)  IVPB 600 mg        600 mg 100 mL/hr over 30 Minutes Intravenous Every 8 hours 04/23/20 1702 04/24/20 1703   04/23/20 1341  gentamicin 80 mg in 0.9% sodium chloride 250 mL irrigation  Status:  Discontinued          As needed 04/23/20 1406 04/23/20 1434   04/23/20 1230  clindamycin (CLEOCIN) 600 MG/50ML IVPB       Note to Pharmacy: Renie Ora   : cabinet override      04/23/20 1230 04/23/20 1326   04/23/20 1230  ceFAZolin (ANCEF) 2-4 GM/100ML-% IVPB       Note to Pharmacy: Renie Ora   : cabinet override      04/23/20 1230 04/23/20 1326   04/22/20 1400  cephALEXin (KEFLEX) capsule 250 mg  Status:  Discontinued        250 mg Oral Every 8 hours 04/22/20 1230 04/23/20 1702   04/22/20 0600  ceFAZolin (ANCEF) IVPB 2g/100 mL premix        2 g 200 mL/hr over 30 Minutes Intravenous On call to O.R. 04/21/20 1429 04/23/20 0559   04/22/20 0600  clindamycin (CLEOCIN) IVPB 600 mg        600 mg 100 mL/hr over 30 Minutes Intravenous On call to O.R.  04/21/20 1429 04/23/20 0559     Objective: Vitals: Today's Vitals   04/28/20 2349 04/29/20 0438 04/29/20 0721 04/29/20 0748  BP: (!) 131/52 (!) 146/64 (!) 118/59   Pulse: 78 80 83   Resp: 16 16 16    Temp: 97.8 F (36.6 C) (!) 97.5 F (36.4 C) 98.3 F (36.8 C)   TempSrc:   Oral   SpO2: 100% 100% 100%   Weight:      Height:      PainSc:    Asleep    Intake/Output Summary (Last 24 hours) at 04/29/2020 1008 Last data filed at 04/29/2020 0440 Gross per 24 hour  Intake --  Output 575 ml  Net -575 ml   Filed Weights   04/21/20 1046  Weight: 41.3 kg   Weight change:   Intake/Output from previous day: 12/19 0701 - 12/20 0700 In: -  Out: 575 [Urine:575] Intake/Output this shift: No intake/output data recorded.  Examination: General exam: AAO, elderly, frail,NAD, weak appearing. HEENT:Oral mucosa moist, Ear/Nose WNL grossly,dentition normal. Respiratory system: bilaterally clear,no wheezing or crackles,no use of accessory muscle, non tender. Cardiovascular system: S1 & S2 +, regular, No JVD. Gastrointestinal system: Abdomen soft, NT,ND, BS+. Nervous System:Alert, awake, moving extremities and grossly nonfocal Extremities: No edema, distal peripheral pulses palpable.  Skin: No rashes,no icterus. MSK: Normal muscle bulk,tone, power Left hip surgical site with dressing intact.  Data Reviewed: I have personally reviewed following labs and imaging studies CBC: Recent Labs  Lab 04/23/20 0348 04/24/20 0534 04/24/20 1648 04/25/20 0426 04/26/20 0428  WBC 11.2* 10.4  --  9.8 10.5  HGB 7.9* 7.1* 9.1* 9.6* 10.0*  HCT 24.3* 21.8* 26.7* 27.5* 29.7*  MCV 86.2 87.2  --  85.7 87.1  PLT 254 259  --  284 321   Basic Metabolic Panel: Recent Labs  Lab 04/23/20 0348 04/24/20 0534 04/25/20 0426  NA 143 142 142  K 3.2* 3.5 3.1*  CL 110 108 107  CO2 22 23 26   GLUCOSE 84 118* 99  BUN 20 18 15   CREATININE 0.49 0.54 0.42*  CALCIUM 8.7* 8.4* 8.4*  MG 2.2  --   --   PHOS 2.2*   --   --  GFR: Estimated Creatinine Clearance: 29.3 mL/min (A) (by C-G formula based on SCr of 0.42 mg/dL (L)). Liver Function Tests: No results for input(s): AST, ALT, ALKPHOS, BILITOT, PROT, ALBUMIN in the last 168 hours. No results for input(s): LIPASE, AMYLASE in the last 168 hours. No results for input(s): AMMONIA in the last 168 hours. Coagulation Profile: No results for input(s): INR, PROTIME in the last 168 hours. Cardiac Enzymes: No results for input(s): CKTOTAL, CKMB, CKMBINDEX, TROPONINI in the last 168 hours. BNP (last 3 results) No results for input(s): PROBNP in the last 8760 hours. HbA1C: No results for input(s): HGBA1C in the last 72 hours. CBG: No results for input(s): GLUCAP in the last 168 hours. Lipid Profile: No results for input(s): CHOL, HDL, LDLCALC, TRIG, CHOLHDL, LDLDIRECT in the last 72 hours. Thyroid Function Tests: No results for input(s): TSH, T4TOTAL, FREET4, T3FREE, THYROIDAB in the last 72 hours. Anemia Panel: No results for input(s): VITAMINB12, FOLATE, FERRITIN, TIBC, IRON, RETICCTPCT in the last 72 hours. Sepsis Labs: No results for input(s): PROCALCITON, LATICACIDVEN in the last 168 hours.  Recent Results (from the past 240 hour(s))  Resp Panel by RT-PCR (Flu A&B, Covid) Nasopharyngeal Swab     Status: None   Collection Time: 04/21/20  2:09 PM   Specimen: Nasopharyngeal Swab; Nasopharyngeal(NP) swabs in vial transport medium  Result Value Ref Range Status   SARS Coronavirus 2 by RT PCR NEGATIVE NEGATIVE Final    Comment: (NOTE) SARS-CoV-2 target nucleic acids are NOT DETECTED.  The SARS-CoV-2 RNA is generally detectable in upper respiratory specimens during the acute phase of infection. The lowest concentration of SARS-CoV-2 viral copies this assay can detect is 138 copies/mL. A negative result does not preclude SARS-Cov-2 infection and should not be used as the sole basis for treatment or other patient management decisions. A negative  result may occur with  improper specimen collection/handling, submission of specimen other than nasopharyngeal swab, presence of viral mutation(s) within the areas targeted by this assay, and inadequate number of viral copies(<138 copies/mL). A negative result must be combined with clinical observations, patient history, and epidemiological information. The expected result is Negative.  Fact Sheet for Patients:  BloggerCourse.com  Fact Sheet for Healthcare Providers:  SeriousBroker.it  This test is no t yet approved or cleared by the [place] FDA and  has been authorized for detection and/or diagnosis of SARS-CoV-2 by FDA under an Emergency Use Authorization (EUA). This EUA will remain  in effect (meaning this test can be used) for the duration of the COVID-19 declaration under Section 564(b)(1) of the Act, 21 U.S.C.section 360bbb-3(b)(1), unless the authorization is terminated  or revoked sooner.       Influenza A by PCR NEGATIVE NEGATIVE Final   Influenza B by PCR NEGATIVE NEGATIVE Final    Comment: (NOTE) The Xpert Xpress SARS-CoV-2/FLU/RSV plus assay is intended as an aid in the diagnosis of influenza from Nasopharyngeal swab specimens and should not be used as a sole basis for treatment. Nasal washings and aspirates are unacceptable for Xpert Xpress SARS-CoV-2/FLU/RSV testing.  Fact Sheet for Patients: BloggerCourse.com  Fact Sheet for Healthcare Providers: SeriousBroker.it  This test is not yet approved or cleared by the [place] FDA and has been authorized for detection and/or diagnosis of SARS-CoV-2 by FDA under an Emergency Use Authorization (EUA). This EUA will remain in effect (meaning this test can be used) for the duration of the COVID-19 declaration under Section 564(b)(1) of the Act, 21 U.S.C. section 360bbb-3(b)(1), unless the authorization is  terminated or revoked.  Performed at [facility], [address], [place], [place] [address]   Urine Culture     Status: Abnormal   Collection Time: 04/21/20  8:16 PM   Specimen: Urine, Catheterized  Result Value Ref Range Status   Specimen Description   Final    URINE, CATHETERIZED Performed at [facility], [address] [place], [place] [address]    Special Requests   Final    NONE Performed at [facility], [address] [place], [place] [address]    Culture >=100,000 COLONIES/mL ESCHERICHIA COLI (A)  Final   Report Status 04/24/2020 FINAL  Final   Organism ID, Bacteria ESCHERICHIA COLI (A)  Final      Susceptibility   Escherichia coli - MIC*    AMPICILLIN >=32 RESISTANT Resistant     CEFAZOLIN <=4 SENSITIVE Sensitive     CEFEPIME <=0.12 SENSITIVE Sensitive     CEFTRIAXONE <=0.25 SENSITIVE Sensitive     CIPROFLOXACIN <=0.25 SENSITIVE Sensitive     GENTAMICIN <=1 SENSITIVE Sensitive     IMIPENEM <=0.25 SENSITIVE Sensitive     NITROFURANTOIN <=16 SENSITIVE Sensitive     TRIMETH/SULFA <=20 SENSITIVE Sensitive     AMPICILLIN/SULBACTAM >=32 RESISTANT Resistant     PIP/TAZO <=4 SENSITIVE Sensitive     * >=100,000 COLONIES/mL ESCHERICHIA COLI  MRSA PCR Screening     Status: None   Collection Time: 04/22/20  9:03 AM   Specimen: Nasal Mucosa; Nasopharyngeal  Result Value Ref Range Status   MRSA by PCR NEGATIVE NEGATIVE Final    Comment:        The GeneXpert MRSA Assay (FDA approved for NASAL specimens only), is one component of a comprehensive MRSA colonization surveillance program. It is not intended to diagnose MRSA infection nor to guide or monitor treatment for MRSA infections. Performed at [facility], [address] [place], [place] [address]      Radiology Studies: No results found.   LOS: 8 days   Lanae Boast, MD [facility]  04/29/2020, 10:08 AM

## 2020-04-29 NOTE — Progress Notes (Signed)
Patient is stable and ready for discharge back to [facility]. Patient's IV removed. Patient's son Lorin Picket at bedside and packed patient's belongings. Writer called report and spoke with receiving nurse Elnita Maxwell and she verbalized understanding of report provided. Patient is transporting via EMS [facility].

## 2020-04-29 NOTE — Care Management Important Message (Signed)
Important Message  Patient Details  Name: Christina Mooney MRN: [id number] Date of Birth: 1926-01-01   Medicare Important Message Given:  Yes     [name] [name] 04/29/2020, 10:16 AM

## 2020-04-29 NOTE — TOC Transition Note (Signed)
Transition of Care [facility]) - CM/SW Discharge Note   Patient Details  Name: Christina Mooney MRN: [id number] Date of Birth: 12/25/1925  [facility]) CM/SW Contact:  Liliana Cline, LCSW Phone Number: 04/29/2020, 10:01 AM   Clinical Narrative:   Patient to discharge to [facility] today, Room 418. Confirmed with Sue Lush at [facility]. CSW updated MD, RN, patient/family. Asked RN to call report and MD to submit DC Summary and order Rapid COVID test. Medical Necessity Form and Face Sheet placed in Discharge Packet by patient chart. EMS transport arranged for 12:30 pick up with [facility] to allow time for COVID test results. No other needs identified prior to discharge.     Final next level of care: Skilled Nursing Facility Barriers to Discharge: Barriers Resolved   Patient Goals and CMS Choice Patient states their goals for this hospitalization and ongoing recovery are:: SNF rehab then transition back to long term CMS Medicare.gov Compare Post Acute Care list provided to:: Patient Represenative (must comment) Choice offered to / list presented to : Adult Children  Discharge Placement              Patient chooses bed at: [facility] Patient to be transferred to facility by: [facility] Name of family member notified: Lorin Picket (son) Patient and family notified of of transfer: 04/29/20  Discharge Plan and Services                                     Social Determinants of Health (SDOH) Interventions     Readmission Risk Interventions No flowsheet data found.

## 2020-04-30 DIAGNOSIS — L89159 Pressure ulcer of sacral region, unspecified stage: Secondary | ICD-10-CM

## 2020-04-30 DIAGNOSIS — F05 Delirium due to known physiological condition: Secondary | ICD-10-CM

## 2020-04-30 DIAGNOSIS — I10 Essential (primary) hypertension: Secondary | ICD-10-CM

## 2020-04-30 DIAGNOSIS — S7292XA Unspecified fracture of left femur, initial encounter for closed fracture: Secondary | ICD-10-CM

## 2020-04-30 DIAGNOSIS — N3000 Acute cystitis without hematuria: Secondary | ICD-10-CM | POA: Diagnosis not present

## 2020-04-30 DIAGNOSIS — K219 Gastro-esophageal reflux disease without esophagitis: Secondary | ICD-10-CM

## 2020-05-14 DIAGNOSIS — S7292XA Unspecified fracture of left femur, initial encounter for closed fracture: Secondary | ICD-10-CM

## 2020-05-14 DIAGNOSIS — I739 Peripheral vascular disease, unspecified: Secondary | ICD-10-CM | POA: Diagnosis not present

## 2020-05-14 DIAGNOSIS — I209 Angina pectoris, unspecified: Secondary | ICD-10-CM | POA: Diagnosis not present

## 2020-05-14 DIAGNOSIS — E441 Mild protein-calorie malnutrition: Secondary | ICD-10-CM | POA: Diagnosis not present

## 2020-05-14 DIAGNOSIS — L98411 Non-pressure chronic ulcer of buttock limited to breakdown of skin: Secondary | ICD-10-CM

## 2020-07-17 DIAGNOSIS — I25119 Atherosclerotic heart disease of native coronary artery with unspecified angina pectoris: Secondary | ICD-10-CM | POA: Diagnosis not present

## 2020-07-17 DIAGNOSIS — S7292XA Unspecified fracture of left femur, initial encounter for closed fracture: Secondary | ICD-10-CM

## 2020-07-17 DIAGNOSIS — I5030 Unspecified diastolic (congestive) heart failure: Secondary | ICD-10-CM | POA: Diagnosis not present

## 2020-07-17 DIAGNOSIS — E43 Unspecified severe protein-calorie malnutrition: Secondary | ICD-10-CM | POA: Diagnosis not present

## 2020-07-17 DIAGNOSIS — I739 Peripheral vascular disease, unspecified: Secondary | ICD-10-CM

## 2020-07-17 DIAGNOSIS — M545 Low back pain, unspecified: Secondary | ICD-10-CM | POA: Diagnosis not present

## 2020-07-17 DIAGNOSIS — M810 Age-related osteoporosis without current pathological fracture: Secondary | ICD-10-CM

## 2020-07-17 DIAGNOSIS — K219 Gastro-esophageal reflux disease without esophagitis: Secondary | ICD-10-CM

## 2020-09-04 DIAGNOSIS — B351 Tinea unguium: Secondary | ICD-10-CM | POA: Diagnosis not present

## 2020-11-08 DIAGNOSIS — I5030 Unspecified diastolic (congestive) heart failure: Secondary | ICD-10-CM

## 2020-11-08 DIAGNOSIS — M810 Age-related osteoporosis without current pathological fracture: Secondary | ICD-10-CM

## 2020-11-08 DIAGNOSIS — K219 Gastro-esophageal reflux disease without esophagitis: Secondary | ICD-10-CM

## 2020-11-08 DIAGNOSIS — I739 Peripheral vascular disease, unspecified: Secondary | ICD-10-CM

## 2020-11-08 DIAGNOSIS — D649 Anemia, unspecified: Secondary | ICD-10-CM

## 2020-11-08 DIAGNOSIS — F0150 Vascular dementia without behavioral disturbance: Secondary | ICD-10-CM | POA: Diagnosis not present

## 2020-11-08 DIAGNOSIS — E43 Unspecified severe protein-calorie malnutrition: Secondary | ICD-10-CM | POA: Diagnosis not present

## 2020-11-08 DIAGNOSIS — I10 Essential (primary) hypertension: Secondary | ICD-10-CM

## 2020-11-08 DIAGNOSIS — I2510 Atherosclerotic heart disease of native coronary artery without angina pectoris: Secondary | ICD-10-CM

## 2020-12-02 DIAGNOSIS — M25572 Pain in left ankle and joints of left foot: Secondary | ICD-10-CM

## 2020-12-02 DIAGNOSIS — R0782 Intercostal pain: Secondary | ICD-10-CM

## 2020-12-02 DIAGNOSIS — M810 Age-related osteoporosis without current pathological fracture: Secondary | ICD-10-CM

## 2020-12-02 DIAGNOSIS — M25551 Pain in right hip: Secondary | ICD-10-CM

## 2020-12-02 DIAGNOSIS — Y92129 Unspecified place in nursing home as the place of occurrence of the external cause: Secondary | ICD-10-CM

## 2021-01-16 DIAGNOSIS — I739 Peripheral vascular disease, unspecified: Secondary | ICD-10-CM | POA: Diagnosis not present

## 2021-01-16 DIAGNOSIS — F0150 Vascular dementia without behavioral disturbance: Secondary | ICD-10-CM | POA: Diagnosis not present

## 2021-01-16 DIAGNOSIS — I209 Angina pectoris, unspecified: Secondary | ICD-10-CM | POA: Diagnosis not present

## 2021-01-16 DIAGNOSIS — E441 Mild protein-calorie malnutrition: Secondary | ICD-10-CM

## 2021-01-16 DIAGNOSIS — I5032 Chronic diastolic (congestive) heart failure: Secondary | ICD-10-CM | POA: Diagnosis not present

## 2021-01-16 DIAGNOSIS — M48061 Spinal stenosis, lumbar region without neurogenic claudication: Secondary | ICD-10-CM

## 2021-03-14 DIAGNOSIS — M48062 Spinal stenosis, lumbar region with neurogenic claudication: Secondary | ICD-10-CM

## 2021-03-14 DIAGNOSIS — I5030 Unspecified diastolic (congestive) heart failure: Secondary | ICD-10-CM | POA: Diagnosis not present

## 2021-03-14 DIAGNOSIS — F0150 Vascular dementia without behavioral disturbance: Secondary | ICD-10-CM | POA: Diagnosis not present

## 2021-03-14 DIAGNOSIS — K219 Gastro-esophageal reflux disease without esophagitis: Secondary | ICD-10-CM

## 2021-03-14 DIAGNOSIS — I25119 Atherosclerotic heart disease of native coronary artery with unspecified angina pectoris: Secondary | ICD-10-CM | POA: Diagnosis not present

## 2021-03-14 DIAGNOSIS — D699 Hemorrhagic condition, unspecified: Secondary | ICD-10-CM

## 2021-03-14 DIAGNOSIS — I359 Nonrheumatic aortic valve disorder, unspecified: Secondary | ICD-10-CM

## 2021-03-14 DIAGNOSIS — E43 Unspecified severe protein-calorie malnutrition: Secondary | ICD-10-CM | POA: Diagnosis not present

## 2021-05-26 DIAGNOSIS — E441 Mild protein-calorie malnutrition: Secondary | ICD-10-CM

## 2021-05-26 DIAGNOSIS — I208 Other forms of angina pectoris: Secondary | ICD-10-CM | POA: Diagnosis not present

## 2021-05-26 DIAGNOSIS — M48061 Spinal stenosis, lumbar region without neurogenic claudication: Secondary | ICD-10-CM | POA: Diagnosis not present

## 2021-05-26 DIAGNOSIS — I5021 Acute systolic (congestive) heart failure: Secondary | ICD-10-CM

## 2021-05-26 DIAGNOSIS — I5032 Chronic diastolic (congestive) heart failure: Secondary | ICD-10-CM | POA: Diagnosis not present

## 2021-05-26 DIAGNOSIS — K219 Gastro-esophageal reflux disease without esophagitis: Secondary | ICD-10-CM

## 2021-05-26 DIAGNOSIS — F0150 Vascular dementia without behavioral disturbance: Secondary | ICD-10-CM | POA: Diagnosis not present

## 2021-06-25 DIAGNOSIS — Y92129 Unspecified place in nursing home as the place of occurrence of the external cause: Secondary | ICD-10-CM

## 2021-06-25 DIAGNOSIS — M79621 Pain in right upper arm: Secondary | ICD-10-CM | POA: Diagnosis not present

## 2021-06-25 DIAGNOSIS — M25511 Pain in right shoulder: Secondary | ICD-10-CM | POA: Diagnosis not present

## 2021-06-25 DIAGNOSIS — M25559 Pain in unspecified hip: Secondary | ICD-10-CM | POA: Diagnosis not present

## 2021-06-25 DIAGNOSIS — M79606 Pain in leg, unspecified: Secondary | ICD-10-CM | POA: Diagnosis not present

## 2021-07-09 DIAGNOSIS — F0150 Vascular dementia without behavioral disturbance: Secondary | ICD-10-CM | POA: Diagnosis not present

## 2021-07-09 DIAGNOSIS — M48061 Spinal stenosis, lumbar region without neurogenic claudication: Secondary | ICD-10-CM

## 2021-07-09 DIAGNOSIS — I739 Peripheral vascular disease, unspecified: Secondary | ICD-10-CM

## 2021-07-09 DIAGNOSIS — I10 Essential (primary) hypertension: Secondary | ICD-10-CM

## 2021-07-09 DIAGNOSIS — E43 Unspecified severe protein-calorie malnutrition: Secondary | ICD-10-CM | POA: Diagnosis not present

## 2021-07-09 DIAGNOSIS — I5030 Unspecified diastolic (congestive) heart failure: Secondary | ICD-10-CM | POA: Diagnosis not present

## 2021-07-09 DIAGNOSIS — M810 Age-related osteoporosis without current pathological fracture: Secondary | ICD-10-CM

## 2021-07-09 DIAGNOSIS — D649 Anemia, unspecified: Secondary | ICD-10-CM

## 2021-07-09 DIAGNOSIS — I25119 Atherosclerotic heart disease of native coronary artery with unspecified angina pectoris: Secondary | ICD-10-CM | POA: Diagnosis not present

## 2021-07-09 DIAGNOSIS — K219 Gastro-esophageal reflux disease without esophagitis: Secondary | ICD-10-CM

## 2021-09-23 DIAGNOSIS — S60221A Contusion of right hand, initial encounter: Secondary | ICD-10-CM | POA: Diagnosis not present

## 2021-11-13 DIAGNOSIS — M79605 Pain in left leg: Secondary | ICD-10-CM | POA: Diagnosis not present

## 2021-11-14 DIAGNOSIS — D649 Anemia, unspecified: Secondary | ICD-10-CM

## 2021-11-14 DIAGNOSIS — F0150 Vascular dementia without behavioral disturbance: Secondary | ICD-10-CM | POA: Diagnosis not present

## 2021-11-14 DIAGNOSIS — I5030 Unspecified diastolic (congestive) heart failure: Secondary | ICD-10-CM

## 2021-11-14 DIAGNOSIS — E43 Unspecified severe protein-calorie malnutrition: Secondary | ICD-10-CM

## 2021-11-14 DIAGNOSIS — I2510 Atherosclerotic heart disease of native coronary artery without angina pectoris: Secondary | ICD-10-CM

## 2021-11-14 DIAGNOSIS — I10 Essential (primary) hypertension: Secondary | ICD-10-CM

## 2021-11-14 DIAGNOSIS — B351 Tinea unguium: Secondary | ICD-10-CM | POA: Diagnosis not present

## 2021-11-14 DIAGNOSIS — K219 Gastro-esophageal reflux disease without esophagitis: Secondary | ICD-10-CM

## 2021-11-14 DIAGNOSIS — M5416 Radiculopathy, lumbar region: Secondary | ICD-10-CM | POA: Diagnosis not present

## 2021-11-14 DIAGNOSIS — I739 Peripheral vascular disease, unspecified: Secondary | ICD-10-CM | POA: Diagnosis not present

## 2021-11-14 DIAGNOSIS — L03211 Cellulitis of face: Secondary | ICD-10-CM | POA: Diagnosis not present

## 2022-01-15 DIAGNOSIS — F0150 Vascular dementia without behavioral disturbance: Secondary | ICD-10-CM

## 2022-01-15 DIAGNOSIS — I5032 Chronic diastolic (congestive) heart failure: Secondary | ICD-10-CM

## 2022-01-15 DIAGNOSIS — K219 Gastro-esophageal reflux disease without esophagitis: Secondary | ICD-10-CM

## 2022-01-15 DIAGNOSIS — E441 Mild protein-calorie malnutrition: Secondary | ICD-10-CM

## 2022-01-15 DIAGNOSIS — M4807 Spinal stenosis, lumbosacral region: Secondary | ICD-10-CM

## 2022-02-18 ENCOUNTER — Non-Acute Institutional Stay (SKILLED_NURSING_FACILITY): Admitting: Student

## 2022-02-18 ENCOUNTER — Encounter: Payer: Self-pay | Admitting: Student

## 2022-02-18 DIAGNOSIS — Z515 Encounter for palliative care: Secondary | ICD-10-CM | POA: Diagnosis not present

## 2022-02-18 DIAGNOSIS — I739 Peripheral vascular disease, unspecified: Secondary | ICD-10-CM | POA: Insufficient documentation

## 2022-02-18 DIAGNOSIS — Z66 Do not resuscitate: Secondary | ICD-10-CM | POA: Insufficient documentation

## 2022-02-18 DIAGNOSIS — I509 Heart failure, unspecified: Secondary | ICD-10-CM | POA: Insufficient documentation

## 2022-02-18 DIAGNOSIS — K5790 Diverticulosis of intestine, part unspecified, without perforation or abscess without bleeding: Secondary | ICD-10-CM | POA: Insufficient documentation

## 2022-02-18 DIAGNOSIS — M810 Age-related osteoporosis without current pathological fracture: Secondary | ICD-10-CM | POA: Insufficient documentation

## 2022-02-18 DIAGNOSIS — L601 Onycholysis: Secondary | ICD-10-CM | POA: Diagnosis not present

## 2022-02-18 DIAGNOSIS — K589 Irritable bowel syndrome without diarrhea: Secondary | ICD-10-CM | POA: Insufficient documentation

## 2022-02-18 DIAGNOSIS — M1990 Unspecified osteoarthritis, unspecified site: Secondary | ICD-10-CM | POA: Insufficient documentation

## 2022-02-18 DIAGNOSIS — K7689 Other specified diseases of liver: Secondary | ICD-10-CM | POA: Insufficient documentation

## 2022-02-18 NOTE — Progress Notes (Signed)
Location:  Other Nursing Home Room Number: 418 Place of Service:  SNF (31) Provider:  Judeth Horn, MD  Patient Care Team: Earnestine Mealing, MD as PCP - [facility])  Extended Emergency Contact Information Primary Emergency Contact: Hildreth,[name] Address: [address]          [place], [place] [address] Darden Amber of [place] Home Phone: [phone] Mobile Phone: [phone] Relation: Son Secondary Emergency Contact: Alberty,Michael Address: [address]          [place], [place] [address] Darden Amber of [place] Home Phone: [phone] Mobile Phone: [phone] Relation: Son  Code Status:  DNR Goals of care: Advanced Directive information    04/21/2020   10:47 AM  Advanced Directives  Does Patient Have a Medical Advance Directive? No  Would patient like information on creating a medical advance directive? No - Patient declined     Chief Complaint  Patient presents with   detatched fingernail    HPI:  Pt is a 88 y.o. female seen today for an acute visit for patient's detached fingernail.  Patient is at 5 table and minimally interactive.  Patient only responds to discomfort with manipulation of the loose nail.   Past Medical History:  Diagnosis Date   Chronic back pain    Diverticulosis    GERD (gastroesophageal reflux disease)    Hyperlipidemia    Hypertension    Irritable bowel syndrome    Lumbago    Peripheral arterial disease (HCC)    Spinal stenosis of lumbar region with neurogenic claudication    Past Surgical History:  Procedure Laterality Date   APPENDECTOMY     BREAST BIOPSY Right 1980   neg   INTRAMEDULLARY (IM) NAIL INTERTROCHANTERIC Left 04/23/2020   Procedure: INTRAMEDULLARY (IM) NAIL INTERTROCHANTRIC;  Surgeon: Deeann Saint, MD;  Location: [facility] ORS;  Service: Orthopedics;  Laterality: Left;   ORIF FEMUR FRACTURE Left 07/19/2019   Procedure: OPEN REDUCTION INTERNAL FIXATION (ORIF) DISTAL FEMUR FRACTURE;   Surgeon: Lyndle Herrlich, MD;  Location: [facility] ORS;  Service: Orthopedics;  Laterality: Left;    Allergies  Allergen Reactions   Codeine Nausea And Vomiting   Garlic Other (See Comments)    indigestion   Milk-Related Compounds Diarrhea and Other (See Comments)    Diarrhea/cramping   Sorbitol Diarrhea and Other (See Comments)    Diarrhea/cramping    Outpatient Encounter Medications as of 02/18/2022  Medication Sig   acetaminophen (TYLENOL) 325 MG tablet Take 650 mg by mouth 3 (three) times daily.   acetaminophen (TYLENOL) 500 MG tablet Take 1,000 mg by mouth every 12 (twelve) hours as needed for mild pain (discomfort).   alum & mag hydroxide-simeth (MAALOX PLUS) 400-400-40 MG/5ML suspension Take 30 mLs by mouth every 4 (four) hours as needed for indigestion (gas/heartburn).   ARTIFICIAL TEAR SOLUTION OP Apply 1 drop to eye every 8 (eight) hours as needed (dry eyes).   aspirin EC 81 MG tablet Take 81 mg by mouth daily. Swallow whole.   bisacodyl (DULCOLAX) 10 MG suppository Place 10 mg rectally daily as needed for moderate constipation.   bisacodyl (DULCOLAX) 5 MG EC tablet Take 1 tablet (5 mg total) by mouth daily as needed for moderate constipation.   bismuth subsalicylate (PEPTO BISMOL) 262 MG/15ML suspension Take 30 mLs by mouth every 4 (four) hours as needed for diarrhea or loose stools.   cholecalciferol (VITAMIN D3) 10 MCG (400 UNIT) TABS tablet Take 400 Units by mouth daily.    clopidogrel (PLAVIX)  75 MG tablet TAKE 1 TABLET BY MOUTH DAILY (Patient taking differently: Take 75 mg by mouth daily.)   docusate sodium (COLACE) 100 MG capsule Take 1 capsule (100 mg total) by mouth 2 (two) times daily.   esomeprazole (NEXIUM) 20 MG capsule Take 20 mg by mouth daily.   ferrous sulfate 325 (65 FE) MG tablet Take 1 tablet (325 mg total) by mouth daily with breakfast.   gabapentin (NEURONTIN) 100 MG capsule Take 100 mg by mouth at bedtime.   metoprolol succinate (TOPROL-XL) 25 MG 24 hr tablet  Take 12.5 mg by mouth daily.   Multiple Vitamin (MULTIVITAMIN WITH MINERALS) TABS tablet Take 1 tablet by mouth daily. (Patient taking differently: Take 1 tablet by mouth at bedtime.)   Polyethyl Glycol-Propyl Glycol (SYSTANE) 0.4-0.3 % GEL ophthalmic gel Place 1 application into both eyes daily as needed (dry eyes).   polyethylene glycol (MIRALAX / GLYCOLAX) 17 g packet Take 17 g by mouth every other day.   polyethylene glycol (MIRALAX / GLYCOLAX) 17 g packet Take 17 g by mouth daily as needed for mild constipation.   Propylene Glycol (SYSTANE COMPLETE) 0.6 % SOLN Apply 1 drop to eye every 8 (eight) hours as needed (dry eyes).   sodium chloride (OCEAN) 0.65 % SOLN nasal spray Place 1 spray into both nostrils every 2 (two) hours as needed (dry nasal passages).   traMADol (ULTRAM) 50 MG tablet Take 1 tablet (50 mg total) by mouth every 6 (six) hours as needed for up to 6 doses for severe pain (pain/discomfort).   No facility-administered encounter medications on file as of 02/18/2022.    Review of Systems  Unable to perform ROS: Dementia    There is no immunization history for the selected administration types on file for this patient. Pertinent  Health Maintenance Due  Topic Date Due   DEXA SCAN  Never done   INFLUENZA VACCINE  12/09/2021      04/27/2020   11:00 AM 04/27/2020    9:00 PM 04/28/2020   10:00 AM 04/28/2020    8:45 PM 04/29/2020    7:48 AM  Fall Risk  Patient Fall Risk Level High fall risk High fall risk High fall risk High fall risk High fall risk   Functional Status Survey:    Vitals:   02/18/22 2123  BP: 126/64  Pulse: 94  Resp: 18  Temp: 97.6 F (36.4 C)  SpO2: 94%  Weight: 81 lb 3.2 oz (36.8 kg)   Body mass index is 16.4 kg/m. Physical Exam Constitutional:      Comments: Thin, cachectic, significant kyphosis.  Skin:    Comments: Fingernail of the right index finger nail plate completely detached from the nail bed.  Only attachment is at the cuticle  to the matrix.  Neurological:     Mental Status: She is alert.    Labs reviewed: No results for input(s): "NA", "K", "CL", "CO2", "GLUCOSE", "BUN", "CREATININE", "CALCIUM", "MG", "PHOS" in the last 8760 hours. No results for input(s): "AST", "ALT", "ALKPHOS", "BILITOT", "PROT", "ALBUMIN" in the last 8760 hours. No results for input(s): "WBC", "NEUTROABS", "HGB", "HCT", "MCV", "PLT" in the last 8760 hours. No results found for: "TSH" No results found for: "HGBA1C" No results found for: "CHOL", "HDL", "LDLCALC", "LDLDIRECT", "TRIG", "CHOLHDL"  Significant Diagnostic Results in last 30 days:  No results found.  Assessment/Plan 1. Onycholysis Patient with obvious history of onychomycosis of numerous fingernails.  Right index finger with notable separation of the nail down to the matrix.  Given no materials present for removal of the nail from the cuticle, we will plan to trim the nail to prevent nail from ripping at the base.  Plan to place a bandage over the finger to prevent further injury.  Of note patient is on hospice and comfort care and do not want to perform a procedure that would cause significant discomfort.    Family/ staff Communication: Nursing staff updated.  Labs/tests ordered: None  Tomasa Rand, MD, [facility] [phone]

## 2022-02-27 ENCOUNTER — Non-Acute Institutional Stay (SKILLED_NURSING_FACILITY): Admitting: Student

## 2022-02-27 ENCOUNTER — Encounter: Payer: Self-pay | Admitting: Student

## 2022-02-27 DIAGNOSIS — R600 Localized edema: Secondary | ICD-10-CM | POA: Diagnosis not present

## 2022-02-27 DIAGNOSIS — Z515 Encounter for palliative care: Secondary | ICD-10-CM | POA: Diagnosis not present

## 2022-02-27 DIAGNOSIS — L89152 Pressure ulcer of sacral region, stage 2: Secondary | ICD-10-CM | POA: Diagnosis not present

## 2022-02-27 DIAGNOSIS — Z66 Do not resuscitate: Secondary | ICD-10-CM | POA: Diagnosis not present

## 2022-02-27 DIAGNOSIS — E43 Unspecified severe protein-calorie malnutrition: Secondary | ICD-10-CM

## 2022-02-27 NOTE — Progress Notes (Signed)
Location:  Other Nursing Home Room Number: 418-A Place of Service:  SNF (31) Provider:  Dewayne Shorter, MD  Patient Care Team: Dewayne Shorter, MD as PCP - [facility])  Extended Emergency Contact Information Primary Emergency Contact: Mccamish,[name] Address: [place] [address] Johnnette Litter of [place] Phone: [phone] Mobile Phone: [phone] Relation: Son Secondary Emergency Contact: Duty,Michael Address: [place] [address] Johnnette Litter of [place] Phone: [phone] Mobile Phone: [phone] Relation: Son  Code Status:  DNR Goals of care: Advanced Directive information    02/27/2022    9:36 AM  Advanced Directives  Does Patient Have a Medical Advance Directive? Yes  Type of Paramedic of [place];Living will;Out of facility DNR (pink MOST or yellow form)  Does patient want to make changes to medical advance directive? No - Patient declined  Copy of [place] in Chart? Yes - validated most recent copy scanned in chart (See row information)  Pre-existing out of facility DNR order (yellow form or pink MOST form) Yellow form placed in chart (order not valid for inpatient use)     Chief Complaint  Patient presents with   Acute Visit    Leg swelling, vitals and medications are a reflection of [facility]MR system, [employer] [facility]      HPI:  Pt is a 88 y.o. female seen today for an acute visit for concern for sewlling in legs. Patient states she has no pain in her legs. She states she has pain with her sacral wound. She denies shortness of breath. She is eating well. She    Past Medical History:  Diagnosis Date   Chronic back pain    Diverticulosis    GERD (gastroesophageal reflux disease)    Hyperlipidemia    Hypertension    Irritable bowel syndrome    Lumbago    Peripheral arterial disease ([place])    Spinal stenosis of  lumbar region with neurogenic claudication    Past Surgical History:  Procedure Laterality Date   APPENDECTOMY     BREAST BIOPSY Right 1980   neg   INTRAMEDULLARY (IM) NAIL INTERTROCHANTERIC Left 04/23/2020   Procedure: INTRAMEDULLARY (IM) NAIL INTERTROCHANTRIC;  Surgeon: Earnestine Leys, MD;  Location: [facility] ORS;  Service: Orthopedics;  Laterality: Left;   ORIF FEMUR FRACTURE Left 07/19/2019   Procedure: OPEN REDUCTION INTERNAL FIXATION (ORIF) DISTAL FEMUR FRACTURE;  Surgeon: Lovell Sheehan, MD;  Location: [facility] ORS;  Service: Orthopedics;  Laterality: Left;    Allergies  Allergen Reactions   Codeine Nausea And Vomiting   Garlic Other (See Comments)    indigestion   Milk-Related Compounds Diarrhea and Other (See Comments)    Diarrhea/cramping   Sorbitol Diarrhea and Other (See Comments)    Diarrhea/cramping    Outpatient Encounter Medications as of 02/27/2022  Medication Sig   acetaminophen (TYLENOL) 325 MG tablet Take 650 mg by mouth 3 (three) times daily.   acetaminophen (TYLENOL) 325 MG tablet Take 650 mg by mouth every 4 (four) hours as needed.   alum & mag hydroxide-simeth (MAALOX PLUS) 400-400-40 MG/5ML suspension Take 30 mLs by mouth every 4 (four) hours as needed for indigestion (gas/heartburn).   ketoconazole (NIZORAL) 2 % shampoo Apply 1 Application topically 2 (two) times a week. On Tuesday and Friday   morphine (ROXANOL) 20 MG/ML concentrated solution Give 0.5 ml by  mouth every 1 hours as needed for PAIN, DYSPNEA, RESTLESSNESS. See other listing   morphine (ROXANOL) 20 MG/ML concentrated solution Give 0.25 ml by mouth every 1 hours as needed for PAIN, DYSPNEA, RESTLESSNESS   pantoprazole (PROTONIX) 20 MG tablet Take 20 mg by mouth daily.   polyvinyl alcohol (LIQUIFILM TEARS) 1.4 % ophthalmic solution Place 1 drop into both eyes every 8 (eight) hours as needed for dry eyes.   [DISCONTINUED] acetaminophen (TYLENOL) 500 MG tablet Take 1,000 mg by mouth every 12 (twelve) hours  as needed for mild pain (discomfort).   No facility-administered encounter medications on file as of 02/27/2022.    Review of Systems  All other systems reviewed and are negative.   Immunization History  Administered Date(s) Administered   Influenza Inj Mdck Quad Pf 03/10/2016, 03/01/2018, 02/10/2019   Influenza-Unspecified 02/26/2017   Pneumococcal Polysaccharide-23 01/26/2014   Pertinent  Health Maintenance Due  Topic Date Due   DEXA SCAN  Never done   INFLUENZA VACCINE  12/09/2021      04/27/2020   11:00 AM 04/27/2020    9:00 PM 04/28/2020   10:00 AM 04/28/2020    8:45 PM 04/29/2020    7:48 AM  Fall Risk  Patient Fall Risk Level High fall risk High fall risk High fall risk High fall risk High fall risk   Functional Status Survey:    Vitals:   02/27/22 0925  BP: 123/80  Pulse: 80  Temp: 97.6 F (36.4 C)  Weight: 81 lb 3.2 oz (36.8 kg)  Height: 4\' 11"  (1.499 m)   Body mass index is 16.4 kg/m. Physical Exam Constitutional:      Comments: Significant kyphosis   Musculoskeletal:     Comments: 1+ bilateral pitting edema  Skin:    General: Skin is warm and dry.     Comments: Unable to assess sacral wound  Neurological:     Mental Status: She is alert. Mental status is at baseline.     Labs reviewed: No results for input(s): "NA", "K", "CL", "CO2", "GLUCOSE", "BUN", "CREATININE", "CALCIUM", "MG", "PHOS" in the last 8760 hours. No results for input(s): "AST", "ALT", "ALKPHOS", "BILITOT", "PROT", "ALBUMIN" in the last 8760 hours. No results for input(s): "WBC", "NEUTROABS", "HGB", "HCT", "MCV", "PLT" in the last 8760 hours. No results found for: "TSH" No results found for: "HGBA1C" No results found for: "CHOL", "HDL", "LDLCALC", "LDLDIRECT", "TRIG", "CHOLHDL"  Significant Diagnostic Results in last 30 days:  No results found.  Assessment/Plan 1. Hospice care patient Followed by [facility]. No concerns. Comfortable Continues to feed herself. Weight  significantly down by 12.1% x 3 months.   2. DNR (do not resuscitate) Maintains this code status  3. Bilateral lower extremity edema Mild bilateral, symmetric pitting edema. Patient not endorsing pain. No tenderness with palpation. Given current goals of care are comfort measures will defer further work up at this time given no pain. Day nursing staff who is familiar with her have not noted this change before now. Discussed with them to continue to monitor for discomfort.   4. Stage II pressure ulcer of sacral region [facility]) Monitored by wound care nurse. Unable to assess today per patient's preference. Continue routine wound care and encourage protein sup  5. Severe protein-calorie malnutrition (HCC) Per nutritionist note "Weight significantly down by 12.1% x 3 months and 11.9% x 6 months. Resident has a current weight of 81.2lbs, weight verified 3x. Resident is receiving hospice services. Resident is not complaint with supplements and on an average consumes <  50% of her meals. Will attempt regular ice cream after meals. Monthly weights will continue r/t comfort care. RP (son) Perlie Stene and MD notified." From 02/25/2022.  Family/ staff Communication: none  Labs/tests ordered:  none  Coralyn Helling, MD, [facility] [phone]

## 2022-03-02 ENCOUNTER — Encounter: Payer: Self-pay | Admitting: Nurse Practitioner

## 2022-03-02 ENCOUNTER — Non-Acute Institutional Stay (SKILLED_NURSING_FACILITY): Payer: Medicare Other | Admitting: Nurse Practitioner

## 2022-03-02 DIAGNOSIS — L89890 Pressure ulcer of other site, unstageable: Secondary | ICD-10-CM

## 2022-03-02 NOTE — Progress Notes (Signed)
Location:  [facility] Room Number: [facility] [phone] Place of Service:  SNF (31)[place], [place], MD  Patient Care Team: Dewayne Shorter, MD as PCP - [facility])  Extended Emergency Contact Information Primary Emergency Contact: Essick,[name] Address: [place] [address] Johnnette Litter of [place] Phone: [phone] Mobile Phone: [phone] Relation: Son Secondary Emergency Contact: Ederer,Michael Address: [place] [address] Johnnette Litter of [place] Phone: [phone] Mobile Phone: [phone] Relation: Son  Goals of care: Advanced Directive information    02/27/2022    9:36 AM  Advanced Directives  Does Patient Have a Medical Advance Directive? Yes  Type of Paramedic of [place];Living will;Out of facility DNR (pink MOST or yellow form)  Does patient want to make changes to medical advance directive? No - Patient declined  Copy of [place] in Chart? Yes - validated most recent copy scanned in chart (See row information)  Pre-existing out of facility DNR order (yellow form or pink MOST form) Yellow form placed in chart (order not valid for inpatient use)     Chief Complaint  Patient presents with   Acute Visit    Pressure ulcer under chin    HPI:  Pt is a 88 y.o. female seen today for an acute visit for pressure ulcer under chin. Pt under hospice care. Dr Unk Lightning recently saw pt for worsening LE edema and pressure ulcer on sacrum.  Due to posture she holds her neck down and chin to chest. Staff noted redness with routine care and wound care nurse was consulted. They have started to clean areas on chin AND upper chest with normal saline. Apply a piece of calcium alginate on each area and cover with proximel dressings. Place foam in between chin and chest. Noted some redness under chin around pressure  injury.   Past Medical History:  Diagnosis Date   Chronic back pain    Diverticulosis    GERD (gastroesophageal reflux disease)    Hyperlipidemia    Hypertension    Irritable bowel syndrome    Lumbago    Peripheral arterial disease ([place])    Spinal stenosis of lumbar region with neurogenic claudication    Past Surgical History:  Procedure Laterality Date   APPENDECTOMY     BREAST BIOPSY Right 1980   neg   INTRAMEDULLARY (IM) NAIL INTERTROCHANTERIC Left 04/23/2020   Procedure: INTRAMEDULLARY (IM) NAIL INTERTROCHANTRIC;  Surgeon: Earnestine Leys, MD;  Location: [facility] ORS;  Service: Orthopedics;  Laterality: Left;   ORIF FEMUR FRACTURE Left 07/19/2019   Procedure: OPEN REDUCTION INTERNAL FIXATION (ORIF) DISTAL FEMUR FRACTURE;  Surgeon: Lovell Sheehan, MD;  Location: [facility] ORS;  Service: Orthopedics;  Laterality: Left;    Allergies  Allergen Reactions   Codeine Nausea And Vomiting   Garlic Other (See Comments)    indigestion   Milk-Related Compounds Diarrhea and Other (See Comments)    Diarrhea/cramping   Sorbitol Diarrhea and Other (See Comments)    Diarrhea/cramping    Outpatient Encounter Medications as of 03/02/2022  Medication Sig   acetaminophen (TYLENOL) 325 MG tablet Take 650 mg by mouth 3 (three) times daily.   acetaminophen (TYLENOL) 325 MG tablet Take 650 mg by mouth every 4 (four) hours as needed.   alum & mag hydroxide-simeth (MAALOX PLUS) 400-400-40 MG/5ML suspension Take 30 mLs  by mouth every 4 (four) hours as needed for indigestion (gas/heartburn).   ketoconazole (NIZORAL) 2 % shampoo Apply 1 Application topically 2 (two) times a week. On Tuesday and Friday   morphine (ROXANOL) 20 MG/ML concentrated solution Give 0.5 ml by mouth every 1 hours as needed for PAIN, DYSPNEA, RESTLESSNESS. See other listing   morphine (ROXANOL) 20 MG/ML concentrated solution Give 0.25 ml by mouth every 1 hours as needed for PAIN, DYSPNEA, RESTLESSNESS   pantoprazole (PROTONIX) 20 MG  tablet Take 20 mg by mouth daily.   polyvinyl alcohol (LIQUIFILM TEARS) 1.4 % ophthalmic solution Place 1 drop into both eyes every 8 (eight) hours as needed for dry eyes.   No facility-administered encounter medications on file as of 03/02/2022.    Review of Systems  Unable to perform ROS: Dementia    Immunization History  Administered Date(s) Administered   Influenza Inj Mdck Quad Pf 03/10/2016, 03/01/2018, 02/10/2019   Influenza-Unspecified 02/26/2017   Pneumococcal Polysaccharide-23 01/26/2014   Pertinent  Health Maintenance Due  Topic Date Due   DEXA SCAN  Never done   INFLUENZA VACCINE  12/09/2021      04/27/2020   11:00 AM 04/27/2020    9:00 PM 04/28/2020   10:00 AM 04/28/2020    8:45 PM 04/29/2020    7:48 AM  Fall Risk  Patient Fall Risk Level High fall risk High fall risk High fall risk High fall risk High fall risk   Functional Status Survey:    There were no vitals filed for this visit. There is no height or weight on file to calculate BMI. Physical Exam Constitutional:      Appearance: Normal appearance.  Pulmonary:     Effort: Pulmonary effort is normal.  Skin:    Comments: Quarter size pressure injury under chin with black base and redness surrounding area.   Neurological:     Mental Status: She is alert. Mental status is at baseline.     Labs reviewed: No results for input(s): "NA", "K", "CL", "CO2", "GLUCOSE", "BUN", "CREATININE", "CALCIUM", "MG", "PHOS" in the last 8760 hours. No results for input(s): "AST", "ALT", "ALKPHOS", "BILITOT", "PROT", "ALBUMIN" in the last 8760 hours. No results for input(s): "WBC", "NEUTROABS", "HGB", "HCT", "MCV", "PLT" in the last 8760 hours. No results found for: "TSH" No results found for: "HGBA1C" No results found for: "CHOL", "HDL", "LDLCALC", "LDLDIRECT", "TRIG", "CHOLHDL"  Significant Diagnostic Results in last 30 days:  No results found.  Assessment/Plan 1. Pressure injury of other site, unstageable  (HCC) -will continue wound care -start doxycycline 100 mg BID PO for 7 days  due to redness surrounding wound. -cont supportive care and supplements    Leeroy Lovings K. Biagio Borg [facility] [phone]

## 2022-03-11 ENCOUNTER — Non-Acute Institutional Stay (SKILLED_NURSING_FACILITY): Admitting: Student

## 2022-03-11 ENCOUNTER — Encounter: Payer: Self-pay | Admitting: Student

## 2022-03-11 DIAGNOSIS — Z515 Encounter for palliative care: Secondary | ICD-10-CM

## 2022-03-11 DIAGNOSIS — Z66 Do not resuscitate: Secondary | ICD-10-CM

## 2022-03-11 DIAGNOSIS — I509 Heart failure, unspecified: Secondary | ICD-10-CM

## 2022-03-11 DIAGNOSIS — E43 Unspecified severe protein-calorie malnutrition: Secondary | ICD-10-CM

## 2022-03-11 DIAGNOSIS — K219 Gastro-esophageal reflux disease without esophagitis: Secondary | ICD-10-CM

## 2022-03-11 DIAGNOSIS — L89152 Pressure ulcer of sacral region, stage 2: Secondary | ICD-10-CM

## 2022-03-11 DIAGNOSIS — I700 Atherosclerosis of aorta: Secondary | ICD-10-CM

## 2022-03-11 DIAGNOSIS — K5900 Constipation, unspecified: Secondary | ICD-10-CM

## 2022-03-11 DIAGNOSIS — I10 Essential (primary) hypertension: Secondary | ICD-10-CM

## 2022-03-11 NOTE — Progress Notes (Signed)
Location:  Other [facility]) Nursing Home Room Number: 418-A Place of Service:  SNF [phone]) Provider:  Earnestine Mealing, MD  Patient Care Team: Earnestine Mealing, MD as PCP - General [facility] Medicine)  Extended Emergency Contact Information Primary Emergency Contact: Kronick,[name] Address: [address]          [place], [place] [address] Darden Amber of [place] Home Phone: [phone] Mobile Phone: [phone] Relation: Son Secondary Emergency Contact: Dirusso,Michael Address: [address]          [place], [place] [address] Darden [place] of [place] Home Phone: [phone] Mobile Phone: [phone] Relation: Son  Code Status:  DNR Goals of care: Advanced Directive information    02/27/2022    9:36 AM  Advanced Directives  Does Patient Have a Medical Advance Directive? Yes  Type of Estate agent of [place];Living will;Out of facility DNR (pink MOST or yellow form)  Does patient want to make changes to medical advance directive? No - Patient declined  Copy of Healthcare Power of Attorney in Chart? Yes - validated most recent copy scanned in chart (See row information)  Pre-existing out of facility DNR order (yellow form or pink MOST form) Yellow form placed in chart (order not valid for inpatient use)     Chief Complaint  Patient presents with   Medical Management of Chronic Issues    Routine visit. Discuss need for covid boosters, td/tdap, shingrix, DEXA, and PCV vaccine or post pone if patient refuses or is not a candidate. Vitals and medications are a reflection of [facility], [employer] [facility]      HPI:  Pt is a 88 y.o. female seen today for medical management of chronic diseases.   Patient gives her name and date of birth. She says, "I'm sorry for all of the families that died in the war." She is not oriented to place or time. She denies pain, issues with urination, bowel movements.  Nursing and nutritionist state patient has lost  significant amount of weight. She is on hospice and followed by hospice nurse.   Past Medical History:  Diagnosis Date   Chronic back pain    Diverticulosis    GERD (gastroesophageal reflux disease)    Hyperlipidemia    Hypertension    Irritable bowel syndrome    Lumbago    Peripheral arterial disease (HCC)    Spinal stenosis of lumbar region with neurogenic claudication    Past Surgical History:  Procedure Laterality Date   APPENDECTOMY     BREAST BIOPSY Right 1980   neg   INTRAMEDULLARY (IM) NAIL INTERTROCHANTERIC Left 04/23/2020   Procedure: INTRAMEDULLARY (IM) NAIL INTERTROCHANTRIC;  Surgeon: Deeann Saint, MD;  Location: [facility] ORS;  Service: Orthopedics;  Laterality: Left;   ORIF FEMUR FRACTURE Left 07/19/2019   Procedure: OPEN REDUCTION INTERNAL FIXATION (ORIF) DISTAL FEMUR FRACTURE;  Surgeon: Lyndle Herrlich, MD;  Location: [facility] ORS;  Service: Orthopedics;  Laterality: Left;    Allergies  Allergen Reactions   Codeine Nausea And Vomiting   Garlic Other (See Comments)    indigestion   Milk-Related Compounds Diarrhea and Other (See Comments)    Diarrhea/cramping   Sorbitol Diarrhea and Other (See Comments)    Diarrhea/cramping    Outpatient Encounter Medications as of 03/11/2022  Medication Sig   acetaminophen (TYLENOL) 325 MG tablet Take 650 mg by mouth 3 (three) times daily.   acetaminophen (TYLENOL) 325 MG tablet Take 650 mg by mouth every 4 (four) hours as needed.  alum & mag hydroxide-simeth (MAALOX PLUS) 400-400-40 MG/5ML suspension Take 30 mLs by mouth every 4 (four) hours as needed for indigestion (gas/heartburn).   ketoconazole (NIZORAL) 2 % shampoo Apply 1 Application topically 2 (two) times a week. On Tuesday and Friday   Lactase (LACTAID PO) Take 1 tablet by mouth every 8 (eight) hours as needed.   morphine (ROXANOL) 20 MG/ML concentrated solution Give 0.5 ml by mouth every 1 hours as needed for PAIN, DYSPNEA, RESTLESSNESS. See other listing   morphine  (ROXANOL) 20 MG/ML concentrated solution Give 0.25 ml by mouth every 1 hours as needed for PAIN, DYSPNEA, RESTLESSNESS   pantoprazole (PROTONIX) 20 MG tablet Take 20 mg by mouth daily.   polyvinyl alcohol (LIQUIFILM TEARS) 1.4 % ophthalmic solution Place 1 drop into both eyes every 8 (eight) hours as needed for dry eyes.   No facility-administered encounter medications on file as of 03/11/2022.    Review of Systems  Unable to perform ROS: Dementia    Immunization History  Administered Date(s) Administered   Influenza Inj Mdck Quad Pf 03/10/2016, 03/01/2018, 02/10/2019   Influenza-Unspecified 02/26/2017   Moderna Covid-19 Vaccine Bivalent Booster 63yrs & up 10/07/2021   Moderna SARS-COV2 Booster Vaccination 09/27/2020   Moderna Sars-Covid-2 Vaccination 06/19/2019, 07/17/2019   Pfizer Covid-19 Vaccine Bivalent Booster 29yrs & up 01/31/2021   Pneumococcal Polysaccharide-23 01/26/2014   Pertinent  Health Maintenance Due  Topic Date Due   DEXA SCAN  Never done   INFLUENZA VACCINE  12/09/2021      04/27/2020   11:00 AM 04/27/2020    9:00 PM 04/28/2020   10:00 AM 04/28/2020    8:45 PM 04/29/2020    7:48 AM  Fall Risk  Patient Fall Risk Level High fall risk High fall risk High fall risk High fall risk High fall risk   Functional Status Survey:    Vitals:   03/11/22 0935  BP: (!) 147/83  Pulse: 73  Temp: (!) 97.3 F (36.3 C)  Weight: 81 lb 3.2 oz (36.8 kg)  Height: 4\' 11"  (1.499 m)   Body mass index is 16.4 kg/m. Physical Exam Constitutional:      Comments: Cachectic, severe kyphosis   Cardiovascular:     Rate and Rhythm: Normal rate.     Pulses: Normal pulses.  Pulmonary:     Effort: Pulmonary effort is normal.     Breath sounds: Normal breath sounds.  Neurological:     Mental Status: She is alert. Mental status is at baseline. She is disoriented.     Labs reviewed: No results for input(s): "NA", "K", "CL", "CO2", "GLUCOSE", "BUN", "CREATININE", "CALCIUM",  "MG", "PHOS" in the last 8760 hours. No results for input(s): "AST", "ALT", "ALKPHOS", "BILITOT", "PROT", "ALBUMIN" in the last 8760 hours. No results for input(s): "WBC", "NEUTROABS", "HGB", "HCT", "MCV", "PLT" in the last 8760 hours. No results found for: "TSH" No results found for: "HGBA1C" No results found for: "CHOL", "HDL", "LDLCALC", "LDLDIRECT", "TRIG", "CHOLHDL"  Significant Diagnostic Results in last 30 days:  No results found.  Assessment/Plan 1. Hospice care patient Patient has severe dementia with weightloss, baseline disorientation. She has been on hospice for sometime, given signfiicant drop in weight, concern that she is on the verge of declining quickly. PRN pain and agitation medication ordered with morphine and ativan.   2. Stage II pressure ulcer of sacral region (HCC) Stable. Continue care recommendations of wound care nurse.   3. DNR (do not resuscitate) Maintains this status.   4. Severe protein-calorie malnutrition (  [place]) Per nutrition, "Down 21.4% x 1 month (17.4#?? - significant), down 23.5% x 3 months (significant), down 28% x 6 months (significant). She was reweighed 6x. Declining. She is on hospice.  Does not accept supplements well. Staff to continue to offer regular ice cream after meals and prosource plus 2x/d for wound healing. "  5. Aortic atherosclerosis (HCC) No medications at this time given goals of care.   6. Essential hypertension, benign BP slightly elevated today. No meds per goals of care.   7. Congestive heart failure, unspecified HF chronicity, unspecified heart failure type ([place]) Appears euvolemic on exam. No diuresis at this time. CTM for respiratory distress.   8. Gastroesophageal reflux disease, unspecified whether esophagitis present Continue managing symptoms with pantoprazole 20 mg daily.   9. Constipation, unspecified constipation type PRN miralax and Maalox.     Family/ staff Communication: nursing  Labs/tests ordered:   none  Tomasa Rand, MD, [place] [phone]

## 2022-04-23 ENCOUNTER — Encounter: Payer: Self-pay | Admitting: Nurse Practitioner

## 2022-04-23 ENCOUNTER — Emergency Department

## 2022-04-23 ENCOUNTER — Telehealth: Payer: Medicare Other | Admitting: Student

## 2022-04-23 ENCOUNTER — Emergency Department
Admission: EM | Admit: 2022-04-23 | Discharge: 2022-04-24 | Disposition: A | Discharge status: Skilled Nursing Facility | Attending: Emergency Medicine | Admitting: Emergency Medicine

## 2022-04-23 ENCOUNTER — Non-Acute Institutional Stay (SKILLED_NURSING_FACILITY): Admitting: Nurse Practitioner

## 2022-04-23 ENCOUNTER — Encounter: Payer: Self-pay | Admitting: Radiology

## 2022-04-23 ENCOUNTER — Other Ambulatory Visit: Payer: Self-pay

## 2022-04-23 DIAGNOSIS — R519 Headache, unspecified: Secondary | ICD-10-CM | POA: Insufficient documentation

## 2022-04-23 DIAGNOSIS — W19XXXA Unspecified fall, initial encounter: Secondary | ICD-10-CM | POA: Diagnosis not present

## 2022-04-23 DIAGNOSIS — M21951 Unspecified acquired deformity of right thigh: Secondary | ICD-10-CM | POA: Insufficient documentation

## 2022-04-23 DIAGNOSIS — S8991XA Unspecified injury of right lower leg, initial encounter: Secondary | ICD-10-CM | POA: Diagnosis present

## 2022-04-23 DIAGNOSIS — R079 Chest pain, unspecified: Secondary | ICD-10-CM | POA: Insufficient documentation

## 2022-04-23 DIAGNOSIS — S72351A Displaced comminuted fracture of shaft of right femur, initial encounter for closed fracture: Secondary | ICD-10-CM | POA: Diagnosis not present

## 2022-04-23 DIAGNOSIS — R2991 Unspecified symptoms and signs involving the musculoskeletal system: Secondary | ICD-10-CM | POA: Diagnosis not present

## 2022-04-23 DIAGNOSIS — Y92129 Unspecified place in nursing home as the place of occurrence of the external cause: Secondary | ICD-10-CM | POA: Diagnosis not present

## 2022-04-23 DIAGNOSIS — R109 Unspecified abdominal pain: Secondary | ICD-10-CM | POA: Diagnosis not present

## 2022-04-23 LAB — CBC WITH DIFFERENTIAL/PLATELET
Abs Immature Granulocytes: 0.04 10*3/uL (ref 0.00–0.07)
Basophils Absolute: 0.1 10*3/uL (ref 0.0–0.1)
Basophils Relative: 1 %
Eosinophils Absolute: 0 10*3/uL (ref 0.0–0.5)
Eosinophils Relative: 0 %
HCT: 34.3 % — ABNORMAL LOW (ref 36.0–46.0)
Hemoglobin: 10.7 g/dL — ABNORMAL LOW (ref 12.0–15.0)
Immature Granulocytes: 0 %
Lymphocytes Relative: 8 %
Lymphs Abs: 0.9 10*3/uL (ref 0.7–4.0)
MCH: 27.8 pg (ref 26.0–34.0)
MCHC: 31.2 g/dL (ref 30.0–36.0)
MCV: 89.1 fL (ref 80.0–100.0)
Monocytes Absolute: 1 10*3/uL (ref 0.1–1.0)
Monocytes Relative: 9 %
Neutro Abs: 8.9 10*3/uL — ABNORMAL HIGH (ref 1.7–7.7)
Neutrophils Relative %: 82 %
Platelets: 390 10*3/uL (ref 150–400)
RBC: 3.85 MIL/uL — ABNORMAL LOW (ref 3.87–5.11)
RDW: 15.9 % — ABNORMAL HIGH (ref 11.5–15.5)
WBC: 10.9 10*3/uL — ABNORMAL HIGH (ref 4.0–10.5)
nRBC: 0 % (ref 0.0–0.2)

## 2022-04-23 LAB — BASIC METABOLIC PANEL
Anion gap: 5 (ref 5–15)
BUN: 25 mg/dL — ABNORMAL HIGH (ref 8–23)
CO2: 28 mmol/L (ref 22–32)
Calcium: 8.8 mg/dL — ABNORMAL LOW (ref 8.9–10.3)
Chloride: 107 mmol/L (ref 98–111)
Creatinine, Ser: 0.54 mg/dL (ref 0.44–1.00)
GFR, Estimated: >60 mL/min (ref >60)
Glucose, Bld: 116 mg/dL — ABNORMAL HIGH (ref 70–99)
Potassium: 4.5 mmol/L (ref 3.5–5.1)
Sodium: 140 mmol/L (ref 135–145)

## 2022-04-23 MED ORDER — FENTANYL CITRATE PF 50 MCG/ML IJ SOSY
50.0000 ug | PREFILLED_SYRINGE | Freq: Once | INTRAMUSCULAR | Status: AC
  Administered 2022-04-23: 50 ug via INTRAVENOUS
  Filled 2022-04-23: qty 1

## 2022-04-23 MED ORDER — TRAMADOL HCL 50 MG PO TABS: 50.0000 mg | ORAL_TABLET | Freq: Four times a day (QID) | ORAL | 0 refills | Status: DC | PRN

## 2022-04-23 MED ORDER — IOHEXOL 300 MG/ML  SOLN
100.0000 mL | Freq: Once | INTRAMUSCULAR | Status: AC | PRN
  Administered 2022-04-23: 50 mL via INTRAVENOUS

## 2022-04-23 NOTE — Progress Notes (Signed)
Location:  Other [facility]) Nursing Home Room Number: 418-A Place of Service:  SNF [phone]) Provider:  Janene Harvey. Janyth Contes, NP   Patient Care Team: Earnestine Mealing, MD as PCP - General [facility] Medicine)  Extended Emergency Contact Information Primary Emergency Contact: Lunz,[name] Address: [address]          [place], [place] [address] Darden Amber of [place] Home Phone: [phone] Mobile Phone: [phone] Relation: Son Secondary Emergency Contact: Chien,Michael Address: [address]          [place], [place] [address] Darden Amber of [place] Home Phone: [phone] Mobile Phone: [phone] Relation: Son  Code Status:  DNR Goals of care: Advanced Directive information    04/23/2022    2:29 PM  Advanced Directives  Does Patient Have a Medical Advance Directive? Yes  Type of Estate agent of [place];Living will;Out of facility DNR (pink MOST or yellow form)  Does patient want to make changes to medical advance directive? No - Patient declined  Copy of Healthcare Power of Attorney in Chart? Yes - validated most recent copy scanned in chart (See row information)  Pre-existing out of facility DNR order (yellow form or pink MOST form) Yellow form placed in chart (order not valid for inpatient use)     Chief Complaint  Patient presents with   Acute Visit    Right leg concerns. Vitals and medications are a reflection of [facility] system, [employer].     HPI:  Pt is a 88 y.o. female seen today for an acute visit for right leg concerns. Nursing reports CNA noted right upper leg appeared crooked on morning care.  There is no bruising or swelling noted.  Pt does not report pain. There was no injury or fall reports  Pt with advanced dementia and bed bound with contractures and requires total care.    Past Medical History:  Diagnosis Date   Chronic back pain    Diverticulosis    GERD (gastroesophageal reflux disease)     Hyperlipidemia    Hypertension    Irritable bowel syndrome    Lumbago    Peripheral arterial disease (HCC)    Spinal stenosis of lumbar region with neurogenic claudication    Past Surgical History:  Procedure Laterality Date   APPENDECTOMY     BREAST BIOPSY Right 1980   neg   INTRAMEDULLARY (IM) NAIL INTERTROCHANTERIC Left 04/23/2020   Procedure: INTRAMEDULLARY (IM) NAIL INTERTROCHANTRIC;  Surgeon: Deeann Saint, MD;  Location: [facility] ORS;  Service: Orthopedics;  Laterality: Left;   ORIF FEMUR FRACTURE Left 07/19/2019   Procedure: OPEN REDUCTION INTERNAL FIXATION (ORIF) DISTAL FEMUR FRACTURE;  Surgeon: Lyndle Herrlich, MD;  Location: [facility] ORS;  Service: Orthopedics;  Laterality: Left;    Allergies  Allergen Reactions   Codeine Nausea And Vomiting   Garlic Other (See Comments)    indigestion   Milk-Related Compounds Diarrhea and Other (See Comments)    Diarrhea/cramping   Sorbitol Diarrhea and Other (See Comments)    Diarrhea/cramping    Outpatient Encounter Medications as of 04/23/2022  Medication Sig   acetaminophen (TYLENOL) 325 MG tablet Take 650 mg by mouth every 4 (four) hours as needed.   alum & mag hydroxide-simeth (MAALOX PLUS) 400-400-40 MG/5ML suspension Take 30 mLs by mouth every 4 (four) hours as needed for indigestion (gas/heartburn).   ketoconazole (NIZORAL) 2 % shampoo Apply 1 Application topically 2 (two) times a week. On Tuesday and Friday   Lactase (LACTAID PO) Take 1  tablet by mouth every 8 (eight) hours as needed.   morphine (ROXANOL) 20 MG/ML concentrated solution Give 0.5 ml by mouth every 1 hours as needed for PAIN, DYSPNEA, RESTLESSNESS. See other listing   morphine (ROXANOL) 20 MG/ML concentrated solution Give 0.25 ml by mouth every 1 hours as needed for PAIN, DYSPNEA, RESTLESSNESS   polyvinyl alcohol (LIQUIFILM TEARS) 1.4 % ophthalmic solution Place 1 drop into both eyes every 8 (eight) hours as needed for dry eyes.   [DISCONTINUED] acetaminophen  (TYLENOL) 325 MG tablet Take 650 mg by mouth 3 (three) times daily.   [DISCONTINUED] pantoprazole (PROTONIX) 20 MG tablet Take 20 mg by mouth daily.   No facility-administered encounter medications on file as of 04/23/2022.    Review of Systems  Unable to perform ROS: Dementia    Immunization History  Administered Date(s) Administered   Influenza Inj Mdck Quad Pf 03/10/2016, 03/01/2018, 02/10/2019   Influenza, High Dose Seasonal PF 02/24/2022   Influenza-Unspecified 02/26/2017   Moderna Covid-19 Vaccine Bivalent Booster 2yrs & up 10/07/2021   Moderna SARS-COV2 Booster Vaccination 09/27/2020   Moderna Sars-Covid-2 Vaccination 06/19/2019, 07/17/2019   Pfizer Covid-19 Vaccine Bivalent Booster 38yrs & up 01/31/2021   Pneumococcal Polysaccharide-23 01/26/2014   Zoster, Live 10/14/2012   Pertinent  Health Maintenance Due  Topic Date Due   DEXA SCAN  Never done   INFLUENZA VACCINE  Completed      04/27/2020   11:00 AM 04/27/2020    9:00 PM 04/28/2020   10:00 AM 04/28/2020    8:45 PM 04/29/2020    7:48 AM  Fall Risk  Patient Fall Risk Level High fall risk High fall risk High fall risk High fall risk High fall risk   Functional Status Survey:    Vitals:   04/23/22 1410  BP: 96/60  Pulse: 68  Weight: 62 lb (28.1 kg)  Height: 4\' 11"  (1.499 m)   Body mass index is 12.52 kg/m. Physical Exam Constitutional:      Appearance: Normal appearance.  Pulmonary:     Effort: Pulmonary effort is normal.  Musculoskeletal:     Comments: Pt laying in bed, appears comfortable. Right leg with pillow for support. There is curvature to right upper leg noted. Tenderness on palpitation. No bruising or swelling noted.   Skin:    General: Skin is warm and dry.     Findings: No erythema.  Neurological:     Mental Status: She is alert. Mental status is at baseline.  Psychiatric:        Mood and Affect: Mood normal.     Labs reviewed: No results for input(s): "NA", "K", "CL", "CO2",  "GLUCOSE", "BUN", "CREATININE", "CALCIUM", "MG", "PHOS" in the last 8760 hours. No results for input(s): "AST", "ALT", "ALKPHOS", "BILITOT", "PROT", "ALBUMIN" in the last 8760 hours. No results for input(s): "WBC", "NEUTROABS", "HGB", "HCT", "MCV", "PLT" in the last 8760 hours. No results found for: "TSH" No results found for: "HGBA1C" No results found for: "CHOL", "HDL", "LDLCALC", "LDLDIRECT", "TRIG", "CHOLHDL"  Significant Diagnostic Results in last 30 days:  No results found.  Assessment/Plan 1. Abnormal leg finding Pt without acute pain or discomfort. No injury reported. -xay of the leg ordered.  Staff to monitor.   . Janene Harvey [facility] [phone]

## 2022-04-23 NOTE — Telephone Encounter (Addendum)
Received call from Director of Nursing that patient has deformed lower extremity. Patient has advanced dementia and a history of osteoporosis with numerous falls in the facility. Called ortho clinic and stated she can get into the clinic today at 3:45PMfor an urgent care visit. Concern that she will break the skin per nursing who sees her today (This Thereasa Parkin is remote at this time). Initial image of fracture taken out facility inserted below. Nursing plans to call patient's family to discuss options - concern that transferring could lead to complications with the fracture. Patient is not a good surgical candidate and is currently on hospice. She has DNR in place. She does not have a DNH in place. Family would like to stabilize the leg to make sure she can continue to receive care without further complications. Hope to have leg splinted and non-surgical management approach if possible.   Image: 2-view x-ray of right femur.   AP and lateral views were obtained. There is a severely displaced comminuted fracture of the mid to distal femur with associated soft tissue swelling. Joint spaces are aligned. There are no bony lesions. Mineralization is decreased. Marland Kitchen IMPRESSION: Acute fracture of the mid to distal femur.

## 2022-04-23 NOTE — ED Notes (Signed)
Applied a 12inch knee immobilizer at patient bedside with Dr. Derrill Kay. Per Dr. Derrill Kay we will medicate patient for pain and reapply a larger knee immobilizer.

## 2022-04-23 NOTE — Discharge Instructions (Signed)
Please keep an eye on the skin of her thigh to see if there is any erosion.

## 2022-04-23 NOTE — ED Triage Notes (Signed)
Pt presents from [facility]. Per EMS, pt was found in her bed. They are unclear how the pt got injured, but states the facility did an investigation and could not figure out the source of injury. EMS does not know if pt hit her head of what her baseline is. Per paperwork, pt is not on any blood thinners. Hx multiple fx. Pt is able to speak to nurse in triage, but is unable to answer any questions asked by triage nurse. Pt presents with bandage to chin and EMS states this is pressure ulcer. DNR.

## 2022-04-23 NOTE — ED Notes (Signed)
Assumed care of patient at this time. Report received from [place] , [place] . Patient is currently not in room; is in imaging.

## 2022-04-23 NOTE — ED Notes (Signed)
Called [facility] and gave report to Raliegh Ip, RN at this time.

## 2022-04-23 NOTE — ED Notes (Signed)
RN spoke with Kallie Edward, social worker on call for APS, to give information regarding the pt's condition and events that led to ED visit.

## 2022-04-23 NOTE — Progress Notes (Signed)
Orthopedic Tech Progress Note Patient Details:  Christina Mooney 1925/07/28 [id number]  Patient ID: Christina Mooney, female   DOB: 09-25-1925, 88 y.o.   MRN: [id number] I called order into [facility]. Trinna Post 04/23/2022, 8:53 PM

## 2022-04-23 NOTE — ED Provider Notes (Signed)
[facility] Provider Note    Event Date/Time   First MD Initiated Contact with Patient 04/23/22 1619     (approximate)   History   Leg Injury   HPI  Christina Mooney is a 88 y.o. female  who presents to the emergency department today because of concern for right femur fracture. Patient is unable to give any significant history. Did speak to nursing supervisors from her SNF who were present at bedside. They state that the patient had been complaining of some leg pain over the past couple of days but no obvious abnormality on exam. Today however staff noticed that she had deformity to her right upper leg so x-ray was obtained. The staff reviewed tape and did not see any obvious falls or trauma that would explain the fracture.       Physical Exam   Triage Vital Signs: ED Triage Vitals  Enc Vitals Group     BP 04/23/22 1615 (!) 115/53     Pulse Rate 04/23/22 1615 81     Resp 04/23/22 1615 16     Temp 04/23/22 1615 98.9 F (37.2 C)     Temp src --      SpO2 04/23/22 1615 98 %     Weight --      Height --      Head Circumference --      Peak Flow --      Pain Score 04/23/22 1620 0     Pain Loc --      Pain Edu? --      Excl. in GC? --     Most recent vital signs: Vitals:   04/23/22 1615  BP: (!) 115/53  Pulse: 81  Resp: 16  Temp: 98.9 F (37.2 C)  SpO2: 98%   General: Awake, alert, not oriented. CV:  Good peripheral perfusion. Regular rate and rhythm. Resp:  Normal effort.  Abd:  No distention.  Other:  Obvious deformity to the right upper leg.   ED Results / Procedures / Treatments   Labs (all labs ordered are listed, but only abnormal results are displayed) Labs Reviewed  CBC WITH DIFFERENTIAL/PLATELET  BASIC METABOLIC PANEL     EKG  None   RADIOLOGY I independently interpreted and visualized the right femur. My interpretation: mid shaft fracture Radiology interpretation:  IMPRESSION:  1. Comminuted, displaced and  angulated mid and distal femoral shaft  fracture. Nondisplaced component tracks distally, although  assessment for intra-articular involvement at the knee is limited on  the current exam.  2. Osteopenia/osteoporosis.    I independently interpreted and visualized the CT head. My interpretation: No acute bleed Radiology interpretation:  IMPRESSION:  1. No acute intracranial abnormality. No skull fracture.  2. Generalized atrophy and chronic small vessel ischemic change.    I independently interpreted and visualized the CT cervical spine. My interpretation: odontoid fracture Radiology interpretation: IMPRESSION: 1. Mildly posteriorly displaced and dorsally angulated fracture of the C2 odontoid process (AO Spine type A).   I, [name], personally discussed these images (ct cervical spine) and results by phone with the on-call radiologist and used this discussion as part of my medical decision making.   I independently interpreted and visualized the ct chest/abd/pel. My interpretation: No free air Radiology interpretation:  IMPRESSION:  1. Acute or subacute moderate-to-severe compression fractures of T3,  L3, and L4 vertebral bodies.  2. Acute or subacute nondisplaced fracture of the sacrum at S3.  3. Numerous bilateral rib fractures,  likely subacute or chronic.  4. Severe compression fracture of the T9 vertebral body with  vertebral plana morphology is chronic in appearance, but new from  2020.  5. Multiple non-acute-appearing fractures of the bony pelvis  including the right iliac wing and the bilateral superior and  inferior pubic rami.  6. No acute traumatic injury within the chest, abdomen, or pelvis.  7. Nonobstructing bilateral renal calculi.  8. Aortic and coronary artery atherosclerosis (ICD10-I70.0).      PROCEDURES:  Critical Care performed: No  Procedures   MEDICATIONS ORDERED IN ED: Medications - No data to display   IMPRESSION / MDM / ASSESSMENT AND  PLAN / ED COURSE  I reviewed the triage vital signs and the nursing notes.                              Differential diagnosis includes, but is not limited to, fracture, dislocation.  Patient's presentation is most consistent with acute presentation with potential threat to life or bodily function.  Patient presented to the emergency department today because of concerns for femur fracture.  On exam patient has obvious deformity to her right femur.  X-ray does confirm displaced and comminuted fracture.  Additionally given unknown trauma I did get further imaging of her head spine chest and abdomen.  Cervical spine CT was concerning for C2 fracture.  I discussed with Dr. Marcell Barlow with neurosurgery who evaluated the imaging.  At this time he thinks it is more likely a chronic finding and he did not recommend any immobilization.  CT imaging also showed subacute and chronic fractures of the ribs, spine and pelvis.  I had a discussion with both of the patient's son Christina Mooney over the telephone and 1 in person.  At this time they are not interested in pursuing surgery for the femur fracture.  Will try to immobilize with knee immobilizer.  When the nebulizer was placed I was able to manipulate the leg to help relieve some of the pressure of the bone fragment on the skin.  Did have a discussion with both sons about concern for possible skin compromise if the bone fragment were to start exerting pressure on the skin.  Also discussed potential for chronic pain and no weightbearing on that leg.  Will plan on discharging back to living facility.     FINAL CLINICAL IMPRESSION(S) / ED DIAGNOSES   Final diagnoses:  Closed displaced comminuted fracture of shaft of right femur, initial encounter [facility])    Note:  This document was prepared using Dragon voice recognition software and may include unintentional dictation errors.    [name], MD 04/23/22 2325

## 2022-04-24 ENCOUNTER — Non-Acute Institutional Stay (SKILLED_NURSING_FACILITY): Admitting: Student

## 2022-04-24 ENCOUNTER — Encounter: Payer: Self-pay | Admitting: Student

## 2022-04-24 DIAGNOSIS — Z515 Encounter for palliative care: Secondary | ICD-10-CM | POA: Diagnosis not present

## 2022-04-24 DIAGNOSIS — S7292XA Unspecified fracture of left femur, initial encounter for closed fracture: Secondary | ICD-10-CM

## 2022-04-24 DIAGNOSIS — M810 Age-related osteoporosis without current pathological fracture: Secondary | ICD-10-CM

## 2022-04-24 MED ORDER — OXYCODONE HCL 5 MG PO TABS: 2.5000 mg | ORAL_TABLET | ORAL | 0 refills | Status: AC | PRN

## 2022-04-24 NOTE — Progress Notes (Signed)
Location:  Other [facility].  Nursing Home Room Number: [place] [address]A Place of Service:  SNF (31) Provider:  Dr. Sherri Rad, MD  Patient Care Team: Earnestine Mealing, MD as PCP - General [facility] Medicine)  Extended Emergency Contact Information Primary Emergency Contact: Beirne,[name] Address: [address]          [place], [place] [address] D[place] of [place] Home Phone: [phone] Mobile Phone: [phone] Relation: Son Secondary Emergency Contact: Delduca,Michael Address: [address]          [place], [place] [address] D[place] of [place] Home Phone: [phone] Mobile Phone: [phone] Relation: Son  Code Status:  DNR Goals of care: Advanced Directive information    04/24/2022   10:17 AM  Advanced Directives  Does Patient Have a Medical Advance Directive? Yes  Type of Estate agent of [place];Out of facility DNR (pink MOST or yellow form)  Does patient want to make changes to medical advance directive? No - Patient declined  Copy of Healthcare Power of Attorney in Chart? Yes - validated most recent copy scanned in chart (See row information)     Chief Complaint  Patient presents with  . Hospitalization Follow-up    ER Follow up    HPI:  Pt is a 88 y.o. female seen today for an acute visit for    Past Medical History:  Diagnosis Date  . Chronic back pain   . Diverticulosis   . GERD (gastroesophageal reflux disease)   . Hyperlipidemia   . Hypertension   . Irritable bowel syndrome   . Lumbago   . Peripheral arterial disease (HCC)   . Spinal stenosis of lumbar region with neurogenic claudication    Past Surgical History:  Procedure Laterality Date  . APPENDECTOMY    . BREAST BIOPSY Right 1980   neg  . INTRAMEDULLARY (IM) NAIL INTERTROCHANTERIC Left 04/23/2020   Procedure: INTRAMEDULLARY (IM) NAIL INTERTROCHANTRIC;  Surgeon: Deeann Saint, MD;  Location: [facility] ORS;  Service: Orthopedics;   Laterality: Left;  . ORIF FEMUR FRACTURE Left 07/19/2019   Procedure: OPEN REDUCTION INTERNAL FIXATION (ORIF) DISTAL FEMUR FRACTURE;  Surgeon: Lyndle Herrlich, MD;  Location: [facility] ORS;  Service: Orthopedics;  Laterality: Left;    Allergies  Allergen Reactions  . Codeine Nausea And Vomiting  . Garlic Other (See Comments)    indigestion  . Milk-Related Compounds Diarrhea and Other (See Comments)    Diarrhea/cramping  . Sorbitol Diarrhea and Other (See Comments)    Diarrhea/cramping    Outpatient Encounter Medications as of 04/24/2022  Medication Sig  . acetaminophen (TYLENOL) 325 MG tablet Take 650 mg by mouth every 4 (four) hours as needed.  Marland Kitchen alum & mag hydroxide-simeth (MAALOX PLUS) 400-400-40 MG/5ML suspension Take 30 mLs by mouth every 4 (four) hours as needed for indigestion (gas/heartburn).  Marland Kitchen ketoconazole (NIZORAL) 2 % shampoo Apply 1 Application topically 2 (two) times a week. On Tuesday and Friday  . Lactase (LACTAID PO) Take 1 tablet by mouth every 8 (eight) hours as needed.  Marland Kitchen morphine (ROXANOL) 20 MG/ML concentrated solution Give 0.5 ml by mouth every 1 hours as needed for PAIN, DYSPNEA, RESTLESSNESS. See other listing  . morphine (ROXANOL) 20 MG/ML concentrated solution Give 0.25 ml by mouth every 1 hours as needed for PAIN, DYSPNEA, RESTLESSNESS  . polyvinyl alcohol (LIQUIFILM TEARS) 1.4 % ophthalmic solution Place 1 drop into both eyes every 8 (eight) hours as needed for dry eyes.  . [DISCONTINUED] traMADol (ULTRAM) 50  MG tablet Take 1 tablet (50 mg total) by mouth every 6 (six) hours as needed.   No facility-administered encounter medications on file as of 04/24/2022.    Review of Systems  Immunization History  Administered Date(s) Administered  . Influenza Inj Mdck Quad Pf 03/10/2016, 03/01/2018, 02/10/2019  . Influenza, High Dose Seasonal PF 02/24/2022  . Influenza-Unspecified 02/26/2017  . Moderna Covid-19 Vaccine Bivalent Booster 2368yrs & up 10/07/2021  .  Moderna SARS-COV2 Booster Vaccination 09/27/2020  . Moderna Sars-Covid-2 Vaccination 06/19/2019, 07/17/2019, 03/20/2022  . Research officer, trade [employer] Covid-19 Vaccine Bivalent Booster 242yrs & up 01/31/2021  . Pneumococcal Polysaccharide-23 01/26/2014  . Zoster, Live 10/14/2012   Pertinent  Health Maintenance Due  Topic Date Due  . DEXA SCAN  Never done  . INFLUENZA VACCINE  Completed      04/27/2020   11:00 AM 04/27/2020    9:00 PM 04/28/2020   10:00 AM 04/28/2020    8:45 PM 04/29/2020    7:48 AM  Fall Risk  Patient Fall Risk Level High fall risk High fall risk High fall risk High fall risk High fall risk   Functional Status Survey:    Vitals:   04/24/22 0955  BP: 120/66  Pulse: 96  Resp: 18  Temp: (!) 97.2 F (36.2 C)  SpO2: 94%  Weight: 62 lb 9.6 oz (28.4 kg)  Height: 4\' 11"  (1.499 m)   Body mass index is 12.64 kg/m. Physical Exam  Labs reviewed: Recent Labs    04/23/22 1650  NA 140  K 4.5  CL 107  CO2 28  GLUCOSE 116*  BUN 25*  CREATININE 0.54  CALCIUM 8.8*   No results for input(s): "AST", "ALT", "ALKPHOS", "BILITOT", "PROT", "ALBUMIN" in the last 8760 hours. Recent Labs    04/23/22 1650  WBC 10.9*  NEUTROABS 8.9*  HGB 10.7*  HCT 34.3*  MCV 89.1  PLT 390   No results found for: "TSH" No results found for: "HGBA1C" No results found for: "CHOL", "HDL", "LDLCALC", "LDLDIRECT", "TRIG", "CHOLHDL"  Significant Diagnostic Results in last 30 days:  CT Cervical Spine Wo Contrast  Result Date: 04/23/2022 CLINICAL DATA:  Fall EXAM: CT CERVICAL SPINE WITHOUT CONTRAST TECHNIQUE: Multidetector CT imaging of the cervical spine was performed without intravenous contrast. Multiplanar CT image reconstructions were also generated. RADIATION DOSE REDUCTION: This exam was performed according to the departmental dose-optimization program which includes automated exposure control, adjustment of the mA and/or kV according to patient size and/or use of iterative reconstruction  technique. COMPARISON:  04/30/2020 FINDINGS: Alignment: Reversal of normal lordotic curvature. Grade 1 anterolisthesis at C3-4. Skull base and vertebrae: There is a fracture of the C2 odontoid process with the superior fragment mildly posteriorly displaced and dorsally angulated. There is no other fracture. There is chronic non fusion of the anterior C1 ring. Soft tissues and spinal canal: No prevertebral fluid or swelling. No visible canal hematoma. Disc levels:  No spinal canal stenosis. Upper chest: Negative. Other: None. IMPRESSION: 1. Mildly posteriorly displaced and dorsally angulated fracture of the C2 odontoid process (AO Spine type A). Critical Value/emergent results were called by telephone at the time of interpretation on 04/23/2022 at 8:32 pm to provider [facility] , who verbally acknowledged these results. Electronically Signed   By: Deatra RobinsonKevin  Herman M.D.   On: 04/23/2022 20:32   CT CHEST ABDOMEN PELVIS W CONTRAST  Result Date: 04/23/2022 CLINICAL DATA:  Unwitnessed fall EXAM: CT CHEST, ABDOMEN, AND PELVIS WITH CONTRAST TECHNIQUE: Multidetector CT imaging of the chest, abdomen and pelvis  was performed following the standard protocol during bolus administration of intravenous contrast. RADIATION DOSE REDUCTION: This exam was performed according to the departmental dose-optimization program which includes automated exposure control, adjustment of the mA and/or kV according to patient size and/or use of iterative reconstruction technique. CONTRAST:  44mL OMNIPAQUE IOHEXOL 300 MG/ML  SOLN COMPARISON:  CT chest 02/27/2019. No prior abdominopelvic CT imaging FINDINGS: CT CHEST FINDINGS Cardiovascular: Heart size within normal limits. Thoracic aorta is nonaneurysmal. Scattered atherosclerotic calcifications of the aorta and coronary arteries. Central pulmonary vasculature is nondilated. Mediastinum/Nodes: No enlarged mediastinal, hilar, or axillary lymph nodes. Thyroid gland, trachea, and esophagus  demonstrate no significant findings. Lungs/Pleura: Mild bibasilar and lingular atelectasis. No pleural effusion or pneumothorax. Musculoskeletal: Severely exaggerated thoracic kyphosis. Severe compression fracture of the T3 vertebral body, which appears subacute or acute. Severe compression fracture of the T9 vertebral body with vertebral plana morphology is chronic in appearance, but new from 2020. Multiple healed sternal fractures. Numerous bilateral rib fractures, likely subacute or chronic. No chest wall hematoma or subcutaneous emphysema. CT ABDOMEN PELVIS FINDINGS Hepatobiliary: No hepatic injury or perihepatic hematoma. Gallbladder is unremarkable. Pancreas: Unremarkable. No pancreatic ductal dilatation or surrounding inflammatory changes. Spleen: No splenic injury or perisplenic hematoma. Adrenals/Urinary Tract: No adrenal hemorrhage. Nonobstructing renal calculi bilaterally. No evidence of renal injury or perinephric hematoma. No hydronephrosis. Urinary bladder within normal limits. Stomach/Bowel: No evidence of bowel obstruction or active bowel inflammation. Vascular/Lymphatic: Aortic atherosclerosis. No enlarged abdominal or pelvic lymph nodes. Reproductive: Uterus and bilateral adnexa are unremarkable. Other: No free fluid. No abdominopelvic fluid collection. No pneumoperitoneum. No abdominal wall hernia. Musculoskeletal: Acute or subacute moderate superior endplate compression fractures of L3 and L4. Chronic appearing bilateral L5 pars interarticularis defects with grade 1 anterolisthesis of L5 on S1. Acute or subacute nondisplaced fracture of the sacrum at S3. Partially healed nonacute fracture of the right iliac crest. Chronic bilateral superior and inferior pubic rami fractures. Prior left hip ORIF. IMPRESSION: 1. Acute or subacute moderate-to-severe compression fractures of T3, L3, and L4 vertebral bodies. 2. Acute or subacute nondisplaced fracture of the sacrum at S3. 3. Numerous bilateral rib  fractures, likely subacute or chronic. 4. Severe compression fracture of the T9 vertebral body with vertebral plana morphology is chronic in appearance, but new from 2020. 5. Multiple non-acute-appearing fractures of the bony pelvis including the right iliac wing and the bilateral superior and inferior pubic rami. 6. No acute traumatic injury within the chest, abdomen, or pelvis. 7. Nonobstructing bilateral renal calculi. 8. Aortic and coronary artery atherosclerosis (ICD10-I70.0). Electronically Signed   By: Duanne Guess D.O.   On: 04/23/2022 19:37   CT Head Wo Contrast  Result Date: 04/23/2022 CLINICAL DATA:  88 year old post unwitnessed fall at nursing home. EXAM: CT HEAD WITHOUT CONTRAST TECHNIQUE: Contiguous axial images were obtained from the base of the skull through the vertex without intravenous contrast. RADIATION DOSE REDUCTION: This exam was performed according to the departmental dose-optimization program which includes automated exposure control, adjustment of the mA and/or kV according to patient size and/or use of iterative reconstruction technique. COMPARISON:  Head CT 04/21/2020 FINDINGS: Brain: No evidence of acute infarction, hemorrhage, hydrocephalus, extra-axial collection or mass lesion/mass effect. Generalized atrophy with mild progression from 2021. Mild periventricular chronic small vessel ischemic change. Vascular: Atherosclerosis of skullbase vasculature without hyperdense vessel or abnormal calcification. Skull: No fracture or focal lesion. Sinuses/Orbits: No acute findings. Postsurgical change of both globes. Other: Cervical spine fractures assessed on concurrent cervical spine CT, reported separately. IMPRESSION:  1. No acute intracranial abnormality. No skull fracture. 2. Generalized atrophy and chronic small vessel ischemic change. Electronically Signed   By: Narda Rutherford M.D.   On: 04/23/2022 19:28   DG Femur Min 2 Views Right  Result Date: 04/23/2022 CLINICAL DATA:   Injury. EXAM: RIGHT FEMUR 2 VIEWS COMPARISON:  None Available. FINDINGS: Patient had difficulty with positioning due to contracture, non traditional images obtained. Comminuted, displaced and angulated mid and distal femoral shaft fracture. Apex posterior and lateral angulation of major fracture fragments. Nondisplaced component tracks distally, although assessment for intra-articular involvement is limited on the current exam. Cannot assess for joint effusion. The bones are diffusely under mineralized. Soft tissues appear a trophic. IMPRESSION: 1. Comminuted, displaced and angulated mid and distal femoral shaft fracture. Nondisplaced component tracks distally, although assessment for intra-articular involvement at the knee is limited on the current exam. 2. Osteopenia/osteoporosis. Electronically Signed   By: Narda Rutherford M.D.   On: 04/23/2022 17:34    Assessment/Plan There are no diagnoses linked to this encounter.   Family/ staff Communication: ***  Labs/tests ordered:  ***

## 2022-05-11 DEATH — deceased
